# Patient Record
Sex: Female | Born: 1940
Health system: Southern US, Community
[De-identification: ages and names within clinical notes are randomized; demographics above are authoritative.]

## PROBLEM LIST (undated history)

## (undated) DIAGNOSIS — K5730 Diverticulosis of large intestine without perforation or abscess without bleeding: Secondary | ICD-10-CM

## (undated) DIAGNOSIS — C73 Malignant neoplasm of thyroid gland: Secondary | ICD-10-CM

## (undated) DIAGNOSIS — E039 Hypothyroidism, unspecified: Secondary | ICD-10-CM

## (undated) DIAGNOSIS — I10 Essential (primary) hypertension: Secondary | ICD-10-CM

## (undated) DIAGNOSIS — K219 Gastro-esophageal reflux disease without esophagitis: Secondary | ICD-10-CM

## (undated) DIAGNOSIS — E785 Hyperlipidemia, unspecified: Secondary | ICD-10-CM

## (undated) DIAGNOSIS — K589 Irritable bowel syndrome without diarrhea: Secondary | ICD-10-CM

## (undated) DIAGNOSIS — Z86010 Personal history of colonic polyps: Secondary | ICD-10-CM

## (undated) DIAGNOSIS — I739 Peripheral vascular disease, unspecified: Secondary | ICD-10-CM

## (undated) HISTORY — DX: Essential (primary) hypertension: I10

## (undated) HISTORY — PX: ESOPHAGOGASTRODUODENOSCOPY: SHX1529

## (undated) HISTORY — DX: Diverticulosis of large intestine without perforation or abscess without bleeding: K57.30

## (undated) HISTORY — DX: Hypothyroidism, unspecified: E03.9

## (undated) HISTORY — DX: Gastro-esophageal reflux disease without esophagitis: K21.9

## (undated) HISTORY — DX: Personal history of colonic polyps: Z86.010

## (undated) HISTORY — DX: Irritable bowel syndrome, unspecified: K58.9

## (undated) HISTORY — DX: Hyperlipidemia, unspecified: E78.5

## (undated) HISTORY — DX: Peripheral vascular disease, unspecified: I73.9

## (undated) HISTORY — DX: Malignant neoplasm of thyroid gland: C73

---

## 1989-03-06 HISTORY — PX: ABDOMINAL HYSTERECTOMY: SHX81

## 1997-09-02 ENCOUNTER — Other Ambulatory Visit: Admission: RE | Admit: 1997-09-02 | Discharge: 1997-09-02 | Payer: Self-pay | Admitting: Obstetrics and Gynecology

## 1998-11-25 ENCOUNTER — Other Ambulatory Visit: Admission: RE | Admit: 1998-11-25 | Discharge: 1998-11-25 | Payer: Self-pay | Admitting: Obstetrics and Gynecology

## 1999-03-07 DIAGNOSIS — Z860100 Personal history of colon polyps, unspecified: Secondary | ICD-10-CM

## 1999-03-07 HISTORY — DX: Personal history of colonic polyps: Z86.010

## 1999-03-07 HISTORY — DX: Personal history of colon polyps, unspecified: Z86.0100

## 1999-03-22 ENCOUNTER — Encounter (INDEPENDENT_AMBULATORY_CARE_PROVIDER_SITE_OTHER): Payer: Self-pay | Admitting: Specialist

## 1999-03-22 ENCOUNTER — Other Ambulatory Visit: Admission: RE | Admit: 1999-03-22 | Discharge: 1999-03-22 | Payer: Self-pay | Admitting: Gastroenterology

## 2000-01-16 ENCOUNTER — Other Ambulatory Visit: Admission: RE | Admit: 2000-01-16 | Discharge: 2000-01-16 | Payer: Self-pay | Admitting: Obstetrics and Gynecology

## 2004-05-11 ENCOUNTER — Ambulatory Visit: Payer: Self-pay | Admitting: Gastroenterology

## 2004-07-01 ENCOUNTER — Ambulatory Visit: Payer: Self-pay | Admitting: Gastroenterology

## 2004-11-01 ENCOUNTER — Ambulatory Visit: Payer: Self-pay | Admitting: Cardiology

## 2005-03-13 ENCOUNTER — Ambulatory Visit: Payer: Self-pay | Admitting: Internal Medicine

## 2005-03-17 ENCOUNTER — Ambulatory Visit: Payer: Self-pay | Admitting: Internal Medicine

## 2005-09-29 ENCOUNTER — Ambulatory Visit: Payer: Self-pay | Admitting: Internal Medicine

## 2005-10-24 ENCOUNTER — Ambulatory Visit: Payer: Self-pay

## 2006-01-03 ENCOUNTER — Ambulatory Visit: Payer: Self-pay | Admitting: Internal Medicine

## 2006-01-31 ENCOUNTER — Ambulatory Visit: Payer: Self-pay | Admitting: Internal Medicine

## 2006-01-31 LAB — CONVERTED CEMR LAB
Crystals: NEGATIVE
Ketones, ur: NEGATIVE mg/dL
Specific Gravity, Urine: <=1.005 (ref 1.000–1.03)
Urine Glucose: NEGATIVE mg/dL
Urobilinogen, UA: 0.2 (ref 0.0–1.0)
pH: 6 (ref 5.0–8.0)

## 2006-04-23 ENCOUNTER — Ambulatory Visit: Payer: Self-pay | Admitting: Internal Medicine

## 2006-06-15 ENCOUNTER — Ambulatory Visit: Payer: Self-pay | Admitting: Cardiology

## 2006-06-26 ENCOUNTER — Ambulatory Visit: Payer: Self-pay

## 2006-06-27 ENCOUNTER — Ambulatory Visit: Payer: Self-pay | Admitting: Gastroenterology

## 2006-07-05 ENCOUNTER — Ambulatory Visit (HOSPITAL_COMMUNITY): Admission: RE | Admit: 2006-07-05 | Discharge: 2006-07-05 | Payer: Self-pay | Admitting: Gastroenterology

## 2006-07-12 ENCOUNTER — Ambulatory Visit: Payer: Self-pay | Admitting: Cardiology

## 2006-08-02 ENCOUNTER — Encounter (INDEPENDENT_AMBULATORY_CARE_PROVIDER_SITE_OTHER): Payer: Self-pay | Admitting: Gastroenterology

## 2006-08-02 ENCOUNTER — Ambulatory Visit: Payer: Self-pay | Admitting: Gastroenterology

## 2007-05-02 ENCOUNTER — Encounter: Payer: Self-pay | Admitting: Internal Medicine

## 2007-05-30 ENCOUNTER — Ambulatory Visit: Payer: Self-pay | Admitting: Internal Medicine

## 2007-05-30 DIAGNOSIS — E785 Hyperlipidemia, unspecified: Secondary | ICD-10-CM

## 2007-05-30 DIAGNOSIS — I739 Peripheral vascular disease, unspecified: Secondary | ICD-10-CM

## 2007-05-30 DIAGNOSIS — K219 Gastro-esophageal reflux disease without esophagitis: Secondary | ICD-10-CM

## 2007-05-30 DIAGNOSIS — I10 Essential (primary) hypertension: Secondary | ICD-10-CM | POA: Insufficient documentation

## 2007-05-31 LAB — CONVERTED CEMR LAB
AST: 25 units/L (ref 0–37)
Albumin: 4 g/dL (ref 3.5–5.2)
Alkaline Phosphatase: 55 units/L (ref 39–117)
BUN: 9 mg/dL (ref 6–23)
Chloride: 103 meq/L (ref 96–112)
Cholesterol: 126 mg/dL (ref 0–200)
GFR calc non Af Amer: 76 mL/min
Glucose, Bld: 100 mg/dL — ABNORMAL HIGH (ref 70–99)
Potassium: 4.2 meq/L (ref 3.5–5.1)
Total Protein: 6.8 g/dL (ref 6.0–8.3)

## 2007-06-20 ENCOUNTER — Encounter: Payer: Self-pay | Admitting: Internal Medicine

## 2007-11-19 ENCOUNTER — Ambulatory Visit: Payer: Self-pay | Admitting: Internal Medicine

## 2007-11-19 ENCOUNTER — Ambulatory Visit: Payer: Self-pay

## 2007-11-26 ENCOUNTER — Ambulatory Visit: Payer: Self-pay | Admitting: Internal Medicine

## 2008-03-06 HISTORY — PX: COLONOSCOPY: SHX174

## 2008-03-06 HISTORY — PX: THYROIDECTOMY: SHX17

## 2008-03-19 ENCOUNTER — Ambulatory Visit (HOSPITAL_COMMUNITY): Admission: RE | Admit: 2008-03-19 | Discharge: 2008-03-20 | Payer: Self-pay | Admitting: Obstetrics and Gynecology

## 2008-05-12 ENCOUNTER — Encounter: Payer: Self-pay | Admitting: Internal Medicine

## 2008-05-13 ENCOUNTER — Encounter: Payer: Self-pay | Admitting: Internal Medicine

## 2008-05-27 ENCOUNTER — Ambulatory Visit: Payer: Self-pay | Admitting: Internal Medicine

## 2008-07-30 ENCOUNTER — Encounter: Payer: Self-pay | Admitting: Internal Medicine

## 2008-11-24 ENCOUNTER — Ambulatory Visit: Payer: Self-pay | Admitting: Internal Medicine

## 2008-11-24 DIAGNOSIS — Z860100 Personal history of colon polyps, unspecified: Secondary | ICD-10-CM | POA: Insufficient documentation

## 2008-11-26 LAB — CONVERTED CEMR LAB
Albumin: 4.3 g/dL (ref 3.5–5.2)
BUN: 10 mg/dL (ref 6–23)
Basophils Absolute: 0 10*3/uL (ref 0.0–0.1)
Bilirubin Urine: NEGATIVE
CO2: 29 meq/L (ref 19–32)
Chloride: 106 meq/L (ref 96–112)
Cholesterol: 213 mg/dL — ABNORMAL HIGH (ref 0–200)
Eosinophils Absolute: 0.1 10*3/uL (ref 0.0–0.7)
GFR calc non Af Amer: 75.81 mL/min (ref >60)
Glucose, Bld: 101 mg/dL — ABNORMAL HIGH (ref 70–99)
HCT: 43.9 % (ref 36.0–46.0)
HDL: 39.6 mg/dL (ref >39.00)
Lymphs Abs: 1.5 10*3/uL (ref 0.7–4.0)
MCHC: 33.7 g/dL (ref 30.0–36.0)
MCV: 89.7 fL (ref 78.0–100.0)
Monocytes Absolute: 0.4 10*3/uL (ref 0.1–1.0)
Neutro Abs: 4.1 10*3/uL (ref 1.4–7.7)
Nitrite: NEGATIVE
Platelets: 163 10*3/uL (ref 150.0–400.0)
Potassium: 4.2 meq/L (ref 3.5–5.1)
RDW: 13 % (ref 11.5–14.6)
Total Protein, Urine: NEGATIVE mg/dL
Total Protein: 7.2 g/dL (ref 6.0–8.3)
Urine Glucose: NEGATIVE mg/dL
VLDL: 25 mg/dL (ref 0.0–40.0)
Vitamin B-12: 547 pg/mL (ref 211–911)
pH: 6.5 (ref 5.0–8.0)

## 2008-12-02 DIAGNOSIS — Z85850 Personal history of malignant neoplasm of thyroid: Secondary | ICD-10-CM | POA: Insufficient documentation

## 2008-12-25 ENCOUNTER — Ambulatory Visit: Payer: Self-pay | Admitting: Internal Medicine

## 2009-01-05 ENCOUNTER — Ambulatory Visit: Payer: Self-pay | Admitting: Internal Medicine

## 2009-01-05 LAB — HM COLONOSCOPY

## 2009-03-06 DIAGNOSIS — C73 Malignant neoplasm of thyroid gland: Secondary | ICD-10-CM

## 2009-03-06 HISTORY — DX: Malignant neoplasm of thyroid gland: C73

## 2009-05-13 ENCOUNTER — Encounter: Payer: Self-pay | Admitting: Internal Medicine

## 2009-08-06 ENCOUNTER — Encounter: Payer: Self-pay | Admitting: Internal Medicine

## 2009-12-23 ENCOUNTER — Encounter: Payer: Self-pay | Admitting: Internal Medicine

## 2010-03-16 ENCOUNTER — Encounter: Payer: Self-pay | Admitting: Internal Medicine

## 2010-04-03 LAB — CONVERTED CEMR LAB
ALT: 32 units/L (ref 0–35)
Bilirubin, Direct: 0.1 mg/dL (ref 0.0–0.3)
CO2: 28 meq/L (ref 19–32)
Calcium: 8.9 mg/dL (ref 8.4–10.5)
GFR calc non Af Amer: 76 mL/min
HDL: 36.9 mg/dL — ABNORMAL LOW (ref >39.0)
Sodium: 142 meq/L (ref 135–145)
Total Bilirubin: 0.7 mg/dL (ref 0.3–1.2)
Total CHOL/HDL Ratio: 5.2

## 2010-04-07 NOTE — Letter (Signed)
Summary: [facility]   Imported By: Sherian Rein 09/08/2009 15:03:13  _____________________________________________________________________  External Attachment:    Type:   Image     Comment:   External Document

## 2010-04-07 NOTE — Letter (Signed)
Summary: [facility]   Imported By: Sherian Rein 01/20/2010 10:40:55  _____________________________________________________________________  External Attachment:    Type:   Image     Comment:   External Document

## 2010-05-09 ENCOUNTER — Other Ambulatory Visit: Payer: Medicare Other

## 2010-05-09 ENCOUNTER — Encounter (INDEPENDENT_AMBULATORY_CARE_PROVIDER_SITE_OTHER): Payer: Self-pay | Admitting: *Deleted

## 2010-05-09 ENCOUNTER — Other Ambulatory Visit: Payer: Self-pay | Admitting: Internal Medicine

## 2010-05-09 DIAGNOSIS — Z0000 Encounter for general adult medical examination without abnormal findings: Secondary | ICD-10-CM

## 2010-05-09 DIAGNOSIS — E039 Hypothyroidism, unspecified: Secondary | ICD-10-CM

## 2010-05-09 DIAGNOSIS — E785 Hyperlipidemia, unspecified: Secondary | ICD-10-CM

## 2010-05-09 DIAGNOSIS — I10 Essential (primary) hypertension: Secondary | ICD-10-CM

## 2010-05-09 LAB — CBC WITH DIFFERENTIAL/PLATELET
Basophils Relative: 0.3 % (ref 0.0–3.0)
Eosinophils Relative: 1.4 % (ref 0.0–5.0)
HCT: 39 % (ref 36.0–46.0)
Hemoglobin: 13.6 g/dL (ref 12.0–15.0)
Lymphs Abs: 0.7 10*3/uL (ref 0.7–4.0)
MCV: 90.7 fl (ref 78.0–100.0)
Monocytes Absolute: 0.4 10*3/uL (ref 0.1–1.0)
Monocytes Relative: 9.2 % (ref 3.0–12.0)
Neutro Abs: 3.1 10*3/uL (ref 1.4–7.7)
RBC: 4.3 Mil/uL (ref 3.87–5.11)
WBC: 4.2 10*3/uL — ABNORMAL LOW (ref 4.5–10.5)

## 2010-05-09 LAB — URINALYSIS
Bilirubin Urine: NEGATIVE
Ketones, ur: NEGATIVE
Leukocytes, UA: NEGATIVE
pH: 6 (ref 5.0–8.0)

## 2010-05-09 LAB — TSH: TSH: 0.02 u[IU]/mL — ABNORMAL LOW (ref 0.35–5.50)

## 2010-05-09 LAB — BASIC METABOLIC PANEL
BUN: 11 mg/dL (ref 6–23)
Chloride: 103 mEq/L (ref 96–112)
GFR: 88.06 mL/min (ref >60.00)
Potassium: 4.1 mEq/L (ref 3.5–5.1)
Sodium: 138 mEq/L (ref 135–145)

## 2010-05-09 LAB — LIPID PANEL
Cholesterol: 182 mg/dL (ref 0–200)
LDL Cholesterol: 122 mg/dL — ABNORMAL HIGH (ref 0–99)
VLDL: 20.8 mg/dL (ref 0.0–40.0)

## 2010-05-09 LAB — HEPATIC FUNCTION PANEL
ALT: 26 U/L (ref 0–35)
AST: 28 U/L (ref 0–37)
Albumin: 4.1 g/dL (ref 3.5–5.2)
Total Bilirubin: 0.9 mg/dL (ref 0.3–1.2)
Total Protein: 6.7 g/dL (ref 6.0–8.3)

## 2010-05-13 ENCOUNTER — Encounter (INDEPENDENT_AMBULATORY_CARE_PROVIDER_SITE_OTHER): Payer: Medicare Other | Admitting: Internal Medicine

## 2010-05-13 ENCOUNTER — Encounter: Payer: Self-pay | Admitting: Internal Medicine

## 2010-05-13 DIAGNOSIS — E039 Hypothyroidism, unspecified: Secondary | ICD-10-CM | POA: Insufficient documentation

## 2010-05-13 DIAGNOSIS — Z0000 Encounter for general adult medical examination without abnormal findings: Secondary | ICD-10-CM

## 2010-05-24 NOTE — Assessment & Plan Note (Signed)
Summary: yearly exam   Vital Signs:  Patient profile:   70 year old female Height:      66 inches Weight:      159 pounds BMI:     25.76 Temp:     98.3 degrees F oral Pulse rate:   76 / minute Pulse rhythm:   regular Resp:     16 per minute BP sitting:   108 / 52  (left arm) Cuff size:   regular  Vitals Entered By: Lanier Prude, Beverly Gust) (May 13, 2010 2:11 PM) CC: MWV Is Patient Diabetic? No   CC:  MWV.  History of Present Illness: The patient presents for a preventive health examination  Patient past medical history, social history, and family history reviewed in detail no significant changes.  Patient is physically active. Depression is negative and mood is good. Hearing is normal, and able to perform activities of daily living. Risk of falling is negligible and home safety has been reviewed and is appropriate. Patient has normal height, weight, and her visual acuity is good w/glasses. Patient has been counseled on age-appropriate routine health concerns for screening and prevention. Education, counseling done. Cognition is nl  Preventive Screening-Counseling & Management  Alcohol-Tobacco     Alcohol drinks/day: 0     Smoking Status: quit     Year Started: 1960     Year Quit: 1968  Caffeine-Diet-Exercise     Caffeine use/day: 4     Does Patient Exercise: yes     Type of exercise: walking     Exercise (avg: min/session): 3     Depression Counseling: not indicated; screening negative for depression  Hep-HIV-STD-Contraception     Hepatitis Risk: no risk noted     Dental Visit-last 6 months yes     SBE monthly: yes     Sun Exposure-Excessive: no  Safety-Violence-Falls     Seat Belt Use: yes     Helmet Use: n/a     Firearms in the Home: no firearms in the home     Smoke Detectors: yes     Violence in the Home: no risk noted     Sexual Abuse: no     Fall Risk: none      Sexual History:  currently monogamous.    Current Medications (verified): 1)  Baby  Aspirin 81 Mg  Chew (Aspirin) 2)  Atenolol 25 Mg  Tabs (Atenolol) .... Take 1 Tablet By Mouth Once A Day 3)  Vitamin B-12 1000 Mcg  Tabs (Cyanocobalamin) 4)  Vitamin D3 1000 Unit  Tabs (Cholecalciferol) .Marland Kitchen.. 1 By Mouth Daily 5)  Prevacid 30 Mg Cpdr (Lansoprazole) .... One By Mouth Daily 6)  Lovaza 1 Gm Caps (Omega-3-Acid Ethyl Esters) .... Once Daily 7)  Crestor 20 Mg Tabs (Rosuvastatin Calcium) .Marland Kitchen.. 1 Tablet By Mouth Daily 8)  Synthroid 175 Mcg Tabs (Levothyroxine Sodium) .Marland Kitchen.. 1 By Mouth Once Daily  Allergies (verified): 1)  ! * Flu Vaccination  Past History:  Family History: Last updated: 05/30/2007 Family History Hypertension  Social History: Last updated: 11/24/2008  Married Former Smoker Retired at 72  Past Medical History: Hyperlipidemia Peripheral vascular disease Hypertension GERD GYN Michelle Colonic polyps, hx of Hypothyroidism 2011 Thyroid cancer 2011  Past Surgical History: Thyroidectomy and 131I treatment 2011 at [facility]  Social History: Caffeine use/day:  4 Does Patient Exercise:  yes Dental Care w/in 6 mos.:  yes Sun Exposure-Excessive:  no Seat Belt Use:  yes Fall Risk:  none Hepatitis Risk:  no risk noted Sexual History:  currently monogamous  Review of Systems  The patient denies anorexia, fever, weight loss, weight gain, vision loss, decreased hearing, hoarseness, chest pain, syncope, dyspnea on exertion, peripheral edema, prolonged cough, headaches, hemoptysis, abdominal pain, melena, hematochezia, severe indigestion/heartburn, hematuria, incontinence, genital sores, muscle weakness, suspicious skin lesions, transient blindness, difficulty walking, depression, unusual weight change, abnormal bleeding, enlarged lymph nodes, angioedema, and breast masses.    Physical Exam  General:  Well-developed,well-nourished,in no acute distress; alert,appropriate and cooperative throughout examination Eyes:  No corneal or conjunctival  inflammation noted. EOMI. Perrla. Funduscopic exam benign, without hemorrhages, exudates or papilledema. Vision grossly normal. Nose:  External nasal examination shows no deformity or inflammation. Nasal mucosa are pink and moist without lesions or exudates. Mouth:  Erythematous throat mucosa and intranasal erythema.  Neck:  No deformities, masses, or tenderness noted. Lungs:  Normal respiratory effort, chest expands symmetrically. Lungs are clear to auscultation, no crackles or wheezes. Heart:  RRR Abdomen:  Bowel sounds positive,abdomen soft and non-tender without masses, organomegaly or hernias noted. Msk:  No deformity or scoliosis noted of thoracic or lumbar spine.   Extremities:  No clubbing, cyanosis, edema, or deformity noted with normal full range of motion of all joints.   Neurologic:  No cranial nerve deficits noted. Station and gait are normal. Plantar reflexes are down-going bilaterally. DTRs are symmetrical throughout. Sensory, motor and coordinative functions appear intact. Skin:  Intact without suspicious lesions or rashes Psych:  Cognition and judgment appear intact. Alert and cooperative with normal attention span and concentration. No apparent delusions, illusions, hallucinations   Impression & Recommendations:  Problem # 1:  PHYSICAL EXAMINATION (ICD-V70.0) Assessment New  Overall doing well, age appropriate education and counseling updated and referral for appropriate preventive services done unless declined, immunizations up to date or declined, diet counseling done if overweight, urged to quit smoking if smokes, most recent labs reviewed and current ordered if appropriate, ecg reviewed or declined (interpretation per ECG scanned in the EMR if done); information regarding Medicare Preventation requirements given if appropriate.  I have personally reviewed the Medicare Annual Wellness questionnaire and have noted 1.   The patient's medical and social history 2.   Their use  of alcohol, tobacco or illicit drugs 3.   Their current medications and supplements 4.   The patient's functional ability including ADL's, fall risks, home safety risks and hearing or visual             impairment. 5.   Diet and physical activities 6.   Evidence for depression or mood disorders  The patients weight, height, BMI and visual acuity have been recorded in the chart I have made referrals, counseling and provided education to the patient based review of the above and I have provided the pt with a written personalized care plan for preventive services.    Orders: Medicare -1st Annual Wellness Visit [phone])  Problem # 2:  HYPOTHYROIDISM (ICD-244.9) Assessment: New  The following medications were removed from the medication list:    Synthroid 175 Mcg Tabs (Levothyroxine sodium) .Marland Kitchen... 1 by mouth once daily Her updated medication list for this problem includes:    Synthroid 125 Mcg Tabs (Levothyroxine sodium) .Marland Kitchen... 1 by mouth qd  Problem # 3:  HYPERTENSION (ICD-401.9) Assessment: Improved  Her updated medication list for this problem includes:    Atenolol 25 Mg Tabs (Atenolol) .Marland Kitchen... Take 1 tablet by mouth once a day  Problem # 4:  GERD (ICD-530.81) Assessment: Unchanged  Her  updated medication list for this problem includes:    Prevacid 30 Mg Cpdr (Lansoprazole) ..... One by mouth daily  Complete Medication List: 1)  Baby Aspirin 81 Mg Chew (Aspirin) 2)  Atenolol 25 Mg Tabs (Atenolol) .... Take 1 tablet by mouth once a day 3)  Vitamin B-12 1000 Mcg Tabs (Cyanocobalamin) 4)  Vitamin D3 1000 Unit Tabs (Cholecalciferol) .Marland Kitchen.. 1 by mouth daily 5)  Prevacid 30 Mg Cpdr (Lansoprazole) .... One by mouth daily 6)  Lovaza 1 Gm Caps (Omega-3-acid ethyl esters) .... Once daily 7)  Crestor 20 Mg Tabs (Rosuvastatin calcium) .Marland Kitchen.. 1 tablet by mouth daily 8)  Synthroid 125 Mcg Tabs (Levothyroxine sodium) .Marland Kitchen.. 1 by mouth qd  Patient Instructions: 1)  Please schedule a follow-up  appointment in 2 months. 2)  TSH prior to visit, ICD-9:244.80 Prescriptions: CRESTOR 20 MG TABS (ROSUVASTATIN CALCIUM) 1 tablet by mouth daily  #30 x 11   Entered and Authorized by:   Tresa Garter MD   Signed by:   Tresa Garter MD on 05/19/2010   Method used:   Print then Give to Patient   RxID:   [id number] SYNTHROID 125 MCG TABS (LEVOTHYROXINE SODIUM) 1 by mouth qd  #30 x 6   Entered and Authorized by:   Tresa Garter MD   Signed by:   Tresa Garter MD on 05/19/2010   Method used:   Print then Give to Patient   RxID:   [id number]    Orders Added: 1)  Medicare -1st Annual Wellness Visit [G0438] 2)  Est. Patient Level III [32440]   Zostavax (to be given today)

## 2010-05-30 ENCOUNTER — Encounter: Payer: Self-pay | Admitting: Internal Medicine

## 2010-06-20 LAB — CBC
Hemoglobin: 14.1 g/dL (ref 12.0–15.0)
MCHC: 33.7 g/dL (ref 30.0–36.0)
MCHC: 33.9 g/dL (ref 30.0–36.0)
Platelets: 171 10*3/uL (ref 150–400)
RBC: 4.07 MIL/uL (ref 3.87–5.11)
RDW: 13.1 % (ref 11.5–15.5)
RDW: 13.2 % (ref 11.5–15.5)

## 2010-07-05 ENCOUNTER — Other Ambulatory Visit: Payer: Medicare Other

## 2010-07-05 ENCOUNTER — Other Ambulatory Visit (INDEPENDENT_AMBULATORY_CARE_PROVIDER_SITE_OTHER): Payer: Medicare Other

## 2010-07-05 DIAGNOSIS — E038 Other specified hypothyroidism: Secondary | ICD-10-CM

## 2010-07-05 LAB — TSH: TSH: 0.02 u[IU]/mL — ABNORMAL LOW (ref 0.35–5.50)

## 2010-07-06 ENCOUNTER — Other Ambulatory Visit: Payer: Self-pay | Admitting: Internal Medicine

## 2010-07-06 DIAGNOSIS — E038 Other specified hypothyroidism: Secondary | ICD-10-CM

## 2010-07-13 ENCOUNTER — Ambulatory Visit: Payer: Medicare Other | Admitting: Internal Medicine

## 2010-07-19 NOTE — Assessment & Plan Note (Signed)
[facility]                            CARDIOLOGY OFFICE NOTE   HITZEMAN, Poppy D                         MRN:          [id number]  DATE:07/12/2006                            DOB:          02/25/1941    Ms. Colson comes back today to discuss her exertional chest tightness.  We performed a stress Myoview on June 26, 2006.  She exercised for 4  minutes and 31 seconds, reaching a peak heart rate of 141.  This is 91%  of her predicted maximum heart rate.  Met level achieved was 6.4.  Her  EF was 77%, no ischemia, normal wall motion.  She did have a short run  of PAT, which was asymptomatic.  She had no significant ST-segment  changes.   She is having an endoscopy with [name] next week.  She is under  a lot of stress with her father being ill and having to be put in a  facility.   Her meds are unchanged.  Her blood pressure is good today at 116/68, her pulse 61 and regular.  The rest of her exam in unchanged.   ASSESSMENT AND PLAN:  Ms. Lavalle is having noncardiac chest discomfort.  Much of her symptoms have gotten better on reflux therapy.  She will  follow up with [name].  I will plan on seeing her back on a  p.r.n. basis.  I have emphasized walking at least 3 hours a week for  stress reduction and therapeutic lifestyle changes.     Thomas C. Daleen Squibb, MD, [facility]  Electronically Signed    TCW/MedQ  DD: 07/12/2006  DT: 07/12/2006  Job #: [id number]   cc:   Ulyess Mort, MD

## 2010-07-19 NOTE — H&P (Signed)
NAMEMachele Mcgee, Jared                  ACCOUNT NO.:  [id number]   MEDICAL RECORD NO.:  [id number]          PATIENT TYPE:  AMB   LOCATION:  SDC                           FACILITY:  [facility]   PHYSICIAN:  Michelle L. Grewal, M.D.DATE OF BIRTH:  14-Jan-1941   DATE OF ADMISSION:  DATE OF DISCHARGE:                              HISTORY & PHYSICAL   This is a 70 year old gravida 2, para 2 who has had a hysterectomy and  presents today preop.  She has a known symptomatic cystocele.  She  reports that she feels a bulge in her vagina at times.  We had pessary  in the past and it did not work.  She has had a history of TAH with BSO  by Dr. Dewaine Conger in 1971.  She does have a history of UTIs and recently  last week came in complaining of strong odor in her urine and her urine  grew out E. Coli.  She has been placed on ciprofloxacin.  She has no  known allergies.   MEDICAL PROBLEMS:  She has none.   SURGERY:  TAH with BSO.   CURRENT MEDICATIONS:  1. Atenolol 25 mg.  2. Ciprofloxacin.  3. She is also on Lexapro 10 mg a day.   PHYSICAL EXAMINATION:  VITAL SIGNS:  Height 5 feet 5 inches, weight 169,  and BP 128/74.  LUNGS:  Clear to auscultation bilaterally.  CARDIAC:  Regular rate and rhythm.  BREASTS:  Soft and nontender.  No masses.  PELVIC:  She has a grade 2 cystocele noted.  Vaginal cuff is okay.  She  has good support posteriorly.  No pelvic masses.   IMPRESSION:  Symptomatic cystocele.   PLAN:  Anterior repair, possible posterior repair if she demonstrates a  lot of relaxation in the operating room.  Risks and benefits were  reviewed with the patient.  We will use a Foley catheter overnight as  well as vaginal packing overnight and plan for an overnight hospital  stay.  The risks and benefits were reviewed with the patient.  We will  continue ciprofloxacin while in the hospital.      Marcelino Duster L. Vincente Poli, M.D.  Electronically Signed     MLG/MEDQ  D:  03/16/2008  T:  03/17/2008  Job:   [id number]

## 2010-07-19 NOTE — Op Note (Signed)
NAMETerecia Mcgee, [name]                  ACCOUNT NO.:  [id number]   MEDICAL RECORD NO.:  [id number]          PATIENT TYPE:  OIB   LOCATION:  9302                          FACILITY:  [facility]   PHYSICIAN:  Michelle L. Grewal, M.D.DATE OF BIRTH:  Oct 15, 1940   DATE OF PROCEDURE:  03/19/2008  DATE OF DISCHARGE:                               OPERATIVE REPORT   PREOPERATIVE DIAGNOSES:  Symptomatic cystocele and pelvic relaxation.   POSTOPERATIVE DIAGNOSES:  Symptomatic cystocele and pelvic relaxation.   PROCEDURE:  Anterior repair of incidental cystotomy and perineoplasty.   SURGEON:  Michelle L. Vincente Poli, MD   ANESTHESIA:  Spinal with LMA.   ESTIMATED BLOOD LOSS:  Less than 100 mL.   COMPLICATIONS:  Small incidental cystotomy repaired without difficulty.   PROCEDURE:  The patient was taken to the operating room where spinal was  placed without difficulty.  She was then given LMA as well for  additional anesthesia.  The patient was placed in lithotomy position.  Exam under anesthesia revealed the patient was status post hysterectomy.  She had a grade 2-3 cystocele.  She had some relaxation of the perineum,  but she had good support of the posterior vaginal wall.  She most likely  needed anterior repair and perineoplasty.  The patient was prepped and  draped and a Foley catheter was inserted.  The weighted speculum was  placed in the vagina.  The apex was identified at the vagina.  Allis  clamps were placed on either side.  A small transverse incision was made  with a scalpel.  A midline incision was made on the anterior repair  across the anterior vaginal vault with Metzenbaum scissors.  I did note  that there was significant scar tissue of the bladder to the vaginal  cuff.  In the bladder and that portion of where the scar tissue was, was  extremely translucent and very thin.  The cystocele was then gently  reduced using sharp and blunt dissection laterally.  With sharp  dissection with  Metzenbaum scissors, the scar tissues between the  bladder and the vaginal cuff was gently released.  During that time, I  noted what appeared to be a very tiny small cystotomy less than the tips  of my Metzenbaum scissors at this area.  I feel that it was the  cystotomy because there was some drainage of yellowish fluid, which  appeared to be consistent with urine.  The hole was so small, a hemostat  would not fit through this area, it was probably about 3-4 mm in length.  I then instructed the anesthesiologist to administer IV indigo carmine.  I then used 3-0 Vicryl suture and closed the defect completely with 2  layers with figure-of-eight stitch.  The indigo carmine was then given  and once the urine turned blue and there was absolutely no leakage  through this that I noted, the repair was completely watertight.  I feel  that the small cystotomy was probably because the bladder was extremely  translucent and thin because of the scar tissue in the area.  So I did  not take down the cystocele any further in the back because I was afraid  a larger cystotomy might occur because the bladder was so thin; however,  I was able to take it down pretty well laterally and then I reduced the  cystocele by reapproximating the tissue in the midline using 2-0 Vicryl  suture interrupted.  This was done in 1 layer, I then trimmed the  redundant vaginal tissue and closed the vaginal epithelium using 0-  Vicryl in a running locked stitch.  Hemostasis was excellent.  Reapproximation was very good.  At this point, inspection of posterior  vaginal wall noted that she had good support posteriorly and the vaginal  apex; however, she did have some relaxation of the perineum, and I  proceeded with a perineoplasty by making a small midline incision about  2.5 cm up the posterior vaginal vault reducing the defect with sharp  dissection and then placing re-enforcing stitch sutures using 0-Vicryl  suture.  I then  closed the vaginal epithelium posteriorly with a running  stitch using 0-Vicryl running stitch, then closed the perineum as well  with a subcuticular.  At the end of the procedure, no bleeding was  noted.  Vaginal packing with Estrace cream was inserted to the vagina.  Because of the small cystotomy and because of how thin her bladder was,  I feel that she needs to have complete decompression of her bladder for  least a week.  We will keep the Foley catheter in for 1 week and  maintain her on ciprofloxacin that were Floxin this well and I feel that  she will have excellent result.  All sponge, lap, and instrument counts  were correct x2.  She went to recovery room in stable condition.      Michelle L. Vincente Poli, M.D.  Electronically Signed     MLG/MEDQ  D:  03/19/2008  T:  03/20/2008  Job:  [id number]

## 2010-07-22 NOTE — Assessment & Plan Note (Signed)
[facility]                            CARDIOLOGY OFFICE NOTE   Mcgee, Lauren D                         MRN:          [id number]  DATE:06/15/2006                            DOB:          [birth year]    [name] comes to me today to establish as her cardiologist.  She has  seen [name] in the past.   She is best friends with [name], a patient of mine.   She carries a diagnosis of vasovagal syncope from abdominal pain.  She  has been on low-dose atenolol for this.  Had a stress Myoview February 03, 2002, which was normal with an EF of 67%.   She has also had carotid Dopplers, which showed 40-59% bilateral  internal carotid artery stenosis.  This was October 24, 2005.  These were  obtained by [name].  She is asymptomatic.   About the last week of March, she was sitting at home about 7 p.m. and  had substernal chest pressure.  She passed out for about 15 seconds.  She was clear when she returned to consciousness.  She was taken to  [facility] emergency room where she was diagnosed with GERD.  She  was placed on generic Protonix.   She has had some exertional chest tightness when she walks up hills.   CURRENT MEDICATIONS:  1. Atenolol 25 mg a day.  2. Aspirin 81 mg a day.  3. Protonix 40 mg a day.   EXAM:  She is extremely pleasant.  Looks younger than her stated age.  Blood pressure is 126/76, pulse 69 and regular.  EKG shows sinus rhythm  with no ST segment changes.  There are no conduction delays.  Weight is  162.  HEENT:  Normocephalic, atraumatic.  PERRLA.  Extraocular muscles are  intact.  Sclerae clear.  Facial symmetry is normal.  Dentition is  satisfactory.  NECK:  Supple.  Carotids are full with soft systolic sounds bilaterally.  Thyroid is not enlarged.  Trachea is midline.  LUNGS:  Clear.  HEART:  Nondisplaced PMI.  She has a normal S1, S2 without murmur, rub,  or gallop.  ABDOMEN:  Soft with good  bowel sounds.  No midline bruit.  There is no  hepatomegaly.  EXTREMITIES:  No cyanosis, clubbing, or edema.  Pulses are intact.  NEURO:  Intact.   [name] has had recurrent syncope from chest discomfort.  This is  most likely vasovagal as she has had before.  She probably does have  some reflux.  She is scheduled to see [name] for followup.  I  have told her to stay on her Protonix.   Her exertional dyspnea and chest tightness bothers me.  It has been 5  years since she has had a stress test.  She does have carotid disease.  We will obtain an exercise rest-stress Myoview to rule out any  substantial obstructive coronary artery disease.   She will need followup with me in about 3 to 4 weeks to discuss these  tests.  A  year from now, she will need carotid Dopplers.     Thomas C. Daleen Squibb, MD, [facility]  Electronically Signed    TCW/MedQ  DD: 06/15/2006  DT: 06/15/2006  Job #: [id number]

## 2010-07-22 NOTE — Assessment & Plan Note (Signed)
[facility]                             PRIMARY CARE OFFICE NOTE   Kylah, Maresh Yamira D                         MRN:          [id number]  DATE:01/03/2006                            DOB:          17-Feb-1941    The patient is a 70 year old female who presents for a new Medicare patient  wellness.   PAST MEDICAL HISTORY/FAMILY HISTORY/SOCIAL HISTORY:  As per March 17, 2005, note.  Her father is 37 years old, living.   CURRENT MEDICATIONS:  Fish oil and niacin.   ALLERGIES:  None.   REVIEW OF SYSTEMS:  No chest pain or shortness of breath.  No syncope.  No  neurologic complaints.  Occasional leg cramps.   PHYSICAL EXAMINATION:  VITAL SIGNS:  Blood pressure 119/68.  Pulse 61.  Temperature 97.7.  Weight 163 pounds.  GENERAL:  She is in no acute distress, looks well.  HEENT:  Moist mucosa.  NECK:  Supple.  No thyromegaly or bruits.  LUNGS:  There are no wheezes or rales.  HEART:  Normal S1, S2.  No murmurs, no gallops.  ABDOMEN:  Soft, nontender.  No hepatosplenomegaly or mass.  LOWER EXTREMITIES:  Without edema.  Calves nontender.  Good peripheral  pulse.  NEURO:  She is alert and oriented.  Denies being depressed.  SKIN:  Clear with aging changes.   LABS:  Not available at this time.   ASSESSMENT AND PLAN:  1. Normal well examination.  Obtain lab work appropriate for age.      Mammogram yearly.  Schedule yearly Pap with gynecologist.  2. Dyslipidemia.  Will try Zocor 20 mg daily, obtain lipids and liver in      two months.  3. Cramps from niacin.  She will discontinue.  4. Peripheral vascular disease with carotid artery with mild stenosis.      Continue with aspirin 81 mg daily.  I will see her back in 2-3 months.  5. Menopause.  Obtain vitamin D level.  Continue with calcium and vitamin      D.  Obtain bone density scan.    ______________________________  Georgina Quint. Plotnikov, MD    AVP/MedQ  DD: 01/04/2006  DT: 01/04/2006  Job #:  [id number]

## 2010-07-22 NOTE — Assessment & Plan Note (Signed)
[facility]                         GASTROENTEROLOGY OFFICE NOTE   Mcgee, Lauren Mcgee                         MRN:          [id number]  DATE:06/27/2006                            DOB:          1940-11-19    [name] comes in after going to the hospital in [place] and was diagnosed  with gastroesophageal reflux disease, pain. She suffered some severe  epigastric pain recently that caused a syncopal episode. The doctors  there thought it might be related to GERD. I told her that I had not  experienced GERD causing a syncopal episode like this unless it involved  severe pain. Maybe with some nutcracker type spasms or DES with severe  spasms, but again I have never had a patient pass out from this kind of  thing. She does have a history of syncope secondary to vasodepressor  syncope, which certainly is probably the culprit here. She did have an  episodes of SVT in the past. There has been a question of Bezold-Jarisch  reflux and therefore she should try to avoid recurrent episodes of  abdominal pain that might trigger this. This was suggested to her by Dr.  Andee Lineman. She continues on atenolol and Protonix. She said she would like  to have a colonoscopic examination at the same time and she was advised,  since her ultrasound did not show anything at the hospital in [place]  any pathology and that a HIDA scan would be in order. She says she has  lot of acid reflux and has had this for quite some time. She had a  Cardiolite study yesterday, she indicates was negative.   PHYSICAL EXAMINATION:  She weighed 162, blood pressure 112/54. Pulse 70  and regular.  NECK __________ are unremarkable.  She has no pain on palpation of her abdomen. She had only slight pain on  palpation of the epigastric region, none in the right upper quadrant.   IMPRESSION:  1. Epigastric pain lasting only 10 seconds, resulting in syncope of      questionable etiology.  2.  Gastroesophageal reflux disease. It is possible this is esophageal      spasm associated.  3. Diverticulosis. The patient has very elongated tortuous colon and      has been symptomatic from this in the past.  4. Vasodepressor syncope.  5. History of asymptomatic supraventricular tachycardia.  6. Irritable bowel syndrome.   RECOMMENDATIONS:  Dr. Andee Lineman has talked to the patient in the past, all  the way back to 2003, about Bezold-Jarisch reflux associated with  abdominal pain and has told her to try to eliminate her constipation as  much as possible and that she needs to prevent these situations from  occurring. Her pain was so fleeting and so severe that it is hard to  believe that it is associated with this syncope, but it certainly is  possible.   My recommendation is that she get a HIDA scan and be scheduled for an  EGD, but I do not think that a followup colonoscopic examination was in  order since it was last done in 2006.  I wrote down at that time that a 7  year followup was all that was needed.     Ulyess Mort, MD  Electronically Signed    SML/MedQ  DD: 06/27/2006  DT: 06/27/2006  Job #: [id number]   cc:   Learta Codding, MD,FACC

## 2010-08-15 ENCOUNTER — Ambulatory Visit: Payer: Medicare Other | Admitting: Internal Medicine

## 2010-08-15 ENCOUNTER — Ambulatory Visit: Payer: Medicare Other

## 2010-08-18 ENCOUNTER — Other Ambulatory Visit: Payer: Self-pay | Admitting: Internal Medicine

## 2010-08-26 ENCOUNTER — Ambulatory Visit: Payer: Medicare Other | Admitting: Internal Medicine

## 2010-10-11 ENCOUNTER — Other Ambulatory Visit: Payer: Self-pay | Admitting: Internal Medicine

## 2010-11-10 ENCOUNTER — Ambulatory Visit (INDEPENDENT_AMBULATORY_CARE_PROVIDER_SITE_OTHER): Payer: Medicare Other | Admitting: *Deleted

## 2010-11-10 DIAGNOSIS — Z23 Encounter for immunization: Secondary | ICD-10-CM

## 2010-11-10 DIAGNOSIS — Z2911 Encounter for prophylactic immunotherapy for respiratory syncytial virus (RSV): Secondary | ICD-10-CM

## 2011-01-04 ENCOUNTER — Other Ambulatory Visit: Payer: Self-pay | Admitting: Internal Medicine

## 2011-06-13 ENCOUNTER — Other Ambulatory Visit: Payer: Self-pay | Admitting: Internal Medicine

## 2011-06-14 ENCOUNTER — Telehealth: Payer: Self-pay | Admitting: Internal Medicine

## 2011-06-14 NOTE — Telephone Encounter (Signed)
Lvmom to schedule appt.

## 2011-06-20 ENCOUNTER — Encounter: Payer: Self-pay | Admitting: Internal Medicine

## 2011-06-20 ENCOUNTER — Ambulatory Visit (INDEPENDENT_AMBULATORY_CARE_PROVIDER_SITE_OTHER): Payer: Medicare Other | Admitting: Internal Medicine

## 2011-06-20 VITALS — BP 130/70 | HR 84 | Temp 98.4°F | Resp 16 | Wt 160.0 lb

## 2011-06-20 DIAGNOSIS — E785 Hyperlipidemia, unspecified: Secondary | ICD-10-CM

## 2011-06-20 DIAGNOSIS — R202 Paresthesia of skin: Secondary | ICD-10-CM

## 2011-06-20 DIAGNOSIS — I10 Essential (primary) hypertension: Secondary | ICD-10-CM

## 2011-06-20 DIAGNOSIS — Z0000 Encounter for general adult medical examination without abnormal findings: Secondary | ICD-10-CM

## 2011-06-20 DIAGNOSIS — I739 Peripheral vascular disease, unspecified: Secondary | ICD-10-CM

## 2011-06-20 DIAGNOSIS — E559 Vitamin D deficiency, unspecified: Secondary | ICD-10-CM

## 2011-06-20 DIAGNOSIS — R209 Unspecified disturbances of skin sensation: Secondary | ICD-10-CM

## 2011-06-20 DIAGNOSIS — E039 Hypothyroidism, unspecified: Secondary | ICD-10-CM

## 2011-06-20 MED ORDER — ATENOLOL 25 MG PO TABS: 25.0000 mg | ORAL_TABLET | Freq: Every day | ORAL | Status: DC

## 2011-06-20 NOTE — Assessment & Plan Note (Signed)
Continue with current prescription therapy as reflected on the Med list.  

## 2011-06-20 NOTE — Progress Notes (Signed)
  Subjective:    Patient ID: Lauren Mcgee, female    DOB: [birth year], 71 y.o.   MRN: [id number]  HPI  The patient presents for a follow-up of  chronic hypertension, chronic dyslipidemia, PVD, hypothyroidism controlled with medicines  Wt Readings from Last 3 Encounters:  06/20/11 160 lb (72.576 kg)  05/13/10 159 lb (72.122 kg)  11/24/08 167 lb (75.751 kg)   BP Readings from Last 3 Encounters:  06/20/11 130/70  05/13/10 108/52  11/24/08 126/78        Review of Systems  Constitutional: Negative for chills, activity change, appetite change, fatigue and unexpected weight change.  HENT: Negative for congestion, mouth sores and sinus pressure.   Eyes: Negative for visual disturbance.  Respiratory: Negative for cough and chest tightness.   Gastrointestinal: Negative for nausea and abdominal pain.  Genitourinary: Negative for frequency, difficulty urinating and vaginal pain.  Musculoskeletal: Negative for back pain and gait problem.  Skin: Negative for pallor and rash.  Neurological: Negative for dizziness, tremors, weakness, numbness and headaches.  Psychiatric/Behavioral: Negative for suicidal ideas, confusion and sleep disturbance.       Objective:   Physical Exam  Constitutional: She appears well-developed. No distress.  HENT:  Head: Normocephalic.  Right Ear: External ear normal.  Left Ear: External ear normal.  Nose: Nose normal.  Mouth/Throat: Oropharynx is clear and moist.  Eyes: Conjunctivae are normal. Pupils are equal, round, and reactive to light. Right eye exhibits no discharge. Left eye exhibits no discharge.  Neck: Normal range of motion. Neck supple. No JVD present. No tracheal deviation present. No thyromegaly present.  Cardiovascular: Normal rate, regular rhythm and normal heart sounds.   Pulmonary/Chest: No stridor. No respiratory distress. She has no wheezes.  Abdominal: Soft. Bowel sounds are normal. She exhibits no distension and no mass. There is no  tenderness. There is no rebound and no guarding.  Musculoskeletal: She exhibits no edema and no tenderness.  Lymphadenopathy:    She has no cervical adenopathy.  Neurological: She displays normal reflexes. No cranial nerve deficit. She exhibits normal muscle tone. Coordination normal.  Skin: No rash noted. No erythema.  Psychiatric: She has a normal mood and affect. Her behavior is normal. Judgment and thought content normal.     Lab Results  Component Value Date   WBC 4.2* 05/09/2010   HGB 13.6 05/09/2010   HCT 39.0 05/09/2010   PLT 145.0* 05/09/2010   GLUCOSE 90 05/09/2010   CHOL 182 05/09/2010   TRIG 104.0 05/09/2010   HDL 38.90* 05/09/2010   LDLDIRECT 158.1 11/24/2008   LDLCALC 122* 05/09/2010   ALT 26 05/09/2010   AST 28 05/09/2010   NA 138 05/09/2010   K 4.1 05/09/2010   CL 103 05/09/2010   CREATININE 0.7 05/09/2010   BUN 11 05/09/2010   CO2 27 05/09/2010   TSH 0.02* 07/05/2010        Assessment & Plan:

## 2011-10-24 DIAGNOSIS — R490 Dysphonia: Secondary | ICD-10-CM | POA: Insufficient documentation

## 2011-12-12 ENCOUNTER — Encounter: Payer: Medicare Other | Admitting: Internal Medicine

## 2011-12-12 ENCOUNTER — Other Ambulatory Visit: Payer: Self-pay | Admitting: Obstetrics and Gynecology

## 2011-12-12 ENCOUNTER — Telehealth: Payer: Self-pay | Admitting: *Deleted

## 2011-12-12 NOTE — Telephone Encounter (Signed)
Ok Thx 

## 2011-12-12 NOTE — Telephone Encounter (Signed)
Pt needs a referral for Dr. McDermott's office-her GYN Dr. Milton Ferguson referred her to Dr. Jacquelyne Balint and per Dr. McDermott's office she needs a referral from her PCP.

## 2012-01-31 ENCOUNTER — Other Ambulatory Visit (INDEPENDENT_AMBULATORY_CARE_PROVIDER_SITE_OTHER): Payer: Medicare Other

## 2012-01-31 DIAGNOSIS — E785 Hyperlipidemia, unspecified: Secondary | ICD-10-CM

## 2012-01-31 DIAGNOSIS — I10 Essential (primary) hypertension: Secondary | ICD-10-CM

## 2012-01-31 DIAGNOSIS — I739 Peripheral vascular disease, unspecified: Secondary | ICD-10-CM

## 2012-01-31 DIAGNOSIS — Z79899 Other long term (current) drug therapy: Secondary | ICD-10-CM

## 2012-01-31 DIAGNOSIS — Z0000 Encounter for general adult medical examination without abnormal findings: Secondary | ICD-10-CM

## 2012-01-31 DIAGNOSIS — R202 Paresthesia of skin: Secondary | ICD-10-CM

## 2012-01-31 DIAGNOSIS — E039 Hypothyroidism, unspecified: Secondary | ICD-10-CM

## 2012-01-31 LAB — HEPATIC FUNCTION PANEL
ALT: 42 U/L — ABNORMAL HIGH (ref 0–35)
AST: 36 U/L (ref 0–37)
Bilirubin, Direct: 0.1 mg/dL (ref 0.0–0.3)
Total Bilirubin: 0.8 mg/dL (ref 0.3–1.2)
Total Protein: 6.9 g/dL (ref 6.0–8.3)

## 2012-01-31 LAB — LIPID PANEL
Cholesterol: 171 mg/dL (ref 0–200)
HDL: 42.8 mg/dL (ref >39.00)

## 2012-01-31 LAB — URINALYSIS, ROUTINE W REFLEX MICROSCOPIC
Bilirubin Urine: NEGATIVE
Nitrite: NEGATIVE
Specific Gravity, Urine: 1.02 (ref 1.000–1.030)
pH: 6 (ref 5.0–8.0)

## 2012-01-31 LAB — CBC WITH DIFFERENTIAL/PLATELET
Basophils Absolute: 0 10*3/uL (ref 0.0–0.1)
Lymphocytes Relative: 23.5 % (ref 12.0–46.0)
Lymphs Abs: 1.3 10*3/uL (ref 0.7–4.0)
Monocytes Relative: 7.3 % (ref 3.0–12.0)
Neutrophils Relative %: 66.7 % (ref 43.0–77.0)
Platelets: 138 10*3/uL — ABNORMAL LOW (ref 150.0–400.0)
RDW: 13.3 % (ref 11.5–14.6)
WBC: 5.4 10*3/uL (ref 4.5–10.5)

## 2012-01-31 LAB — BASIC METABOLIC PANEL
BUN: 10 mg/dL (ref 6–23)
Calcium: 8.8 mg/dL (ref 8.4–10.5)
Creatinine, Ser: 0.7 mg/dL (ref 0.4–1.2)
GFR: 89.09 mL/min (ref >60.00)
Glucose, Bld: 110 mg/dL — ABNORMAL HIGH (ref 70–99)

## 2012-01-31 LAB — TSH: TSH: 0.03 u[IU]/mL — ABNORMAL LOW (ref 0.35–5.50)

## 2012-02-01 LAB — VITAMIN D 25 HYDROXY (VIT D DEFICIENCY, FRACTURES): Vit D, 25-Hydroxy: 46 ng/mL (ref 30–89)

## 2012-02-05 ENCOUNTER — Ambulatory Visit (INDEPENDENT_AMBULATORY_CARE_PROVIDER_SITE_OTHER): Payer: Medicare Other | Admitting: Internal Medicine

## 2012-02-05 ENCOUNTER — Encounter: Payer: Self-pay | Admitting: Internal Medicine

## 2012-02-05 VITALS — BP 140/70 | HR 80 | Temp 97.7°F | Resp 16 | Ht 66.0 in | Wt 162.0 lb

## 2012-02-05 DIAGNOSIS — E785 Hyperlipidemia, unspecified: Secondary | ICD-10-CM

## 2012-02-05 DIAGNOSIS — I739 Peripheral vascular disease, unspecified: Secondary | ICD-10-CM

## 2012-02-05 DIAGNOSIS — E039 Hypothyroidism, unspecified: Secondary | ICD-10-CM

## 2012-02-05 DIAGNOSIS — K219 Gastro-esophageal reflux disease without esophagitis: Secondary | ICD-10-CM

## 2012-02-05 DIAGNOSIS — Z0000 Encounter for general adult medical examination without abnormal findings: Secondary | ICD-10-CM

## 2012-02-05 DIAGNOSIS — I10 Essential (primary) hypertension: Secondary | ICD-10-CM

## 2012-02-05 MED ORDER — LEVOTHYROXINE SODIUM 150 MCG PO TABS: 150.0000 ug | ORAL_TABLET | Freq: Every day | ORAL | Status: DC

## 2012-02-05 MED ORDER — ROSUVASTATIN CALCIUM 10 MG PO TABS: 10.0000 mg | ORAL_TABLET | Freq: Every day | ORAL | Status: DC

## 2012-02-05 NOTE — Assessment & Plan Note (Addendum)
Will reduce current prescription to 150 mcg/d - she was on 175 mcg/d TSH in 6 wks

## 2012-02-05 NOTE — Assessment & Plan Note (Signed)
Restart  Crestor - ok to take qod

## 2012-02-05 NOTE — Assessment & Plan Note (Signed)
Continue with current prescription therapy as reflected on the Med list.  

## 2012-02-05 NOTE — Progress Notes (Signed)
Patient ID: Lauren Mcgee, female   DOB: 03-01-1941, 71 y.o.   MRN: [id number]  Subjective:    Patient ID: Lauren Mcgee, female    DOB: [birth year], 71 y.o.   MRN: [id number]  HPI  The patient presents for a follow-up of  chronic hypertension, chronic dyslipidemia, PVD, hypothyroidism controlled with medicines  Wt Readings from Last 3 Encounters:  02/05/12 162 lb (73.483 kg)  06/20/11 160 lb (72.576 kg)  05/13/10 159 lb (72.122 kg)   BP Readings from Last 3 Encounters:  02/05/12 140/70  06/20/11 130/70  05/13/10 108/52        Review of Systems  Constitutional: Negative for chills, activity change, appetite change, fatigue and unexpected weight change.  HENT: Negative for congestion, mouth sores and sinus pressure.   Eyes: Negative for visual disturbance.  Respiratory: Negative for cough and chest tightness.   Gastrointestinal: Negative for nausea and abdominal pain.  Genitourinary: Negative for frequency, difficulty urinating and vaginal pain.  Musculoskeletal: Negative for back pain and gait problem.  Skin: Negative for pallor and rash.  Neurological: Negative for dizziness, tremors, weakness, numbness and headaches.  Psychiatric/Behavioral: Negative for suicidal ideas, confusion and sleep disturbance.       Objective:   Physical Exam  Constitutional: She appears well-developed. No distress.  HENT:  Head: Normocephalic.  Right Ear: External ear normal.  Left Ear: External ear normal.  Nose: Nose normal.  Mouth/Throat: Oropharynx is clear and moist.  Eyes: Conjunctivae normal are normal. Pupils are equal, round, and reactive to light. Right eye exhibits no discharge. Left eye exhibits no discharge.  Neck: Normal range of motion. Neck supple. No JVD present. No tracheal deviation present. No thyromegaly present.  Cardiovascular: Normal rate, regular rhythm and normal heart sounds.   Pulmonary/Chest: No stridor. No respiratory distress. She has no wheezes.  Abdominal: Soft.  Bowel sounds are normal. She exhibits no distension and no mass. There is no tenderness. There is no rebound and no guarding.  Musculoskeletal: She exhibits no edema and no tenderness.  Lymphadenopathy:    She has no cervical adenopathy.  Neurological: She displays normal reflexes. No cranial nerve deficit. She exhibits normal muscle tone. Coordination normal.  Skin: No rash noted. No erythema.  Psychiatric: She has a normal mood and affect. Her behavior is normal. Judgment and thought content normal.     Lab Results  Component Value Date   WBC 5.4 01/31/2012   HGB 13.9 01/31/2012   HCT 41.7 01/31/2012   PLT 138.0* 01/31/2012   GLUCOSE 110* 01/31/2012   CHOL 171 01/31/2012   TRIG 91.0 01/31/2012   HDL 42.80 01/31/2012   LDLDIRECT 158.1 11/24/2008   LDLCALC 110* 01/31/2012   ALT 42* 01/31/2012   AST 36 01/31/2012   NA 140 01/31/2012   K 4.0 01/31/2012   CL 106 01/31/2012   CREATININE 0.7 01/31/2012   BUN 10 01/31/2012   CO2 28 01/31/2012   TSH 0.03* 01/31/2012        Assessment & Plan:

## 2012-02-05 NOTE — Assessment & Plan Note (Signed)
Statins discussed - she is ok to re-start crestor

## 2012-02-05 NOTE — Assessment & Plan Note (Signed)
Here for medicare wellness/physical  Diet: heart healthy  Physical activity: good Depression/mood screen: negative  Hearing: intact to whispered voice  Visual acuity: grossly normal, performs annual eye exam  ADLs: capable  Fall risk: none  Home safety: good  Cognitive evaluation: intact to orientation, naming, recall and repetition  EOL planning: adv directives, full code/ I agree  I have personally reviewed and have noted  1. The patient's medical and social history  2. Their use of alcohol, tobacco or illicit drugs  3. Their current medications and supplements  4. The patient's functional ability including ADL's, fall risks, home safety risks and hearing or visual impairment.  5. Diet and physical activities  6. Evidence for depression or mood disorders    Today patient counseled on age appropriate routine health concerns for screening and prevention, each reviewed and up to date or declined. Immunizations reviewed and up to date or declined. Labs ordered and reviewed. Risk factors for depression reviewed and negative. Hearing function and visual acuity are intact. ADLs screened and addressed as needed. Functional ability and level of safety reviewed and appropriate. Education, counseling and referrals performed based on assessed risks today. Patient provided with a copy of personalized plan for preventive services.  Declined a flu shot - allergic

## 2012-02-06 ENCOUNTER — Other Ambulatory Visit: Payer: Self-pay | Admitting: Cardiology

## 2012-02-06 DIAGNOSIS — I6529 Occlusion and stenosis of unspecified carotid artery: Secondary | ICD-10-CM

## 2012-02-15 ENCOUNTER — Ambulatory Visit (INDEPENDENT_AMBULATORY_CARE_PROVIDER_SITE_OTHER): Payer: Medicare Other

## 2012-02-15 DIAGNOSIS — I6529 Occlusion and stenosis of unspecified carotid artery: Secondary | ICD-10-CM

## 2012-02-20 ENCOUNTER — Telehealth: Payer: Self-pay | Admitting: Internal Medicine

## 2012-02-20 NOTE — Telephone Encounter (Signed)
Lauren Mcgee, please, inform patient that her carot [place] has a mild to moderate blockage - repeat testin 2 years; cont w/meds Thx

## 2012-02-21 NOTE — Telephone Encounter (Signed)
Pt informed

## 2012-04-10 ENCOUNTER — Encounter: Payer: Self-pay | Admitting: Internal Medicine

## 2012-04-10 ENCOUNTER — Ambulatory Visit (INDEPENDENT_AMBULATORY_CARE_PROVIDER_SITE_OTHER): Payer: Medicare Other | Admitting: Internal Medicine

## 2012-04-10 VITALS — BP 130/70 | HR 76 | Temp 98.2°F | Resp 16 | Wt 163.0 lb

## 2012-04-10 DIAGNOSIS — I739 Peripheral vascular disease, unspecified: Secondary | ICD-10-CM

## 2012-04-10 DIAGNOSIS — E538 Deficiency of other specified B group vitamins: Secondary | ICD-10-CM

## 2012-04-10 DIAGNOSIS — I10 Essential (primary) hypertension: Secondary | ICD-10-CM

## 2012-04-10 DIAGNOSIS — E785 Hyperlipidemia, unspecified: Secondary | ICD-10-CM

## 2012-04-10 NOTE — Progress Notes (Signed)
   Subjective:     HPI  C/o fatigue from Crestor - taking QOD The patient presents for a follow-up of  chronic hypertension, chronic dyslipidemia, PVD, hypothyroidism controlled with medicines  Wt Readings from Last 3 Encounters:  04/10/12 163 lb (73.936 kg)  02/05/12 162 lb (73.483 kg)  06/20/11 160 lb (72.576 kg)   BP Readings from Last 3 Encounters:  04/10/12 130/70  02/05/12 140/70  06/20/11 130/70        Review of Systems  Constitutional: Negative for chills, activity change, appetite change, fatigue and unexpected weight change.  HENT: Negative for congestion, mouth sores and sinus pressure.   Eyes: Negative for visual disturbance.  Respiratory: Negative for cough and chest tightness.   Gastrointestinal: Negative for nausea and abdominal pain.  Genitourinary: Negative for frequency, difficulty urinating and vaginal pain.  Musculoskeletal: Negative for back pain and gait problem.  Skin: Negative for pallor and rash.  Neurological: Negative for dizziness, tremors, weakness, numbness and headaches.  Psychiatric/Behavioral: Negative for suicidal ideas, confusion and sleep disturbance.       Objective:   Physical Exam  Constitutional: She appears well-developed. No distress.  HENT:  Head: Normocephalic.  Right Ear: External ear normal.  Left Ear: External ear normal.  Nose: Nose normal.  Mouth/Throat: Oropharynx is clear and moist.  Eyes: Conjunctivae normal are normal. Pupils are equal, round, and reactive to light. Right eye exhibits no discharge. Left eye exhibits no discharge.  Neck: Normal range of motion. Neck supple. No JVD present. No tracheal deviation present. No thyromegaly present.  Cardiovascular: Normal rate, regular rhythm and normal heart sounds.   Pulmonary/Chest: No stridor. No respiratory distress. She has no wheezes.  Abdominal: Soft. Bowel sounds are normal. She exhibits no distension and no mass. There is no tenderness. There is no rebound and  no guarding.  Musculoskeletal: She exhibits no edema and no tenderness.  Lymphadenopathy:    She has no cervical adenopathy.  Neurological: She displays normal reflexes. No cranial nerve deficit. She exhibits normal muscle tone. Coordination normal.  Skin: No rash noted. No erythema.  Psychiatric: She has a normal mood and affect. Her behavior is normal. Judgment and thought content normal.     Lab Results  Component Value Date   WBC 5.4 01/31/2012   HGB 13.9 01/31/2012   HCT 41.7 01/31/2012   PLT 138.0* 01/31/2012   GLUCOSE 110* 01/31/2012   CHOL 171 01/31/2012   TRIG 91.0 01/31/2012   HDL 42.80 01/31/2012   LDLDIRECT 158.1 11/24/2008   LDLCALC 110* 01/31/2012   ALT 42* 01/31/2012   AST 36 01/31/2012   NA 140 01/31/2012   K 4.0 01/31/2012   CL 106 01/31/2012   CREATININE 0.7 01/31/2012   BUN 10 01/31/2012   CO2 28 01/31/2012   TSH 0.03* 01/31/2012        Assessment & Plan:

## 2012-04-10 NOTE — Assessment & Plan Note (Signed)
Continue with current prescription therapy as reflected on the Med list.  

## 2012-04-10 NOTE — Assessment & Plan Note (Signed)
Reduce crestor to 5 mg q 2-3 d

## 2012-09-30 ENCOUNTER — Other Ambulatory Visit: Payer: Self-pay | Admitting: *Deleted

## 2012-09-30 DIAGNOSIS — I739 Peripheral vascular disease, unspecified: Secondary | ICD-10-CM

## 2012-09-30 DIAGNOSIS — E559 Vitamin D deficiency, unspecified: Secondary | ICD-10-CM

## 2012-09-30 DIAGNOSIS — E785 Hyperlipidemia, unspecified: Secondary | ICD-10-CM

## 2012-09-30 DIAGNOSIS — Z0000 Encounter for general adult medical examination without abnormal findings: Secondary | ICD-10-CM

## 2012-09-30 DIAGNOSIS — I10 Essential (primary) hypertension: Secondary | ICD-10-CM

## 2012-09-30 DIAGNOSIS — R202 Paresthesia of skin: Secondary | ICD-10-CM

## 2012-09-30 DIAGNOSIS — E039 Hypothyroidism, unspecified: Secondary | ICD-10-CM

## 2012-09-30 MED ORDER — ATENOLOL 25 MG PO TABS: 25.0000 mg | ORAL_TABLET | Freq: Every day | ORAL | Status: DC

## 2012-10-08 ENCOUNTER — Encounter: Payer: Self-pay | Admitting: Internal Medicine

## 2012-10-08 ENCOUNTER — Ambulatory Visit (INDEPENDENT_AMBULATORY_CARE_PROVIDER_SITE_OTHER): Payer: Medicare Other | Admitting: Internal Medicine

## 2012-10-08 VITALS — BP 120/68 | HR 80 | Temp 97.4°F | Resp 16 | Wt 157.0 lb

## 2012-10-08 DIAGNOSIS — I10 Essential (primary) hypertension: Secondary | ICD-10-CM

## 2012-10-08 DIAGNOSIS — E039 Hypothyroidism, unspecified: Secondary | ICD-10-CM

## 2012-10-08 DIAGNOSIS — E785 Hyperlipidemia, unspecified: Secondary | ICD-10-CM

## 2012-10-08 DIAGNOSIS — R7309 Other abnormal glucose: Secondary | ICD-10-CM

## 2012-10-08 DIAGNOSIS — R739 Hyperglycemia, unspecified: Secondary | ICD-10-CM

## 2012-10-08 NOTE — Assessment & Plan Note (Signed)
Continue with current prescription therapy as reflected on the Med list.  

## 2012-10-08 NOTE — Assessment & Plan Note (Signed)
A1c

## 2012-10-08 NOTE — Progress Notes (Signed)
   Subjective:     HPI  F/u fatigue from Crestor - taking QOD The patient presents for a follow-up of  chronic hypertension, chronic dyslipidemia, PVD, hypothyroidism controlled with medicines  Wt Readings from Last 3 Encounters:  10/08/12 157 lb (71.215 kg)  04/10/12 163 lb (73.936 kg)  02/05/12 162 lb (73.483 kg)   BP Readings from Last 3 Encounters:  10/08/12 120/68  04/10/12 130/70  02/05/12 140/70        Review of Systems  Constitutional: Negative for chills, activity change, appetite change, fatigue and unexpected weight change.  HENT: Negative for congestion, mouth sores and sinus pressure.   Eyes: Negative for visual disturbance.  Respiratory: Negative for cough and chest tightness.   Gastrointestinal: Negative for nausea and abdominal pain.  Genitourinary: Negative for frequency, difficulty urinating and vaginal pain.  Musculoskeletal: Negative for back pain and gait problem.  Skin: Negative for pallor and rash.  Neurological: Negative for dizziness, tremors, weakness, numbness and headaches.  Psychiatric/Behavioral: Negative for suicidal ideas, confusion and sleep disturbance.       Objective:   Physical Exam  Constitutional: She appears well-developed. No distress.  HENT:  Head: Normocephalic.  Right Ear: External ear normal.  Left Ear: External ear normal.  Nose: Nose normal.  Mouth/Throat: Oropharynx is clear and moist.  Eyes: Conjunctivae are normal. Pupils are equal, round, and reactive to light. Right eye exhibits no discharge. Left eye exhibits no discharge.  Neck: Normal range of motion. Neck supple. No JVD present. No tracheal deviation present. No thyromegaly present.  Cardiovascular: Normal rate, regular rhythm and normal heart sounds.   Pulmonary/Chest: No stridor. No respiratory distress. She has no wheezes.  Abdominal: Soft. Bowel sounds are normal. She exhibits no distension and no mass. There is no tenderness. There is no rebound and no  guarding.  Musculoskeletal: She exhibits no edema and no tenderness.  Lymphadenopathy:    She has no cervical adenopathy.  Neurological: She displays normal reflexes. No cranial nerve deficit. She exhibits normal muscle tone. Coordination normal.  Skin: No rash noted. No erythema.  Psychiatric: She has a normal mood and affect. Her behavior is normal. Judgment and thought content normal.     Lab Results  Component Value Date   WBC 5.4 01/31/2012   HGB 13.9 01/31/2012   HCT 41.7 01/31/2012   PLT 138.0* 01/31/2012   GLUCOSE 110* 01/31/2012   CHOL 171 01/31/2012   TRIG 91.0 01/31/2012   HDL 42.80 01/31/2012   LDLDIRECT 158.1 11/24/2008   LDLCALC 110* 01/31/2012   ALT 42* 01/31/2012   AST 36 01/31/2012   NA 140 01/31/2012   K 4.0 01/31/2012   CL 106 01/31/2012   CREATININE 0.7 01/31/2012   BUN 10 01/31/2012   CO2 28 01/31/2012   TSH 0.03* 01/31/2012        Assessment & Plan:

## 2012-10-08 NOTE — Assessment & Plan Note (Signed)
FT4 TSH 

## 2012-10-10 ENCOUNTER — Other Ambulatory Visit: Payer: Self-pay | Admitting: Internal Medicine

## 2012-10-10 ENCOUNTER — Ambulatory Visit (INDEPENDENT_AMBULATORY_CARE_PROVIDER_SITE_OTHER): Payer: Medicare Other | Admitting: *Deleted

## 2012-10-10 DIAGNOSIS — E538 Deficiency of other specified B group vitamins: Secondary | ICD-10-CM

## 2012-10-10 DIAGNOSIS — I10 Essential (primary) hypertension: Secondary | ICD-10-CM

## 2012-10-10 DIAGNOSIS — E785 Hyperlipidemia, unspecified: Secondary | ICD-10-CM

## 2012-10-10 DIAGNOSIS — R7309 Other abnormal glucose: Secondary | ICD-10-CM

## 2012-10-10 DIAGNOSIS — I739 Peripheral vascular disease, unspecified: Secondary | ICD-10-CM

## 2012-10-10 DIAGNOSIS — R739 Hyperglycemia, unspecified: Secondary | ICD-10-CM

## 2012-10-10 DIAGNOSIS — E039 Hypothyroidism, unspecified: Secondary | ICD-10-CM

## 2012-10-10 LAB — HEPATIC FUNCTION PANEL
ALT: 33 U/L (ref 0–35)
AST: 34 U/L (ref 0–37)
Albumin: 4.4 g/dL (ref 3.5–5.2)
Alkaline Phosphatase: 53 U/L (ref 39–117)
Total Protein: 7.3 g/dL (ref 6.0–8.3)

## 2012-10-10 LAB — HEMOGLOBIN A1C: Hgb A1c MFr Bld: 5.5 % (ref 4.6–6.5)

## 2012-10-10 LAB — CK: Total CK: 172 U/L (ref 7–177)

## 2012-10-11 MED ORDER — LEVOTHYROXINE SODIUM 137 MCG PO TABS: 137.0000 ug | ORAL_TABLET | Freq: Every day | ORAL | Status: DC

## 2012-10-14 ENCOUNTER — Telehealth: Payer: Self-pay | Admitting: Internal Medicine

## 2012-10-14 NOTE — Telephone Encounter (Signed)
I called pt and answered all her questions.  

## 2012-10-14 NOTE — Telephone Encounter (Signed)
Patient called and is hoping to discuss her synthroid medication.  She was offered an apt, but declined.  Her callback number is [phone]

## 2012-10-24 ENCOUNTER — Telehealth: Payer: Self-pay | Admitting: *Deleted

## 2012-10-24 DIAGNOSIS — E039 Hypothyroidism, unspecified: Secondary | ICD-10-CM

## 2012-10-24 NOTE — Telephone Encounter (Signed)
Ok to go back on previous dose. Is it ok forme to sch a consult w/thyroid specialist? Thx

## 2012-10-24 NOTE — Telephone Encounter (Signed)
Pt called states Synthroid was recently changed and new medication is causing hoarseness and cough.  Pt further states she did not take the medication today.  Please advise

## 2012-10-25 NOTE — Telephone Encounter (Signed)
Patient informed of recommendations and referral, voiced understanding

## 2012-10-25 NOTE — Telephone Encounter (Signed)
Ok to stay off med for a day or two I'll sch a consultation Thx

## 2012-10-25 NOTE — Telephone Encounter (Signed)
Informed husband, would like a referral to Thyroid specialist and would also like to know if she should stay off her medication for a day or two to help with the hoarseness before resuming her previous dose?

## 2012-11-29 ENCOUNTER — Ambulatory Visit: Payer: Medicare Other | Admitting: Internal Medicine

## 2012-12-02 ENCOUNTER — Encounter: Payer: Self-pay | Admitting: Internal Medicine

## 2012-12-02 ENCOUNTER — Ambulatory Visit (INDEPENDENT_AMBULATORY_CARE_PROVIDER_SITE_OTHER): Payer: Medicare Other | Admitting: Internal Medicine

## 2012-12-02 VITALS — BP 122/68 | HR 64 | Temp 97.7°F | Resp 10 | Ht 65.0 in | Wt 155.9 lb

## 2012-12-02 DIAGNOSIS — Z85850 Personal history of malignant neoplasm of thyroid: Secondary | ICD-10-CM

## 2012-12-02 DIAGNOSIS — E039 Hypothyroidism, unspecified: Secondary | ICD-10-CM

## 2012-12-02 LAB — T4, FREE: Free T4: 1.15 ng/dL (ref 0.60–1.60)

## 2012-12-02 NOTE — Patient Instructions (Addendum)
Please stop at the lab. We will try to get records from Dr Lady Gary. You will be called with appointment for thyroid ultrasound. Please return in 3 months for a visit, but will likely need you to come sooner for a lab appointment to recheck thyroid labs.

## 2012-12-02 NOTE — Progress Notes (Addendum)
Patient ID: Lauren Mcgee, female   DOB: Aug 24, 1940, 72 y.o.   MRN: [id number]   HPI  Lauren Mcgee is a 72 y.o.-year-old female, referred by her PCP, Dr.Plotnikov, for evaluation for Thyroid Cancer with  hypothyroidism.  Pt. has been dx with hypothyroidism in 2010, s/p thyroidectomy (Dr Duanne Guess) for a thyroid tumor >> turn out to be [facility] >> had RAI tx; she was followed by her surgeon q6h. Last U/S of thyroid 1 year ago.   Sheis on Levothyroxine 150 mcg (despite advised to take 137 mcg) - brand name, taken: - fasting - with water - separated by 1h from b'fast  - no calcium - no iron - takes PPIs (Prevacid) at night - no multivitamins   I reviewed pt's thyroid tests: Lab Results  Component Value Date   TSH 0.03* 10/10/2012   TSH 0.03* 01/31/2012   TSH 0.02* 07/05/2010   TSH 0.02* 05/09/2010   TSH 1.66 11/24/2008   TSH 1.94 05/30/2007   FREET4 1.60 10/10/2012    Her Levothyroxine has been gradually decreased from 175, to 150 (02/2012, then to 137 (10/08/2012).  Pt denies feeling nodules in neck, + hoarseness (especially when she sings) - worse now, no dysphagia/odynophagia, SOB with lying down; she c/o: - weight loss - lost 6 lbs 02-10/2012 - but mostly 2/2 bronchitis episode x 3 weeks - feeling hot  - no palpitations - no fatigue - no constipation - no dry skin/sweating/hair falling - no anxiety/depression  She is on Atenolol 25.   Na FH of thyroid disorders. No FH of thyroid cancer. No h/o radiation tx to head or neck other than the RAI tx.   I reviewed her chart and she also has a history of HTN,HL (Crestor >> fatigue), HGly (HbA1c 5.5% in 10/2012, PVD, GERD.   ROS: Constitutional: no weight gain/loss, no fatigue, occasional hot flashes Eyes: no blurry vision, no xerophthalmia ENT: no sore throat, no nodules palpated in throat, no dysphagia/odynophagia, + hoarseness, + decreased hearing Cardiovascular: no CP/SOB/palpitations/leg swelling Respiratory: + occasional  cough/no SOB Gastrointestinal: no N/V/D/C, + heartburn Musculoskeletal: no muscle/joint aches Skin: no rashes Neurological: no tremors/numbness/tingling/dizziness Psychiatric: no depression/anxiety  Past Medical History  Diagnosis Date  . Hyperlipidemia   . PVD (peripheral vascular disease)   . Hypertension   . GERD (gastroesophageal reflux disease)   . Hx of colonic polyp   . Hypothyroidism   . Thyroid cancer 2011   Past Surgical History  Procedure Laterality Date  . Thyroidectomy      and 131I treatment at [facility]- Dr. Lady Gary    History   Social History  . Marital Status: Married    Spouse Name: N/A    Number of Children: 2   Occupational History  . retired    Social History Main Topics  . Smoking status: Former Games developer  . Smokeless tobacco: Not on file  . Alcohol Use: No  . Drug Use: No  . Sexual Activity: Yes   Social History Narrative   Regular exercise: not outside the home    Caffeine use: coffee daily   Current Outpatient Prescriptions on File Prior to Visit  Medication Sig Dispense Refill  . aspirin 81 MG chewable tablet Chew 81 mg by mouth daily.        Marland Kitchen atenolol (TENORMIN) 25 MG tablet Take 1 tablet (25 mg total) by mouth daily.  90 tablet  3  . cholecalciferol (VITAMIN D) 1000 UNITS tablet Take 1,000 Units by mouth daily.        Marland Kitchen  Cod Liver Oil 1000 MG CAPS Take 2 capsules by mouth daily.      . cyanocobalamin 1000 MCG tablet Take 1,000 mcg by mouth.        . lansoprazole (PREVACID) 30 MG capsule Take 30 mg by mouth daily.        Marland Kitchen levothyroxine (SYNTHROID, LEVOTHROID) 137 MCG tablet Take 1 tablet (137 mcg total) by mouth daily before breakfast.  30 tablet  1  . omega-3 acid ethyl esters (LOVAZA) 1 G capsule Take 2 g by mouth daily.        . rosuvastatin (CRESTOR) 10 MG tablet Take 1 tablet (10 mg total) by mouth daily.  90 tablet  3   No current facility-administered medications on file prior to visit.   Allergies  Allergen Reactions  .  Influenza Vaccines     Local reaction   Family History  Problem Relation Age of Onset  . Hypertension Other   . Hyperlipidemia Mother    PE: BP 122/68  Pulse 64  Temp(Src) 97.7 F (36.5 C) (Oral)  Resp 10  Ht 5\' 5"  (1.651 m)  Wt 155 lb 14.4 oz (70.716 kg)  BMI 25.94 kg/m2  SpO2 97% Wt Readings from Last 3 Encounters:  12/02/12 155 lb 14.4 oz (70.716 kg)  10/08/12 157 lb (71.215 kg)  04/10/12 163 lb (73.936 kg)   Constitutional: overweight, in NAD Eyes: PERRLA, EOMI, no exophthalmos ENT: moist mucous membranes, no thyromegaly, no cervical lymphadenopathy Cardiovascular: RRR, No MRG Respiratory: CTA B Gastrointestinal: abdomen soft, NT, ND, BS+ Musculoskeletal: no deformities, strength intact in all 4 Skin: moist, warm, no rashes Neurological: no tremor with outstretched hands, DTR normal in all 4  ASSESSMENT: 1. Hypothyroidism  2. History of thyroid cancer - Records pending  PLAN:  1. Patient with long-standing hypothyroidism, on levothyroxine therapy. She appears euthyroid. - We discussed about correct intake of levothyroxine, fasting, with water, separated by at least 30 minutes from breakfast, and separated by more than 4 hours from calcium, iron, multivitamins, acid reflux medications (PPIs). - She does not appear to have a goiter, thyroid nodules, or neck compression symptoms - we'll check thyroid tests today: TSH, free T4, free T3 - If these are abnormal, she will need to return in 6-8 weeks for repeat labs - If these are normal, I will see her back in 4 months  Office Visit on 12/02/2012  Component Date Value Range Status  . TSH 12/02/2012 0.21* 0.35 - 5.50 uIU/mL Final  . Free T4 12/02/2012 1.15  0.60 - 1.60 ng/dL Final  . Thyroglobulin Ab 12/02/2012 <20.0  <40.0 U/mL Final  . Thyroglobulin 12/02/2012 <0.2  0.0 - 55.0 ng/mL Final   Comment:                            Thyroglobulin Autoantibodies may interfere with assays for                           Thyroglobulin and may cause underestimation of the Thyroglobulin                          level.  If Thyroglobulin is ordered alone, please interpret with                          caution.   Undetectable thyroglobulin and ATA. TSH is still  lower than normal, but higher than before. At this point, we can relax her target TSH to be in the low normal range >> will send Synthroid 125 mcg daily to her pharmacy. Return in 6 weeks for labs.  Neck U/S 12/10/2012: CLINICAL DATA: Thyroid cancer post thyroidectomy and radioactive  iodine ablation in 2010  EXAM:  THYROID ULTRASOUND  TECHNIQUE:  Ultrasound examination of the thyroid gland and adjacent soft  tissues was performed.  COMPARISON: None  FINDINGS:  Right thyroid lobe  Surgically absent. No residual nodule or tissue identified at the  right thyroid bed.  Left thyroid lobe  Surgically absent. No residual nodular tissue identified at the left  thyroid bed.  Isthmus  Surgically absent.  Lymphadenopathy  None visualized.  IMPRESSION:  Post thyroidectomy.  No evidence of residual thyroid tissue or mass/nodule at the thyroid  bed.  Electronically Signed  By: Ulyses Southward M.D.  On: 12/09/2012 14:09 Excellent news! Pt called.

## 2012-12-03 LAB — THYROGLOBULIN LEVEL: Thyroglobulin: <0.2 ng/mL (ref 0.0–55.0)

## 2012-12-03 LAB — THYROGLOBULIN ANTIBODY: Thyroglobulin Ab: <20 U/mL (ref <40.0)

## 2012-12-03 MED ORDER — SYNTHROID 125 MCG PO TABS: 125.0000 ug | ORAL_TABLET | Freq: Every day | ORAL | Status: DC

## 2012-12-04 ENCOUNTER — Telehealth: Payer: Self-pay | Admitting: *Deleted

## 2012-12-04 NOTE — Telephone Encounter (Signed)
Called pt and gave lab results.

## 2012-12-09 ENCOUNTER — Ambulatory Visit
Admission: RE | Admit: 2012-12-09 | Discharge: 2012-12-09 | Disposition: A | Discharge status: Home / Self Care | Payer: Medicare Other | Source: Ambulatory Visit | Attending: Internal Medicine | Admitting: Internal Medicine

## 2012-12-10 ENCOUNTER — Telehealth: Payer: Self-pay | Admitting: *Deleted

## 2012-12-10 NOTE — Telephone Encounter (Signed)
Called pt and advised her that her thyroid ultrasound does not show any signs of recurrence of her thyroid cancer. Pt really pleased.

## 2013-03-03 ENCOUNTER — Ambulatory Visit: Payer: Medicare Other | Admitting: Internal Medicine

## 2013-03-03 DIAGNOSIS — Z0289 Encounter for other administrative examinations: Secondary | ICD-10-CM

## 2013-03-14 ENCOUNTER — Ambulatory Visit (INDEPENDENT_AMBULATORY_CARE_PROVIDER_SITE_OTHER): Payer: Medicare Other | Admitting: Internal Medicine

## 2013-03-14 ENCOUNTER — Encounter: Payer: Self-pay | Admitting: Internal Medicine

## 2013-03-14 VITALS — BP 114/68 | HR 72 | Temp 97.5°F | Resp 12 | Wt 162.1 lb

## 2013-03-14 DIAGNOSIS — E039 Hypothyroidism, unspecified: Secondary | ICD-10-CM

## 2013-03-14 DIAGNOSIS — Z85850 Personal history of malignant neoplasm of thyroid: Secondary | ICD-10-CM

## 2013-03-14 LAB — TSH: TSH: 0.03 u[IU]/mL — ABNORMAL LOW (ref 0.35–5.50)

## 2013-03-14 LAB — T4, FREE: FREE T4: 1.4 ng/dL (ref 0.60–1.60)

## 2013-03-14 NOTE — Progress Notes (Signed)
Patient ID: Lauren Mcgee, female   DOB: 11-29-1940, 73 y.o.   MRN: [id number]   HPI  Lauren Mcgee is a 73 y.o.-year-old female, returning for f/u for Thyroid Cancer in remission, with  Postsurgical hypothyroidism. Last visit 3 mo ago.  Pt. has been dx with hypothyroidism in 2010, s/p thyroidectomy (Dr Fredirick Maudlin) for a thyroid tumor >> turn out to be [facility] >> had RAI tx; she was followed by her surgeon q6h. Last U/S of thyroid 1 year ago. Records still pending.   She was on Levothyroxine 150 mcg - brand name, changed to 125 mcg at last visit, 3 mo ago, when a TSH returned 0.21. - fasting - with water - separated by 1h from b'fast  - takes calcium later in the day - takes PPIs (Prevacid) at night  I reviewed pt's thyroid tests: Lab Results  Component Value Date   TSH 0.21* 12/02/2012   TSH 0.03* 10/10/2012   TSH 0.03* 01/31/2012   TSH 0.02* 07/05/2010   TSH 0.02* 05/09/2010   TSH 1.66 11/24/2008   TSH 1.94 05/30/2007   FREET4 1.15 12/02/2012   FREET4 1.60 10/10/2012   Pt denies feeling nodules in neck, + hoarseness (especially when she sings) - better, no dysphagia/odynophagia, SOB with lying down;   She c/o: - no more weight loss - gained 7 lbs - she was feeling hot - not anymore - no palpitations - no fatigue - no constipation - no dry skin/sweating/hair falling - no anxiety/depression  She is on Atenolol 25.   She also has a history of HTN,HL (Crestor >> fatigue), HGly (HbA1c 5.5% in 10/2012, PVD, GERD.   ROS: Constitutional: no weight gain/loss, no fatigue, occasional hot flashes Eyes: no blurry vision, no xerophthalmia ENT: no sore throat, no nodules palpated in throat, no dysphagia/odynophagia, + hoarseness, + decreased hearing Cardiovascular: no CP/SOB/palpitations/leg swelling Respiratory: no cough/no SOB Gastrointestinal: no N/V/D/C, + heartburn Musculoskeletal: no muscle/joint aches Skin: no rashes Neurological: no tremors/numbness/tingling/dizziness  I reviewed  pt's medications, allergies, PMH, social hx, family hx and no changes required, except as mentioned above.  PE: BP 114/68  Pulse 72  Temp(Src) 97.5 F (36.4 C) (Oral)  Resp 12  Wt 162 lb 1.6 oz (73.528 kg)  SpO2 95% Wt Readings from Last 3 Encounters:  03/14/13 162 lb 1.6 oz (73.528 kg)  12/02/12 155 lb 14.4 oz (70.716 kg)  10/08/12 157 lb (71.215 kg)   Constitutional: overweight, in NAD Eyes: PERRLA, EOMI, no exophthalmos ENT: moist mucous membranes, no thyromegaly, no cervical lymphadenopathy Cardiovascular: RRR, No MRG Respiratory: CTA B Gastrointestinal: abdomen soft, NT, ND, BS+ Musculoskeletal: no deformities, strength intact in all 4 Skin: moist, warm, no rashes Neurological: no tremor with outstretched hands, DTR normal in all 4  ASSESSMENT: 1. Hypothyroidism  2. History of thyroid cancer - 12/02/2012: Tg <0.2, ATA<20, TSH 0.21, fT4 1.15 - 12/10/2012: Neck U/S: Post thyroidectomy. No evidence of residual thyroid tissue or mass/nodule at the thyroid bed.   PLAN:  1. Patient with long-standing hypothyroidism, on levothyroxine therapy. She appears euthyroid. She feels better on the lower Synthroid dose - no more hot flushes or weight loss. - We again discussed about correct intake of levothyroxine, fasting, with water, separated by at least 30 minutes from breakfast, and separated by more than 4 hours from calcium, iron, multivitamins, acid reflux medications (PPIs). She is taking this correcly. - She does not appear to have thyroid masses or cervical LAD - we'll check thyroid tests today: TSH,  free T4 - Needs refill Synthroid.  - If these are abnormal, she will need to return in 6-8 weeks for repeat labs - If these are normal, I will see her back in 6 months  Office Visit on 03/14/2013  Component Date Value Range Status  . TSH 03/14/2013 0.03* 0.35 - 5.50 uIU/mL Final  . Free T4 03/14/2013 1.40  0.60 - 1.60 ng/dL Final   I am perplexed by this result... Will  decrease the Synthroid further to 112 mcg. Will send to her pharmacy and needs to RTC in 5 weeks for labs.

## 2013-03-14 NOTE — Patient Instructions (Signed)
Please stop at the lab. We will call you with results. Please come back for a follow-up appointment in 6 months.  Have a great time on the cruise!

## 2013-03-17 MED ORDER — SYNTHROID 112 MCG PO TABS: 112.0000 ug | ORAL_TABLET | Freq: Every day | ORAL | Status: DC

## 2013-03-17 MED ORDER — LEVOTHYROXINE SODIUM 112 MCG PO TABS: 112.0000 ug | ORAL_TABLET | Freq: Every day | ORAL | Status: DC

## 2013-04-16 ENCOUNTER — Ambulatory Visit: Payer: Medicare Other | Admitting: Internal Medicine

## 2013-04-22 ENCOUNTER — Ambulatory Visit: Payer: Medicare Other | Admitting: Internal Medicine

## 2013-04-23 ENCOUNTER — Telehealth: Payer: Self-pay | Admitting: *Deleted

## 2013-04-23 NOTE — Telephone Encounter (Signed)
ToShelba Flake Fax: [phone] From: [facility] Date/ Time: 04/21/2013 4:57 PM Taken By: Leane Call, CSR Caller: [place]: home Patient: Lauren Mcgee, Lauren Mcgee DOB: 1940-10-03 Phone: [phone] Reason for Call: Pt has an appointment scheduled fro 3pm tomorrow that she needs to reschedule Regarding Appointment: Appt Date: Appt Time: Unknown Provider: Reason: Details:

## 2013-05-28 ENCOUNTER — Ambulatory Visit: Payer: Medicare Other | Admitting: Internal Medicine

## 2013-06-04 ENCOUNTER — Encounter: Payer: Self-pay | Admitting: Internal Medicine

## 2013-06-04 ENCOUNTER — Telehealth: Payer: Self-pay | Admitting: Internal Medicine

## 2013-06-04 ENCOUNTER — Ambulatory Visit (INDEPENDENT_AMBULATORY_CARE_PROVIDER_SITE_OTHER): Payer: Medicare Other | Admitting: Internal Medicine

## 2013-06-04 VITALS — BP 140/78 | HR 76 | Temp 97.6°F | Resp 16 | Wt 158.0 lb

## 2013-06-04 DIAGNOSIS — F411 Generalized anxiety disorder: Secondary | ICD-10-CM | POA: Insufficient documentation

## 2013-06-04 DIAGNOSIS — I10 Essential (primary) hypertension: Secondary | ICD-10-CM

## 2013-06-04 DIAGNOSIS — E785 Hyperlipidemia, unspecified: Secondary | ICD-10-CM

## 2013-06-04 DIAGNOSIS — I739 Peripheral vascular disease, unspecified: Secondary | ICD-10-CM

## 2013-06-04 MED ORDER — ESCITALOPRAM OXALATE 10 MG PO TABS: 10.0000 mg | ORAL_TABLET | Freq: Every day | ORAL | Status: DC

## 2013-06-04 NOTE — Assessment & Plan Note (Signed)
Continue with current prescription therapy as reflected on the Med list.  

## 2013-06-04 NOTE — Progress Notes (Signed)
   Subjective:     HPI   F/u fatigue from Crestor -  Not taking - read negative stuff about statins  The patient presents for a follow-up of  chronic hypertension, chronic dyslipidemia, PVD, hypothyroidism controlled with medicines  Wt Readings from Last 3 Encounters:  06/04/13 158 lb (71.668 kg)  03/14/13 162 lb 1.6 oz (73.528 kg)  12/02/12 155 lb 14.4 oz (70.716 kg)   BP Readings from Last 3 Encounters:  06/04/13 140/78  03/14/13 114/68  12/02/12 122/68        Review of Systems  Constitutional: Negative for chills, activity change, appetite change, fatigue and unexpected weight change.  HENT: Negative for congestion, mouth sores and sinus pressure.   Eyes: Negative for visual disturbance.  Respiratory: Negative for cough and chest tightness.   Gastrointestinal: Negative for nausea and abdominal pain.  Genitourinary: Negative for frequency, difficulty urinating and vaginal pain.  Musculoskeletal: Negative for back pain and gait problem.  Skin: Negative for pallor and rash.  Neurological: Negative for dizziness, tremors, weakness, numbness and headaches.  Psychiatric/Behavioral: Negative for suicidal ideas, confusion and sleep disturbance.       Objective:   Physical Exam  Constitutional: She appears well-developed. No distress.  HENT:  Head: Normocephalic.  Right Ear: External ear normal.  Left Ear: External ear normal.  Nose: Nose normal.  Mouth/Throat: Oropharynx is clear and moist.  Eyes: Conjunctivae are normal. Pupils are equal, round, and reactive to light. Right eye exhibits no discharge. Left eye exhibits no discharge.  Neck: Normal range of motion. Neck supple. No JVD present. No tracheal deviation present. No thyromegaly present.  Cardiovascular: Normal rate, regular rhythm and normal heart sounds.   Pulmonary/Chest: No stridor. No respiratory distress. She has no wheezes.  Abdominal: Soft. Bowel sounds are normal. She exhibits no distension and no mass.  There is no tenderness. There is no rebound and no guarding.  Musculoskeletal: She exhibits no edema and no tenderness.  Lymphadenopathy:    She has no cervical adenopathy.  Neurological: She displays normal reflexes. No cranial nerve deficit. She exhibits normal muscle tone. Coordination normal.  Skin: No rash noted. No erythema.  Psychiatric: She has a normal mood and affect. Her behavior is normal. Judgment and thought content normal.     Lab Results  Component Value Date   WBC 5.4 01/31/2012   HGB 13.9 01/31/2012   HCT 41.7 01/31/2012   PLT 138.0* 01/31/2012   GLUCOSE 110* 01/31/2012   CHOL 181 10/10/2012   TRIG 128.0 10/10/2012   HDL 38.70* 10/10/2012   LDLDIRECT 158.1 11/24/2008   LDLCALC 117* 10/10/2012   ALT 33 10/10/2012   AST 34 10/10/2012   NA 140 01/31/2012   K 4.0 01/31/2012   CL 106 01/31/2012   CREATININE 0.7 01/31/2012   BUN 10 01/31/2012   CO2 28 01/31/2012   TSH 0.03* 03/14/2013   HGBA1C 5.5 10/10/2012        Assessment & Plan:

## 2013-06-04 NOTE — Assessment & Plan Note (Signed)
Re-start Crestor 3/wk if tolerated for Carotid disease 

## 2013-06-04 NOTE — Assessment & Plan Note (Signed)
Re-start Crestor 3/wk if tolerated for Carotid disease

## 2013-06-04 NOTE — Telephone Encounter (Signed)
Relevant patient education assigned to patient using Emmi. ° °

## 2013-06-04 NOTE — Assessment & Plan Note (Signed)
Cont Lexapro 

## 2013-06-04 NOTE — Progress Notes (Signed)
Pre visit review using our clinic review tool, if applicable. No additional management support is needed unless otherwise documented below in the visit note. 

## 2013-06-08 ENCOUNTER — Encounter: Payer: Self-pay | Admitting: Internal Medicine

## 2013-06-24 ENCOUNTER — Encounter: Payer: Self-pay | Admitting: Internal Medicine

## 2013-06-24 ENCOUNTER — Ambulatory Visit (INDEPENDENT_AMBULATORY_CARE_PROVIDER_SITE_OTHER): Payer: Medicare Other | Admitting: Internal Medicine

## 2013-06-24 VITALS — BP 118/64 | HR 63 | Temp 98.0°F | Resp 12 | Wt 157.0 lb

## 2013-06-24 DIAGNOSIS — E039 Hypothyroidism, unspecified: Secondary | ICD-10-CM

## 2013-06-24 DIAGNOSIS — Z85850 Personal history of malignant neoplasm of thyroid: Secondary | ICD-10-CM

## 2013-06-24 LAB — TSH: TSH: 0.02 u[IU]/mL — ABNORMAL LOW (ref 0.35–5.50)

## 2013-06-24 NOTE — Patient Instructions (Signed)
Please come back for a follow-up appointment in 6 months.  Please stop at the lab.    

## 2013-06-24 NOTE — Progress Notes (Signed)
Patient ID: Lauren Mcgee, female   DOB: 1940/05/25, 73 y.o.   MRN: [id number]   HPI  Lauren Mcgee is a 73 y.o.-year-old female, returning for f/u for Thyroid Cancer in remission, with  Postsurgical hypothyroidism. Last visit 3 mo ago.  Reviewed hx: Pt. has been dx with hypothyroidism in 2010, s/p thyroidectomy (Dr Fredirick Maudlin) for a thyroid tumor >> turn out to be [facility] >> had RAI tx; she was followed by her surgeon q6h. Last U/S of thyroid 1 year ago. Records still pending.   She is on Synthroid 112 mcg - brand name, after TSH returned at 0.03. She takes this: - fasting - with water - separated by 1h from b'fast  - takes calcium later in the day - takes PPIs (Prevacid) at night  I reviewed pt's thyroid tests: Lab Results  Component Value Date   TSH 0.03* 03/14/2013   TSH 0.21* 12/02/2012   TSH 0.03* 10/10/2012   TSH 0.03* 01/31/2012   TSH 0.02* 07/05/2010   TSH 0.02* 05/09/2010   TSH 1.66 11/24/2008   TSH 1.94 05/30/2007   FREET4 1.40 03/14/2013   FREET4 1.15 12/02/2012   FREET4 1.60 10/10/2012   Pt denies feeling nodules in neck, + hoarseness (especially when she sings), no dysphagia/odynophagia, SOB with lying down;   She c/o: - + weight loss - lost 5 lbs - she was feeling hot - no palpitations - no fatigue - no constipation - no dry skin/sweating/hair falling - no anxiety/depression  She is on Atenolol 25 (per cardiology)  She also has a history of HTN,HL (Crestor >> fatigue), HGly (HbA1c 5.5% in 10/2012, PVD, GERD.   ROS: Constitutional: no weight gain/loss, no fatigue, occasional hot flashes Eyes: no blurry vision, no xerophthalmia ENT: no sore throat, no nodules palpated in throat, no dysphagia/odynophagia, + hoarseness Cardiovascular: no CP/SOB/palpitations/leg swelling Respiratory: no cough/no SOB Gastrointestinal: no N/V/D/C/ heartburn Musculoskeletal: no muscle/joint aches Skin: no rashes Neurological: no tremors/numbness/tingling/dizziness  I reviewed pt's  medications, allergies, PMH, social hx, family hx and no changes required, except as mentioned above.  PE: BP 118/64  Pulse 63  Temp(Src) 98 F (36.7 C) (Oral)  Resp 12  Wt 157 lb (71.215 kg)  SpO2 96% Wt Readings from Last 3 Encounters:  06/24/13 157 lb (71.215 kg)  06/04/13 158 lb (71.668 kg)  03/14/13 162 lb 1.6 oz (73.528 kg)   Constitutional: overweight, in NAD Eyes: PERRLA, EOMI, no exophthalmos ENT: moist mucous membranes, no thyromegaly, no cervical lymphadenopathy Cardiovascular: RRR, No MRG Respiratory: CTA B Gastrointestinal: abdomen soft, NT, ND, BS+ Musculoskeletal: no deformities, strength intact in all 4 Skin: moist, warm, no rashes Neurological: no tremor with outstretched hands, DTR normal in all 4  ASSESSMENT: 1. Hypothyroidism  2. History of thyroid cancer - 12/02/2012: Tg <0.2, ATA<20, TSH 0.21, fT4 1.15 - 12/10/2012: Neck U/S: Post thyroidectomy. No evidence of residual thyroid tissue or mass/nodule at the thyroid bed.   PLAN:  1. Patient with long-standing hypothyroidism, on levothyroxine therapy. She appears euthyroid. She feels better on the lower Synthroid dose - but still has hot flushes - We again discussed about correct intake of levothyroxine, fasting, with water, separated by at least 30 minutes from breakfast, and separated by more than 4 hours from calcium, iron, multivitamins, acid reflux medications (PPIs). She is taking this correcly. - She does not appear to have thyroid masses or cervical LAD - we'll check thyroid tests today: TSH, free T4 and will add Tg + ATA - fr  her hoarseness, I suggested the [facility]  - she needs to check if insurance covers it - If these are abnormal, she will need to return in 6-8 weeks for repeat labs - If these are normal, I will see her back in 6 months  Office Visit on 06/24/2013  Component Date Value Ref Range Status  . TSH 06/24/2013 0.02* 0.35 - 5.50 uIU/mL Final  . Free T4 06/24/2013 1.22   0.60 - 1.60 ng/dL Final  . Thyroglobulin 06/24/2013 <0.2  0.0 - 55.0 ng/mL Final   Comment:                            Thyroglobulin Autoantibodies may interfere with assays for                          Thyroglobulin and may cause underestimation of the Thyroglobulin                          level.  If Thyroglobulin is ordered alone, please interpret with                          caution.  . Thyroglobulin Ab 06/24/2013 <20.0  <40.0 U/mL Final   Very low TSH. Decrease the LT4 dose further, to 100 >> return for labs in 6 weeks. Tg + ATA undetectable.

## 2013-06-25 LAB — T4, FREE: Free T4: 1.22 ng/dL (ref 0.60–1.60)

## 2013-06-25 LAB — THYROGLOBULIN ANTIBODY: Thyroglobulin Ab: <20 U/mL (ref <40.0)

## 2013-06-25 LAB — THYROGLOBULIN LEVEL: Thyroglobulin: <0.2 ng/mL (ref 0.0–55.0)

## 2013-06-26 MED ORDER — SYNTHROID 100 MCG PO TABS: 100.0000 ug | ORAL_TABLET | Freq: Every day | ORAL | Status: DC

## 2013-07-29 ENCOUNTER — Other Ambulatory Visit: Payer: Self-pay | Admitting: *Deleted

## 2013-07-29 MED ORDER — SYNTHROID 100 MCG PO TABS
100.0000 ug | ORAL_TABLET | Freq: Every day | ORAL | Status: DC
Start: 2013-07-29 — End: 2013-07-31

## 2013-07-29 MED ORDER — ESCITALOPRAM OXALATE 10 MG PO TABS: 10.0000 mg | ORAL_TABLET | Freq: Every day | ORAL | Status: DC

## 2013-07-31 ENCOUNTER — Other Ambulatory Visit: Payer: Self-pay | Admitting: *Deleted

## 2013-07-31 MED ORDER — SYNTHROID 100 MCG PO TABS
100.0000 ug | ORAL_TABLET | Freq: Every day | ORAL | Status: DC
Start: 2013-07-31 — End: 2014-07-30

## 2013-07-31 NOTE — Telephone Encounter (Signed)
Pt requested 90 day supply.

## 2013-10-07 ENCOUNTER — Other Ambulatory Visit (INDEPENDENT_AMBULATORY_CARE_PROVIDER_SITE_OTHER): Payer: Medicare Other

## 2013-10-07 DIAGNOSIS — E785 Hyperlipidemia, unspecified: Secondary | ICD-10-CM

## 2013-10-07 DIAGNOSIS — I10 Essential (primary) hypertension: Secondary | ICD-10-CM

## 2013-10-07 DIAGNOSIS — F411 Generalized anxiety disorder: Secondary | ICD-10-CM

## 2013-10-07 DIAGNOSIS — I739 Peripheral vascular disease, unspecified: Secondary | ICD-10-CM

## 2013-10-07 LAB — URINALYSIS, ROUTINE W REFLEX MICROSCOPIC
Bilirubin Urine: NEGATIVE
Ketones, ur: NEGATIVE
Nitrite: NEGATIVE
SPECIFIC GRAVITY, URINE: 1.01 (ref 1.000–1.030)
TOTAL PROTEIN, URINE-UPE24: NEGATIVE
URINE GLUCOSE: NEGATIVE
Urobilinogen, UA: 0.2 (ref 0.0–1.0)
pH: 6 (ref 5.0–8.0)

## 2013-10-07 LAB — BASIC METABOLIC PANEL
BUN: 8 mg/dL (ref 6–23)
CO2: 27 meq/L (ref 19–32)
CREATININE: 0.9 mg/dL (ref 0.4–1.2)
Calcium: 9.3 mg/dL (ref 8.4–10.5)
Chloride: 105 mEq/L (ref 96–112)
GFR: 69.7 mL/min (ref >60.00)
GLUCOSE: 97 mg/dL (ref 70–99)
Potassium: 4.4 mEq/L (ref 3.5–5.1)
Sodium: 140 mEq/L (ref 135–145)

## 2013-10-07 LAB — CBC WITH DIFFERENTIAL/PLATELET
Basophils Absolute: 0 10*3/uL (ref 0.0–0.1)
Basophils Relative: 0.4 % (ref 0.0–3.0)
Eosinophils Absolute: 0.2 10*3/uL (ref 0.0–0.7)
Eosinophils Relative: 2.5 % (ref 0.0–5.0)
HEMATOCRIT: 43.7 % (ref 36.0–46.0)
HEMOGLOBIN: 14.9 g/dL (ref 12.0–15.0)
Lymphocytes Relative: 24.2 % (ref 12.0–46.0)
Lymphs Abs: 1.6 10*3/uL (ref 0.7–4.0)
MCHC: 34.1 g/dL (ref 30.0–36.0)
MCV: 85.3 fl (ref 78.0–100.0)
MONO ABS: 0.5 10*3/uL (ref 0.1–1.0)
MONOS PCT: 7.6 % (ref 3.0–12.0)
NEUTROS ABS: 4.3 10*3/uL (ref 1.4–7.7)
Neutrophils Relative %: 65.3 % (ref 43.0–77.0)
Platelets: 184 10*3/uL (ref 150.0–400.0)
RBC: 5.12 Mil/uL — ABNORMAL HIGH (ref 3.87–5.11)
RDW: 13.6 % (ref 11.5–15.5)
WBC: 6.6 10*3/uL (ref 4.0–10.5)

## 2013-10-07 LAB — LIPID PANEL
Cholesterol: 207 mg/dL — ABNORMAL HIGH (ref 0–200)
HDL: 42 mg/dL (ref >39.00)
LDL Cholesterol: 131 mg/dL — ABNORMAL HIGH (ref 0–99)
NonHDL: 165
Total CHOL/HDL Ratio: 5
Triglycerides: 168 mg/dL — ABNORMAL HIGH (ref 0.0–149.0)
VLDL: 33.6 mg/dL (ref 0.0–40.0)

## 2013-10-07 LAB — TSH: TSH: 0.05 u[IU]/mL — ABNORMAL LOW (ref 0.35–4.50)

## 2013-10-07 LAB — HEPATIC FUNCTION PANEL
ALBUMIN: 4.4 g/dL (ref 3.5–5.2)
ALT: 24 U/L (ref 0–35)
AST: 28 U/L (ref 0–37)
Alkaline Phosphatase: 49 U/L (ref 39–117)
Bilirubin, Direct: 0.1 mg/dL (ref 0.0–0.3)
TOTAL PROTEIN: 7.2 g/dL (ref 6.0–8.3)
Total Bilirubin: 0.8 mg/dL (ref 0.2–1.2)

## 2013-10-08 ENCOUNTER — Ambulatory Visit (INDEPENDENT_AMBULATORY_CARE_PROVIDER_SITE_OTHER): Payer: Medicare Other | Admitting: Internal Medicine

## 2013-10-08 ENCOUNTER — Encounter: Payer: Self-pay | Admitting: Internal Medicine

## 2013-10-08 VITALS — BP 133/78 | HR 58 | Temp 97.4°F | Ht 66.0 in | Wt 156.0 lb

## 2013-10-08 DIAGNOSIS — I10 Essential (primary) hypertension: Secondary | ICD-10-CM

## 2013-10-08 DIAGNOSIS — I739 Peripheral vascular disease, unspecified: Secondary | ICD-10-CM

## 2013-10-08 DIAGNOSIS — R7309 Other abnormal glucose: Secondary | ICD-10-CM

## 2013-10-08 DIAGNOSIS — E785 Hyperlipidemia, unspecified: Secondary | ICD-10-CM

## 2013-10-08 DIAGNOSIS — Z23 Encounter for immunization: Secondary | ICD-10-CM

## 2013-10-08 DIAGNOSIS — E039 Hypothyroidism, unspecified: Secondary | ICD-10-CM

## 2013-10-08 DIAGNOSIS — Z0000 Encounter for general adult medical examination without abnormal findings: Secondary | ICD-10-CM

## 2013-10-08 DIAGNOSIS — R739 Hyperglycemia, unspecified: Secondary | ICD-10-CM

## 2013-10-08 MED ORDER — ROSUVASTATIN CALCIUM 10 MG PO TABS: 10.0000 mg | ORAL_TABLET | Freq: Every day | ORAL | Status: DC

## 2013-10-08 NOTE — Assessment & Plan Note (Signed)
Continue with current prescription therapy as reflected on the Med list.  

## 2013-10-08 NOTE — Addendum Note (Signed)
Addended by: Cresenciano Lick on: 10/08/2013 05:03 PM   Modules accepted: Orders

## 2013-10-08 NOTE — Assessment & Plan Note (Signed)
Labs better

## 2013-10-08 NOTE — Assessment & Plan Note (Signed)
Here for medicare wellness/physical  Diet: heart healthy  Physical activity: good Depression/mood screen: negative  Hearing: intact to whispered voice  Visual acuity: grossly normal, performs annual eye exam  ADLs: capable  Fall risk: none  Home safety: good  Cognitive evaluation: intact to orientation, naming, recall and repetition  EOL planning: adv directives, full code/ I agree  I have personally reviewed and have noted  1. The patient's medical and social history  2. Their use of alcohol, tobacco or illicit drugs  3. Their current medications and supplements  4. The patient's functional ability including ADL's, fall risks, home safety risks and hearing or visual impairment.  5. Diet and physical activities  6. Evidence for depression or mood disorders    Today patient counseled on age appropriate routine health concerns for screening and prevention, each reviewed and up to date or declined. Immunizations reviewed and up to date or declined. Labs ordered and reviewed. Risk factors for depression reviewed and negative. Hearing function and visual acuity are intact. ADLs screened and addressed as needed. Functional ability and level of safety reviewed and appropriate. Education, counseling and referrals performed based on assessed risks today. Patient provided with a copy of personalized plan for preventive services.

## 2013-10-08 NOTE — Assessment & Plan Note (Addendum)
Chronic post-surgical Managed by her Endo - Dr Cruzita Lederer Continue with current prescription therapy as reflected on the Med list.

## 2013-10-08 NOTE — Assessment & Plan Note (Addendum)
Continue with current prescription therapy as reflected on the Med list - not able to tolerate a higher dose

## 2013-10-08 NOTE — Progress Notes (Deleted)
Pre visit review using our clinic review tool, if applicable. No additional management support is needed unless otherwise documented below in the visit note. 

## 2013-10-08 NOTE — Patient Instructions (Signed)
Preventive Care for Adults A healthy lifestyle and preventive care can promote health and wellness. Preventive health guidelines for women include the following key practices.  A routine yearly physical is a good way to check with your health care provider about your health and preventive screening. It is a chance to share any concerns and updates on your health and to receive a thorough exam.  Visit your dentist for a routine exam and preventive care every 6 months. Brush your teeth twice a day and floss once a day. Good oral hygiene prevents tooth decay and gum disease.  The frequency of eye exams is based on your age, health, family medical history, use of contact lenses, and other factors. Follow your health care provider's recommendations for frequency of eye exams.  Eat a healthy diet. Foods like vegetables, fruits, whole grains, low-fat dairy products, and lean protein foods contain the nutrients you need without too many calories. Decrease your intake of foods high in solid fats, added sugars, and salt. Eat the right amount of calories for you.Get information about a proper diet from your health care provider, if necessary.  Regular physical exercise is one of the most important things you can do for your health. Most adults should get at least 150 minutes of moderate-intensity exercise (any activity that increases your heart rate and causes you to sweat) each week. In addition, most adults need muscle-strengthening exercises on 2 or more days a week.  Maintain a healthy weight. The body mass index (BMI) is a screening tool to identify possible weight problems. It provides an estimate of body fat based on height and weight. Your health care provider can find your BMI and can help you achieve or maintain a healthy weight.For adults 20 years and older:  A BMI below 18.5 is considered underweight.  A BMI of 18.5 to 24.9 is normal.  A BMI of 25 to 29.9 is considered overweight.  A BMI of  30 and above is considered obese.  Maintain normal blood lipids and cholesterol levels by exercising and minimizing your intake of saturated fat. Eat a balanced diet with plenty of fruit and vegetables. Blood tests for lipids and cholesterol should begin at age 76 and be repeated every 5 years. If your lipid or cholesterol levels are high, you are over 50, or you are at high risk for heart disease, you may need your cholesterol levels checked more frequently.Ongoing high lipid and cholesterol levels should be treated with medicines if diet and exercise are not working.  If you smoke, find out from your health care provider how to quit. If you do not use tobacco, do not start.  Lung cancer screening is recommended for adults aged 22-80 years who are at high risk for developing lung cancer because of a history of smoking. A yearly low-dose CT scan of the lungs is recommended for people who have at least a 30-pack-year history of smoking and are a current smoker or have quit within the past 15 years. A pack year of smoking is smoking an average of 1 pack of cigarettes a day for 1 year (for example: 1 pack a day for 30 years or 2 packs a day for 15 years). Yearly screening should continue until the smoker has stopped smoking for at least 15 years. Yearly screening should be stopped for people who develop a health problem that would prevent them from having lung cancer treatment.  If you are pregnant, do not drink alcohol. If you are breastfeeding,  be very cautious about drinking alcohol. If you are not pregnant and choose to drink alcohol, do not have more than 1 drink per day. One drink is considered to be 12 ounces (355 mL) of beer, 5 ounces (148 mL) of wine, or 1.5 ounces (44 mL) of liquor.  Avoid use of street drugs. Do not share needles with anyone. Ask for help if you need support or instructions about stopping the use of drugs.  High blood pressure causes heart disease and increases the risk of  stroke. Your blood pressure should be checked at least every 1 to 2 years. Ongoing high blood pressure should be treated with medicines if weight loss and exercise do not work.  If you are 3-86 years old, ask your health care provider if you should take aspirin to prevent strokes.  Diabetes screening involves taking a blood sample to check your fasting blood sugar level. This should be done once every 3 years, after age 67, if you are within normal weight and without risk factors for diabetes. Testing should be considered at a younger age or be carried out more frequently if you are overweight and have at least 1 risk factor for diabetes.  Breast cancer screening is essential preventive care for women. You should practice "breast self-awareness." This means understanding the normal appearance and feel of your breasts and may include breast self-examination. Any changes detected, no matter how small, should be reported to a health care provider. Women in their 8s and 30s should have a clinical breast exam (CBE) by a health care provider as part of a regular health exam every 1 to 3 years. After age 70, women should have a CBE every year. Starting at age 25, women should consider having a mammogram (breast X-ray test) every year. Women who have a family history of breast cancer should talk to their health care provider about genetic screening. Women at a high risk of breast cancer should talk to their health care providers about having an MRI and a mammogram every year.  Breast cancer gene (BRCA)-related cancer risk assessment is recommended for women who have family members with BRCA-related cancers. BRCA-related cancers include breast, ovarian, tubal, and peritoneal cancers. Having family members with these cancers may be associated with an increased risk for harmful changes (mutations) in the breast cancer genes BRCA1 and BRCA2. Results of the assessment will determine the need for genetic counseling and  BRCA1 and BRCA2 testing.  Routine pelvic exams to screen for cancer are no longer recommended for nonpregnant women who are considered low risk for cancer of the pelvic organs (ovaries, uterus, and vagina) and who do not have symptoms. Ask your health care provider if a screening pelvic exam is right for you.  If you have had past treatment for cervical cancer or a condition that could lead to cancer, you need Pap tests and screening for cancer for at least 20 years after your treatment. If Pap tests have been discontinued, your risk factors (such as having a new sexual partner) need to be reassessed to determine if screening should be resumed. Some women have medical problems that increase the chance of getting cervical cancer. In these cases, your health care provider may recommend more frequent screening and Pap tests.  The HPV test is an additional test that may be used for cervical cancer screening. The HPV test looks for the virus that can cause the cell changes on the cervix. The cells collected during the Pap test can be  tested for HPV. The HPV test could be used to screen women aged 30 years and older, and should be used in women of any age who have unclear Pap test results. After the age of 30, women should have HPV testing at the same frequency as a Pap test.  Colorectal cancer can be detected and often prevented. Most routine colorectal cancer screening begins at the age of 50 years and continues through age 75 years. However, your health care provider may recommend screening at an earlier age if you have risk factors for colon cancer. On a yearly basis, your health care provider may provide home test kits to check for hidden blood in the stool. Use of a small camera at the end of a tube, to directly examine the colon (sigmoidoscopy or colonoscopy), can detect the earliest forms of colorectal cancer. Talk to your health care provider about this at age 50, when routine screening begins. Direct  exam of the colon should be repeated every 5-10 years through age 75 years, unless early forms of pre-cancerous polyps or small growths are found.  People who are at an increased risk for hepatitis B should be screened for this virus. You are considered at high risk for hepatitis B if:  You were born in a country where hepatitis B occurs often. Talk with your health care provider about which countries are considered high risk.  Your parents were born in a high-risk country and you have not received a shot to protect against hepatitis B (hepatitis B vaccine).  You have HIV or AIDS.  You use needles to inject street drugs.  You live with, or have sex with, someone who has hepatitis B.  You get hemodialysis treatment.  You take certain medicines for conditions like cancer, organ transplantation, and autoimmune conditions.  Hepatitis C blood testing is recommended for all people born from 1945 through 1965 and any individual with known risks for hepatitis C.  Practice safe sex. Use condoms and avoid high-risk sexual practices to reduce the spread of sexually transmitted infections (STIs). STIs include gonorrhea, chlamydia, syphilis, trichomonas, herpes, HPV, and human immunodeficiency virus (HIV). Herpes, HIV, and HPV are viral illnesses that have no cure. They can result in disability, cancer, and death.  You should be screened for sexually transmitted illnesses (STIs) including gonorrhea and chlamydia if:  You are sexually active and are younger than 24 years.  You are older than 24 years and your health care provider tells you that you are at risk for this type of infection.  Your sexual activity has changed since you were last screened and you are at an increased risk for chlamydia or gonorrhea. Ask your health care provider if you are at risk.  If you are at risk of being infected with HIV, it is recommended that you take a prescription medicine daily to prevent HIV infection. This is  called preexposure prophylaxis (PrEP). You are considered at risk if:  You are a heterosexual woman, are sexually active, and are at increased risk for HIV infection.  You take drugs by injection.  You are sexually active with a partner who has HIV.  Talk with your health care provider about whether you are at high risk of being infected with HIV. If you choose to begin PrEP, you should first be tested for HIV. You should then be tested every 3 months for as long as you are taking PrEP.  Osteoporosis is a disease in which the bones lose minerals and strength   with aging. This can result in serious bone fractures or breaks. The risk of osteoporosis can be identified using a bone density scan. Women ages 65 years and over and women at risk for fractures or osteoporosis should discuss screening with their health care providers. Ask your health care provider whether you should take a calcium supplement or vitamin D to reduce the rate of osteoporosis.  Menopause can be associated with physical symptoms and risks. Hormone replacement therapy is available to decrease symptoms and risks. You should talk to your health care provider about whether hormone replacement therapy is right for you.  Use sunscreen. Apply sunscreen liberally and repeatedly throughout the day. You should seek shade when your shadow is shorter than you. Protect yourself by wearing long sleeves, pants, a wide-brimmed hat, and sunglasses year round, whenever you are outdoors.  Once a month, do a whole body skin exam, using a mirror to look at the skin on your back. Tell your health care provider of new moles, moles that have irregular borders, moles that are larger than a pencil eraser, or moles that have changed in shape or color.  Stay current with required vaccines (immunizations).  Influenza vaccine. All adults should be immunized every year.  Tetanus, diphtheria, and acellular pertussis (Td, Tdap) vaccine. Pregnant women should  receive 1 dose of Tdap vaccine during each pregnancy. The dose should be obtained regardless of the length of time since the last dose. Immunization is preferred during the 27th-36th week of gestation. An adult who has not previously received Tdap or who does not know her vaccine status should receive 1 dose of Tdap. This initial dose should be followed by tetanus and diphtheria toxoids (Td) booster doses every 10 years. Adults with an unknown or incomplete history of completing a 3-dose immunization series with Td-containing vaccines should begin or complete a primary immunization series including a Tdap dose. Adults should receive a Td booster every 10 years.  Varicella vaccine. An adult without evidence of immunity to varicella should receive 2 doses or a second dose if she has previously received 1 dose. Pregnant females who do not have evidence of immunity should receive the first dose after pregnancy. This first dose should be obtained before leaving the health care facility. The second dose should be obtained 4-8 weeks after the first dose.  Human papillomavirus (HPV) vaccine. Females aged 13-26 years who have not received the vaccine previously should obtain the 3-dose series. The vaccine is not recommended for use in pregnant females. However, pregnancy testing is not needed before receiving a dose. If a female is found to be pregnant after receiving a dose, no treatment is needed. In that case, the remaining doses should be delayed until after the pregnancy. Immunization is recommended for any person with an immunocompromised condition through the age of 26 years if she did not get any or all doses earlier. During the 3-dose series, the second dose should be obtained 4-8 weeks after the first dose. The third dose should be obtained 24 weeks after the first dose and 16 weeks after the second dose.  Zoster vaccine. One dose is recommended for adults aged 60 years or older unless certain conditions are  present.  Measles, mumps, and rubella (MMR) vaccine. Adults born before 1957 generally are considered immune to measles and mumps. Adults born in 1957 or later should have 1 or more doses of MMR vaccine unless there is a contraindication to the vaccine or there is laboratory evidence of immunity to   each of the three diseases. A routine second dose of MMR vaccine should be obtained at least 28 days after the first dose for students attending postsecondary schools, health care workers, or international travelers. People who received inactivated measles vaccine or an unknown type of measles vaccine during 1963-1967 should receive 2 doses of MMR vaccine. People who received inactivated mumps vaccine or an unknown type of mumps vaccine before 1979 and are at high risk for mumps infection should consider immunization with 2 doses of MMR vaccine. For females of childbearing age, rubella immunity should be determined. If there is no evidence of immunity, females who are not pregnant should be vaccinated. If there is no evidence of immunity, females who are pregnant should delay immunization until after pregnancy. Unvaccinated health care workers born before 1957 who lack laboratory evidence of measles, mumps, or rubella immunity or laboratory confirmation of disease should consider measles and mumps immunization with 2 doses of MMR vaccine or rubella immunization with 1 dose of MMR vaccine.  Pneumococcal 13-valent conjugate (PCV13) vaccine. When indicated, a person who is uncertain of her immunization history and has no record of immunization should receive the PCV13 vaccine. An adult aged 19 years or older who has certain medical conditions and has not been previously immunized should receive 1 dose of PCV13 vaccine. This PCV13 should be followed with a dose of pneumococcal polysaccharide (PPSV23) vaccine. The PPSV23 vaccine dose should be obtained at least 8 weeks after the dose of PCV13 vaccine. An adult aged 19  years or older who has certain medical conditions and previously received 1 or more doses of PPSV23 vaccine should receive 1 dose of PCV13. The PCV13 vaccine dose should be obtained 1 or more years after the last PPSV23 vaccine dose.  Pneumococcal polysaccharide (PPSV23) vaccine. When PCV13 is also indicated, PCV13 should be obtained first. All adults aged 65 years and older should be immunized. An adult younger than age 65 years who has certain medical conditions should be immunized. Any person who resides in a nursing home or long-term care facility should be immunized. An adult smoker should be immunized. People with an immunocompromised condition and certain other conditions should receive both PCV13 and PPSV23 vaccines. People with human immunodeficiency virus (HIV) infection should be immunized as soon as possible after diagnosis. Immunization during chemotherapy or radiation therapy should be avoided. Routine use of PPSV23 vaccine is not recommended for American Indians, [place] Natives, or people younger than 65 years unless there are medical conditions that require PPSV23 vaccine. When indicated, people who have unknown immunization and have no record of immunization should receive PPSV23 vaccine. One-time revaccination 5 years after the first dose of PPSV23 is recommended for people aged 19-64 years who have chronic kidney failure, nephrotic syndrome, asplenia, or immunocompromised conditions. People who received 1-2 doses of PPSV23 before age 65 years should receive another dose of PPSV23 vaccine at age 65 years or later if at least 5 years have passed since the previous dose. Doses of PPSV23 are not needed for people immunized with PPSV23 at or after age 65 years.  Meningococcal vaccine. Adults with asplenia or persistent complement component deficiencies should receive 2 doses of quadrivalent meningococcal conjugate (MenACWY-D) vaccine. The doses should be obtained at least 2 months apart.  Microbiologists working with certain meningococcal bacteria, military recruits, people at risk during an outbreak, and people who travel to or live in countries with a high rate of meningitis should be immunized. A first-year college student up through age   21 years who is living in a residence hall should receive a dose if she did not receive a dose on or after her 16th birthday. Adults who have certain high-risk conditions should receive one or more doses of vaccine.  Hepatitis A vaccine. Adults who wish to be protected from this disease, have certain high-risk conditions, work with hepatitis A-infected animals, work in hepatitis A research labs, or travel to or work in countries with a high rate of hepatitis A should be immunized. Adults who were previously unvaccinated and who anticipate close contact with an international adoptee during the first 60 days after arrival in the [place] from a country with a high rate of hepatitis A should be immunized.  Hepatitis B vaccine. Adults who wish to be protected from this disease, have certain high-risk conditions, may be exposed to blood or other infectious body fluids, are household contacts or sex partners of hepatitis B positive people, are clients or workers in certain care facilities, or travel to or work in countries with a high rate of hepatitis B should be immunized.  Haemophilus influenzae type b (Hib) vaccine. A previously unvaccinated person with asplenia or sickle cell disease or having a scheduled splenectomy should receive 1 dose of Hib vaccine. Regardless of previous immunization, a recipient of a hematopoietic stem cell transplant should receive a 3-dose series 6-12 months after her successful transplant. Hib vaccine is not recommended for adults with HIV infection. Preventive Services / Frequency Ages 64 to 68 years  Blood pressure check.** / Every 1 to 2 years.  Lipid and cholesterol check.** / Every 5 years beginning at age  22.  Clinical breast exam.** / Every 3 years for women in their 88s and 53s.  BRCA-related cancer risk assessment.** / For women who have family members with a BRCA-related cancer (breast, ovarian, tubal, or peritoneal cancers).  Pap test.** / Every 2 years from ages 90 through 51. Every 3 years starting at age 21 through age 56 or 3 with a history of 3 consecutive normal Pap tests.  HPV screening.** / Every 3 years from ages 24 through ages 1 to 46 with a history of 3 consecutive normal Pap tests.  Hepatitis C blood test.** / For any individual with known risks for hepatitis C.  Skin self-exam. / Monthly.  Influenza vaccine. / Every year.  Tetanus, diphtheria, and acellular pertussis (Tdap, Td) vaccine.** / Consult your health care provider. Pregnant women should receive 1 dose of Tdap vaccine during each pregnancy. 1 dose of Td every 10 years.  Varicella vaccine.** / Consult your health care provider. Pregnant females who do not have evidence of immunity should receive the first dose after pregnancy.  HPV vaccine. / 3 doses over 6 months, if 72 and younger. The vaccine is not recommended for use in pregnant females. However, pregnancy testing is not needed before receiving a dose.  Measles, mumps, rubella (MMR) vaccine.** / You need at least 1 dose of MMR if you were born in 1957 or later. You may also need a 2nd dose. For females of childbearing age, rubella immunity should be determined. If there is no evidence of immunity, females who are not pregnant should be vaccinated. If there is no evidence of immunity, females who are pregnant should delay immunization until after pregnancy.  Pneumococcal 13-valent conjugate (PCV13) vaccine.** / Consult your health care provider.  Pneumococcal polysaccharide (PPSV23) vaccine.** / 1 to 2 doses if you smoke cigarettes or if you have certain conditions.  Meningococcal vaccine.** /  1 dose if you are age 19 to 21 years and a first-year college  student living in a residence hall, or have one of several medical conditions, you need to get vaccinated against meningococcal disease. You may also need additional booster doses.  Hepatitis A vaccine.** / Consult your health care provider.  Hepatitis B vaccine.** / Consult your health care provider.  Haemophilus influenzae type b (Hib) vaccine.** / Consult your health care provider. Ages 40 to 64 years  Blood pressure check.** / Every 1 to 2 years.  Lipid and cholesterol check.** / Every 5 years beginning at age 20 years.  Lung cancer screening. / Every year if you are aged 55-80 years and have a 30-pack-year history of smoking and currently smoke or have quit within the past 15 years. Yearly screening is stopped once you have quit smoking for at least 15 years or develop a health problem that would prevent you from having lung cancer treatment.  Clinical breast exam.** / Every year after age 40 years.  BRCA-related cancer risk assessment.** / For women who have family members with a BRCA-related cancer (breast, ovarian, tubal, or peritoneal cancers).  Mammogram.** / Every year beginning at age 40 years and continuing for as long as you are in good health. Consult with your health care provider.  Pap test.** / Every 3 years starting at age 30 years through age 65 or 70 years with a history of 3 consecutive normal Pap tests.  HPV screening.** / Every 3 years from ages 30 years through ages 65 to 70 years with a history of 3 consecutive normal Pap tests.  Fecal occult blood test (FOBT) of stool. / Every year beginning at age 50 years and continuing until age 75 years. You may not need to do this test if you get a colonoscopy every 10 years.  Flexible sigmoidoscopy or colonoscopy.** / Every 5 years for a flexible sigmoidoscopy or every 10 years for a colonoscopy beginning at age 50 years and continuing until age 75 years.  Hepatitis C blood test.** / For all people born from 1945 through  1965 and any individual with known risks for hepatitis C.  Skin self-exam. / Monthly.  Influenza vaccine. / Every year.  Tetanus, diphtheria, and acellular pertussis (Tdap/Td) vaccine.** / Consult your health care provider. Pregnant women should receive 1 dose of Tdap vaccine during each pregnancy. 1 dose of Td every 10 years.  Varicella vaccine.** / Consult your health care provider. Pregnant females who do not have evidence of immunity should receive the first dose after pregnancy.  Zoster vaccine.** / 1 dose for adults aged 60 years or older.  Measles, mumps, rubella (MMR) vaccine.** / You need at least 1 dose of MMR if you were born in 1957 or later. You may also need a 2nd dose. For females of childbearing age, rubella immunity should be determined. If there is no evidence of immunity, females who are not pregnant should be vaccinated. If there is no evidence of immunity, females who are pregnant should delay immunization until after pregnancy.  Pneumococcal 13-valent conjugate (PCV13) vaccine.** / Consult your health care provider.  Pneumococcal polysaccharide (PPSV23) vaccine.** / 1 to 2 doses if you smoke cigarettes or if you have certain conditions.  Meningococcal vaccine.** / Consult your health care provider.  Hepatitis A vaccine.** / Consult your health care provider.  Hepatitis B vaccine.** / Consult your health care provider.  Haemophilus influenzae type b (Hib) vaccine.** / Consult your health care provider. Ages 65   years and over  Blood pressure check.** / Every 1 to 2 years.  Lipid and cholesterol check.** / Every 5 years beginning at age 22 years.  Lung cancer screening. / Every year if you are aged 73-80 years and have a 30-pack-year history of smoking and currently smoke or have quit within the past 15 years. Yearly screening is stopped once you have quit smoking for at least 15 years or develop a health problem that would prevent you from having lung cancer  treatment.  Clinical breast exam.** / Every year after age 4 years.  BRCA-related cancer risk assessment.** / For women who have family members with a BRCA-related cancer (breast, ovarian, tubal, or peritoneal cancers).  Mammogram.** / Every year beginning at age 40 years and continuing for as long as you are in good health. Consult with your health care provider.  Pap test.** / Every 3 years starting at age 9 years through age 34 or 91 years with 3 consecutive normal Pap tests. Testing can be stopped between 65 and 70 years with 3 consecutive normal Pap tests and no abnormal Pap or HPV tests in the past 10 years.  HPV screening.** / Every 3 years from ages 57 years through ages 64 or 45 years with a history of 3 consecutive normal Pap tests. Testing can be stopped between 65 and 70 years with 3 consecutive normal Pap tests and no abnormal Pap or HPV tests in the past 10 years.  Fecal occult blood test (FOBT) of stool. / Every year beginning at age 15 years and continuing until age 17 years. You may not need to do this test if you get a colonoscopy every 10 years.  Flexible sigmoidoscopy or colonoscopy.** / Every 5 years for a flexible sigmoidoscopy or every 10 years for a colonoscopy beginning at age 86 years and continuing until age 71 years.  Hepatitis C blood test.** / For all people born from [birth year] through 1965 and any individual with known risks for hepatitis C.  Osteoporosis screening.** / A one-time screening for women ages 83 years and over and women at risk for fractures or osteoporosis.  Skin self-exam. / Monthly.  Influenza vaccine. / Every year.  Tetanus, diphtheria, and acellular pertussis (Tdap/Td) vaccine.** / 1 dose of Td every 10 years.  Varicella vaccine.** / Consult your health care provider.  Zoster vaccine.** / 1 dose for adults aged 61 years or older.  Pneumococcal 13-valent conjugate (PCV13) vaccine.** / Consult your health care provider.  Pneumococcal  polysaccharide (PPSV23) vaccine.** / 1 dose for all adults aged 28 years and older.  Meningococcal vaccine.** / Consult your health care provider.  Hepatitis A vaccine.** / Consult your health care provider.  Hepatitis B vaccine.** / Consult your health care provider.  Haemophilus influenzae type b (Hib) vaccine.** / Consult your health care provider. ** Family history and personal history of risk and conditions may change your health care provider's recommendations. Document Released: 04/18/2001 Document Revised: 07/07/2013 Document Reviewed: 07/18/2010 [facility] Patient Information 2015 [place], [place]. This information is not intended to replace advice given to you by your health care provider. Make sure you discuss any questions you have with your health care provider.

## 2013-10-19 MED ORDER — LEVOTHYROXINE SODIUM 137 MCG PO TABS: 137.0000 ug | ORAL_TABLET | Freq: Every day | ORAL | Status: DC

## 2013-10-29 ENCOUNTER — Telehealth: Payer: Self-pay | Admitting: Internal Medicine

## 2013-10-29 ENCOUNTER — Encounter: Payer: Self-pay | Admitting: *Deleted

## 2013-10-29 NOTE — Telephone Encounter (Signed)
Spoke with patient. She has had issues with spells of diarrhea. She has bloating and discomfort. She thinks there is a link to her diet, but she wants to be certain. She travels as much as she can now that she is retired. Denies bloody stool or fevers. Appointment scheduled.

## 2013-10-30 NOTE — Progress Notes (Signed)
   Subjective:   HPI  The patient presents for a follow-up of  chronic hypertension, chronic dyslipidemia, PVD, hypothyroidism controlled with medicines; elev glu  Wt Readings from Last 3 Encounters:  10/08/13 156 lb (70.761 kg)  06/24/13 157 lb (71.215 kg)  06/04/13 158 lb (71.668 kg)   BP Readings from Last 3 Encounters:  10/08/13 133/78  06/24/13 118/64  06/04/13 140/78        Review of Systems  Constitutional: Negative for chills, activity change, appetite change, fatigue and unexpected weight change.  HENT: Negative for congestion, mouth sores and sinus pressure.   Eyes: Negative for visual disturbance.  Respiratory: Negative for cough and chest tightness.   Gastrointestinal: Negative for nausea and abdominal pain.  Genitourinary: Negative for frequency, difficulty urinating and vaginal pain.  Musculoskeletal: Negative for back pain and gait problem.  Skin: Negative for pallor and rash.  Neurological: Negative for dizziness, tremors, weakness, numbness and headaches.  Psychiatric/Behavioral: Negative for suicidal ideas, confusion and sleep disturbance.       Objective:   Physical Exam  Constitutional: She appears well-developed. No distress.  HENT:  Head: Normocephalic.  Right Ear: External ear normal.  Left Ear: External ear normal.  Nose: Nose normal.  Mouth/Throat: Oropharynx is clear and moist.  Eyes: Conjunctivae are normal. Pupils are equal, round, and reactive to light. Right eye exhibits no discharge. Left eye exhibits no discharge.  Neck: Normal range of motion. Neck supple. No JVD present. No tracheal deviation present. No thyromegaly present.  Cardiovascular: Normal rate, regular rhythm and normal heart sounds.   Pulmonary/Chest: No stridor. No respiratory distress. She has no wheezes.  Abdominal: Soft. Bowel sounds are normal. She exhibits no distension and no mass. There is no tenderness. There is no rebound and no guarding.  Musculoskeletal: She  exhibits no edema and no tenderness.  Lymphadenopathy:    She has no cervical adenopathy.  Neurological: She displays normal reflexes. No cranial nerve deficit. She exhibits normal muscle tone. Coordination normal.  Skin: No rash noted. No erythema.  Psychiatric: She has a normal mood and affect. Her behavior is normal. Judgment and thought content normal.     Lab Results  Component Value Date   WBC 6.6 10/07/2013   HGB 14.9 10/07/2013   HCT 43.7 10/07/2013   PLT 184.0 10/07/2013   GLUCOSE 97 10/07/2013   CHOL 207* 10/07/2013   TRIG 168.0* 10/07/2013   HDL 42.00 10/07/2013   LDLDIRECT 158.1 11/24/2008   LDLCALC 131* 10/07/2013   ALT 24 10/07/2013   AST 28 10/07/2013   NA 140 10/07/2013   K 4.4 10/07/2013   CL 105 10/07/2013   CREATININE 0.9 10/07/2013   BUN 8 10/07/2013   CO2 27 10/07/2013   TSH 0.05* 10/07/2013   HGBA1C 5.5 10/10/2012        Assessment & Plan:

## 2013-10-30 NOTE — Addendum Note (Signed)
Addended by: Cassandria Anger on: 10/30/2013 05:31 PM   Modules accepted: Level of Service

## 2013-11-06 ENCOUNTER — Encounter: Payer: Self-pay | Admitting: Gastroenterology

## 2013-11-06 ENCOUNTER — Ambulatory Visit (INDEPENDENT_AMBULATORY_CARE_PROVIDER_SITE_OTHER): Payer: Medicare Other | Admitting: Gastroenterology

## 2013-11-06 VITALS — BP 112/54 | HR 60 | Ht 64.5 in | Wt 156.5 lb

## 2013-11-06 DIAGNOSIS — K589 Irritable bowel syndrome without diarrhea: Secondary | ICD-10-CM

## 2013-11-06 NOTE — Patient Instructions (Signed)
Fiber sheet given today, please take a fiber supplement from the sheet and take once daily.  Please start Florastor a probiotic, can be purchased over the counter. Information given today.  You have a follow up visit scheduled with Dr. Carlean Purl for  01-08-2014 at 830 am

## 2013-11-06 NOTE — Progress Notes (Signed)
11/06/2013 Lauren Mcgee [id number] [birth year]   HISTORY OF PRESENT ILLNESS:  This is a pleasant 73 year old female who is previously known to Dr. Carlean Purl for colonoscopies.  Her last colonoscopy was in 01/2009 at which time she was found to have moderate diverticulosis.  Due to a previous history of colon polyps it was recommended that she have another colonoscopy in 7 years from that time.  She presents to our office today with long-standing complaints of bloating/gas, abdominal discomfort, and bowel movements that alternate between constipation and diarrhea.  She says that none of these symptoms are new; they have all been present for several years and are unchanged.  She says that her bloating/gas and abdominal discomforts are always relieved by having a BM.  Symptoms never wake her from sleep at night.  She denies seeing blood in her stool.  She says that her appetite is good.  She is on thyroid medication and they have been trying to regulate that.  CBC, BMP, and hepatic function panel were all WNL's.   She carries a diagnosis of IBS.   Past Medical History  Diagnosis Date  . Hyperlipidemia   . PVD (peripheral vascular disease)   . Hypertension   . GERD (gastroesophageal reflux disease)   . Hx of colonic polyp 2001    TUBULAR ADENOMA  . Hypothyroidism   . Thyroid cancer 2011  . Diverticulosis of colon (without mention of hemorrhage)    Past Surgical History  Procedure Laterality Date  . Thyroidectomy      and 131I treatment at [facility]- Dr. Celine Ahr  . Colonoscopy  2010    562.10    reports that she quit smoking about 55 years ago. Her smoking use included Cigarettes. She smoked 0.50 packs per day. She has never used smokeless tobacco. She reports that she does not drink alcohol or use illicit drugs. family history includes Colon cancer in her paternal aunt; Hyperlipidemia in her mother; Hypertension in her other. There is no history of Colon polyps or Diabetes. Allergies    Allergen Reactions  . Influenza Vaccines     Local reaction      Outpatient Encounter Prescriptions as of 11/06/2013  Medication Sig  . aspirin 81 MG chewable tablet Chew 81 mg by mouth daily.    Marland Kitchen atenolol (TENORMIN) 25 MG tablet Take 1 tablet (25 mg total) by mouth daily.  Marland Kitchen b complex vitamins capsule Take 1 capsule by mouth daily.  . cholecalciferol (VITAMIN D) 1000 UNITS tablet Take 1,000 Units by mouth daily.    Marland Kitchen Cod Liver Oil 1000 MG CAPS Take 2 capsules by mouth daily.  Marland Kitchen escitalopram (LEXAPRO) 10 MG tablet Take 1 tablet (10 mg total) by mouth daily.  . lansoprazole (PREVACID) 30 MG capsule Take 30 mg by mouth as needed.   . Omega-3 Fatty Acids (FISH OIL) 1000 MG CAPS Take 1-2 capsules by mouth daily.  . rosuvastatin (CRESTOR) 10 MG tablet Take 1 tablet (10 mg total) by mouth daily.  Marland Kitchen SYNTHROID 100 MCG tablet Take 1 tablet (100 mcg total) by mouth daily before breakfast.  . [DISCONTINUED] cyanocobalamin 1000 MCG tablet Take 1,000 mcg by mouth.    . [DISCONTINUED] fluticasone (FLONASE) 50 MCG/ACT nasal spray   . [DISCONTINUED] levothyroxine (LEVOTHROID) 137 MCG tablet Take 1 tablet (137 mcg total) by mouth daily before breakfast.  . [DISCONTINUED] omega-3 acid ethyl esters (LOVAZA) 1 G capsule Take 2 g by mouth daily.    . [DISCONTINUED] PROAIR  HFA 108 (90 BASE) MCG/ACT inhaler      REVIEW OF SYSTEMS  : All other systems reviewed and negative except where noted in the History of Present Illness.   PHYSICAL EXAM: BP 112/54  Pulse 60  Ht 5' 4.5" (1.638 m)  Wt 156 lb 8 oz (70.988 kg)  BMI 26.46 kg/m2 General: Well developed white female in no acute distress Head: Normocephalic and atraumatic Eyes:  Sclerae anicteric, conjunctiva pink. Ears: Normal auditory acuity Lungs: Clear throughout to auscultation Heart: Regular rate and rhythm Abdomen: Soft, non-distended.  Normal bowel sounds.  Non-tender. Musculoskeletal: Symmetrical with no gross deformities  Skin: No lesions  on visible extremities Extremities: No edema  Neurological: Alert oriented x 4, grossly non-focal Psychological:  Alert and cooperative. Normal mood and affect  ASSESSMENT AND PLAN: -Abdominal pain, bloating, alternating constipation and diarrhea:  All of these symptoms have been present for several years and are unchanged.  She has a diagnosis of IBS, which is likely the cause of her symptoms.  She also has thyroid issues and it appears that they have been working to get her medication adjusted, which could be contributing to her symptoms as well.  Will start her on a daily probiotic, preferably Florastor, as well as daily fiber supplement.  She will follow-up in 6-8 weeks for reassessment.

## 2013-11-07 NOTE — Progress Notes (Signed)
Agree with Ms. Zehr's management.  Carl E. Gessner, MD, FACG  

## 2013-12-24 ENCOUNTER — Other Ambulatory Visit: Payer: Self-pay | Admitting: Internal Medicine

## 2014-01-08 ENCOUNTER — Ambulatory Visit: Payer: Medicare Other | Admitting: Internal Medicine

## 2014-03-10 ENCOUNTER — Ambulatory Visit (INDEPENDENT_AMBULATORY_CARE_PROVIDER_SITE_OTHER): Payer: Medicare Other | Admitting: Internal Medicine

## 2014-03-10 ENCOUNTER — Encounter: Payer: Self-pay | Admitting: Internal Medicine

## 2014-03-10 VITALS — BP 120/60 | HR 69 | Ht 65.5 in | Wt 155.4 lb

## 2014-03-10 DIAGNOSIS — K5730 Diverticulosis of large intestine without perforation or abscess without bleeding: Secondary | ICD-10-CM | POA: Diagnosis not present

## 2014-03-10 DIAGNOSIS — K589 Irritable bowel syndrome without diarrhea: Secondary | ICD-10-CM | POA: Diagnosis not present

## 2014-03-10 MED ORDER — METHYLCELLULOSE (LAXATIVE) 500 MG PO TABS: 2.0000 | ORAL_TABLET | Freq: Every day | ORAL | Status: DC

## 2014-03-10 MED ORDER — SACCHAROMYCES BOULARDII 250 MG PO CAPS: 250.0000 mg | ORAL_CAPSULE | Freq: Every day | ORAL | Status: DC | PRN

## 2014-03-10 NOTE — Patient Instructions (Signed)
Please continue your current regimen and follow up with Dr Carlean Purl as needed.   I appreciate the opportunity to care for you.

## 2014-03-10 NOTE — Progress Notes (Signed)
   Subjective:    Patient ID: Lauren Mcgee, female    DOB: [birth year], 74 y.o.   MRN: [id number]  HPI Patient returns after she saw Alonza Bogus [facility]. At that time and IBS flare was treated with fiber and probiotics using Florastor and she has done well. She does relate variable bowel habits either loose or some days where she skips her has constipation and stools are somewhat thin at times. But they'll return to normal. She also has known diverticulosis. Medications, allergies, past medical history, past surgical history, family history and social history are reviewed and updated in the EMR.  Review of Systems As above    Objective:   Physical Exam Well-developed well-nourished elderly woman in no acute distress    Assessment & Plan:   1. IBS (irritable bowel syndrome)   2. Diverticulosis of colon without hemorrhage    She will continue with current therapy. We discussed timing of her next colonoscopy, and though she had polyps on her first 2 colonoscopies they were diminutive and only 2 each, she has not had any polyps in 2006 or 2010 so anticipate a routine repeat colonoscopy in 2017 as planned. Sooner should signs or symptoms suggest.

## 2014-04-15 ENCOUNTER — Ambulatory Visit: Payer: Medicare Other | Admitting: Internal Medicine

## 2014-04-24 ENCOUNTER — Other Ambulatory Visit: Payer: Self-pay | Admitting: Internal Medicine

## 2014-05-04 ENCOUNTER — Ambulatory Visit (INDEPENDENT_AMBULATORY_CARE_PROVIDER_SITE_OTHER): Payer: Medicare Other | Admitting: Internal Medicine

## 2014-05-04 ENCOUNTER — Encounter: Payer: Self-pay | Admitting: Internal Medicine

## 2014-05-04 VITALS — BP 138/60 | HR 74 | Temp 97.5°F | Wt 157.8 lb

## 2014-05-04 DIAGNOSIS — K219 Gastro-esophageal reflux disease without esophagitis: Secondary | ICD-10-CM

## 2014-05-04 DIAGNOSIS — M79605 Pain in left leg: Secondary | ICD-10-CM

## 2014-05-04 DIAGNOSIS — I10 Essential (primary) hypertension: Secondary | ICD-10-CM | POA: Diagnosis not present

## 2014-05-04 DIAGNOSIS — M79604 Pain in right leg: Secondary | ICD-10-CM | POA: Diagnosis not present

## 2014-05-04 DIAGNOSIS — E890 Postprocedural hypothyroidism: Secondary | ICD-10-CM | POA: Diagnosis not present

## 2014-05-04 MED ORDER — MELOXICAM 7.5 MG PO TABS: 7.5000 mg | ORAL_TABLET | Freq: Every day | ORAL | Status: DC

## 2014-05-04 NOTE — Assessment & Plan Note (Addendum)
2/16 B lateral thighs - MSK L>R vs Crestor-induced IT band stretch Meloxicam Rx x 2 weeks  OK to hold Crestor x 1 mo

## 2014-05-04 NOTE — Patient Instructions (Addendum)
[email or web address]:    IT band stretch OK to hold Crestor x 1 month

## 2014-05-04 NOTE — Assessment & Plan Note (Signed)
Cont w/Prevacid Rx

## 2014-05-04 NOTE — Progress Notes (Signed)
Pre visit review using our clinic review tool, if applicable. No additional management support is needed unless otherwise documented below in the visit note. 

## 2014-05-04 NOTE — Assessment & Plan Note (Signed)
Cont w/Atenolol Rx 

## 2014-05-04 NOTE — Progress Notes (Signed)
   Subjective:   HPI  C/o L lateral thigh in am - dull and 5/10 - better w/exercise The patient presents for a follow-up of  chronic hypertension, chronic dyslipidemia, PVD, hypothyroidism controlled with medicines; elev glu  Wt Readings from Last 3 Encounters:  05/04/14 157 lb 12 oz (71.555 kg)  03/10/14 155 lb 6.4 oz (70.489 kg)  11/06/13 156 lb 8 oz (70.988 kg)   BP Readings from Last 3 Encounters:  05/04/14 138/60  03/10/14 120/60  11/06/13 112/54        Review of Systems  Constitutional: Negative for chills, activity change, appetite change, fatigue and unexpected weight change.  HENT: Negative for congestion, mouth sores and sinus pressure.   Eyes: Negative for visual disturbance.  Respiratory: Negative for cough and chest tightness.   Gastrointestinal: Negative for nausea and abdominal pain.  Genitourinary: Negative for frequency, difficulty urinating and vaginal pain.  Musculoskeletal: Negative for back pain and gait problem.  Skin: Negative for pallor and rash.  Neurological: Negative for dizziness, tremors, weakness, numbness and headaches.  Psychiatric/Behavioral: Negative for suicidal ideas, confusion and sleep disturbance.       Objective:   Physical Exam  Constitutional: She appears well-developed. No distress.  HENT:  Head: Normocephalic.  Right Ear: External ear normal.  Left Ear: External ear normal.  Nose: Nose normal.  Mouth/Throat: Oropharynx is clear and moist.  Eyes: Conjunctivae are normal. Pupils are equal, round, and reactive to light. Right eye exhibits no discharge. Left eye exhibits no discharge.  Neck: Normal range of motion. Neck supple. No JVD present. No tracheal deviation present. No thyromegaly present.  Cardiovascular: Normal rate, regular rhythm and normal heart sounds.   Pulmonary/Chest: No stridor. No respiratory distress. She has no wheezes.  Abdominal: Soft. Bowel sounds are normal. She exhibits no distension and no mass. There  is no tenderness. There is no rebound and no guarding.  Musculoskeletal: She exhibits no edema or tenderness.  Lymphadenopathy:    She has no cervical adenopathy.  Neurological: She displays normal reflexes. No cranial nerve deficit. She exhibits normal muscle tone. Coordination normal.  Skin: No rash noted. No erythema.  Psychiatric: She has a normal mood and affect. Her behavior is normal. Judgment and thought content normal.   B hips, legs - WNL  Lab Results  Component Value Date   WBC 6.6 10/07/2013   HGB 14.9 10/07/2013   HCT 43.7 10/07/2013   PLT 184.0 10/07/2013   GLUCOSE 97 10/07/2013   CHOL 207* 10/07/2013   TRIG 168.0* 10/07/2013   HDL 42.00 10/07/2013   LDLDIRECT 158.1 11/24/2008   LDLCALC 131* 10/07/2013   ALT 24 10/07/2013   AST 28 10/07/2013   NA 140 10/07/2013   K 4.4 10/07/2013   CL 105 10/07/2013   CREATININE 0.9 10/07/2013   BUN 8 10/07/2013   CO2 27 10/07/2013   TSH 0.05* 10/07/2013   HGBA1C 5.5 10/10/2012        Assessment & Plan:

## 2014-05-04 NOTE — Assessment & Plan Note (Signed)
Chronic post-surgical Managed by her Endo - Dr Cruzita Lederer Cont w/Synthroid Rx

## 2014-06-17 DIAGNOSIS — J209 Acute bronchitis, unspecified: Secondary | ICD-10-CM | POA: Diagnosis not present

## 2014-06-17 DIAGNOSIS — J0100 Acute maxillary sinusitis, unspecified: Secondary | ICD-10-CM | POA: Diagnosis not present

## 2014-07-28 ENCOUNTER — Other Ambulatory Visit: Payer: Self-pay | Admitting: Internal Medicine

## 2014-07-29 ENCOUNTER — Ambulatory Visit: Payer: Medicare Other | Admitting: Internal Medicine

## 2014-07-30 ENCOUNTER — Other Ambulatory Visit: Payer: Self-pay | Admitting: *Deleted

## 2014-07-30 MED ORDER — SYNTHROID 100 MCG PO TABS: 100.0000 ug | ORAL_TABLET | Freq: Every day | ORAL | Status: DC

## 2014-08-04 ENCOUNTER — Telehealth: Payer: Self-pay | Admitting: Geriatric Medicine

## 2014-08-04 NOTE — Telephone Encounter (Signed)
Spoke with patient. She has not had a mammogram. She is going to call and schedule it herself. She will have them fax [place] a copy of the results when she gets it done.

## 2014-09-03 DIAGNOSIS — L820 Inflamed seborrheic keratosis: Secondary | ICD-10-CM | POA: Diagnosis not present

## 2014-09-03 DIAGNOSIS — L814 Other melanin hyperpigmentation: Secondary | ICD-10-CM | POA: Diagnosis not present

## 2014-09-03 DIAGNOSIS — D485 Neoplasm of uncertain behavior of skin: Secondary | ICD-10-CM | POA: Diagnosis not present

## 2014-10-12 ENCOUNTER — Ambulatory Visit (INDEPENDENT_AMBULATORY_CARE_PROVIDER_SITE_OTHER): Payer: Medicare Other | Admitting: Internal Medicine

## 2014-10-12 ENCOUNTER — Encounter: Payer: Self-pay | Admitting: Internal Medicine

## 2014-10-12 VITALS — BP 118/64 | HR 81 | Temp 98.2°F | Resp 12 | Wt 155.0 lb

## 2014-10-12 DIAGNOSIS — Z85850 Personal history of malignant neoplasm of thyroid: Secondary | ICD-10-CM

## 2014-10-12 DIAGNOSIS — E890 Postprocedural hypothyroidism: Secondary | ICD-10-CM

## 2014-10-12 LAB — TSH: TSH: 0.06 u[IU]/mL — ABNORMAL LOW (ref 0.35–4.50)

## 2014-10-12 LAB — T4, FREE: Free T4: 1.07 ng/dL (ref 0.60–1.60)

## 2014-10-12 NOTE — Progress Notes (Signed)
Patient ID: Lauren Mcgee, female   DOB: 01-22-1941, 74 y.o.   MRN: [id number]   HPI  Lauren Mcgee is a 74 y.o.-year-old female, returning for f/u for Thyroid Cancer in remission, with  Postsurgical hypothyroidism. Last visit 3 mo ago.  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 No nodes sampled. One of the papillary tumors was approaching and may have involved the surgical resection margin. There was minimal extra thyroid though invasion of one of the papillary microcarcinoma's, but no lymphovascular invasion. - RAI tx with 154 mCi I-131 on 03/16/2010. At that time TSH was>100, thyroglobulin was 0.7, ATA<20 - Post treatment WBS did not show metastatic disease. There were foci of activity which were previously seen on the pretherapy scan. No new lesions identified. - she was followed by her surgeon q6 mo.  - 12/02/2012: Tg <0.2, ATA<20, TSH 0.21, fT4 1.15 - Last U/S of thyroid was on 12/09/2012: Post thyroidectomy. No evidence of residual thyroid tissue or mass/nodule at the thyroid bed. - 06/24/2013: Tg <0.2, ATA<20, TSH 0.02, fT4 1.22  Postsurgical hypothyroidism: She is on Synthroid 100 mcg - brand name. She takes this: - fasting - with water - separated by 1h from b'fast  - takes calcium 1000 mg with lunch or dinner - + Florastor in am - stopped PPIs (Prevacid) - was taking it at night  I reviewed pt's thyroid tests: Lab Results  Component Value Date   TSH 0.05* 10/07/2013   TSH 0.02* 06/24/2013   TSH 0.03* 03/14/2013   TSH 0.21* 12/02/2012   TSH 0.03* 10/10/2012   TSH 0.03* 01/31/2012   TSH 0.02* 07/05/2010   TSH 0.02* 05/09/2010   TSH 1.66 11/24/2008   TSH 1.94 05/30/2007   FREET4 1.22 06/24/2013   FREET4 1.40 03/14/2013   FREET4 1.15 12/02/2012   FREET4  1.60 10/10/2012   Pt denies feeling nodules in neck, + hoarseness (especially when she sings), no dysphagia/odynophagia, SOB with lying down;   She c/o: - no weight loss - no heat intolerance - no palpitations - no fatigue - no constipation - no dry skin/sweating/hair falling - no anxiety/depression  She is on Atenolol 25 (per cardiology).  She also has a history of HTN,HL (Crestor >> fatigue), PVD, GERD.   ROS: Constitutional: no weight gain/loss, no fatigue Eyes: no blurry vision, no xerophthalmia ENT: no sore throat, no nodules palpated in throat, no dysphagia/odynophagia, + decreased hearing Cardiovascular: no CP/SOB/palpitations/leg swelling Respiratory: + cough/no SOB Gastrointestinal: no N/V/D/C/ heartburn Musculoskeletal: no muscle/joint aches Skin: no rashes Neurological: no tremors/numbness/tingling/dizziness  I reviewed pt's medications, allergies, PMH, social hx, family hx, and changes were documented in the history of present illness. Otherwise, unchanged from my initial visit note.  PE: BP 118/64 mmHg  Pulse 81  Temp(Src) 98.2 F (36.8 C) (Oral)  Resp 12  Wt 155 lb (70.308 kg)  SpO2 96% Body mass index is 25.39 kg/(m^2). Wt Readings from Last 3 Encounters:  10/12/14 155 lb (70.308 kg)  05/04/14 157 lb 12 oz (71.555 kg)  03/10/14 155 lb 6.4 oz (70.489 kg)   Constitutional: overweight, in NAD Eyes: PERRLA, EOMI, no exophthalmos ENT: moist mucous membranes, no thyroid masses; cervical scar inconspicuous, no cervical lymphadenopathy Cardiovascular: RRR, No MRG Respiratory: CTA B Gastrointestinal: abdomen soft, NT, ND, BS+ Musculoskeletal: no deformities, strength intact  in all 4 Skin: moist, warm, no rashes Neurological: no tremor with outstretched hands, DTR normal in all 4  ASSESSMENT: 1. Hypothyroidism  2. History of thyroid cancer  PLAN:  1. Patient with long-standing hypothyroidism, on levothyroxine therapy. She appears euthyroid. She feels  better on the lower Synthroid dose - and no more hot flushes. - We again discussed about correct intake of levothyroxine, fasting, with water, separated by at least 30 minutes from breakfast, and separated by more than 4 hours from calcium, iron, multivitamins, acid reflux medications (PPIs). She is taking this correcly. - She does not appear to have thyroid masses or cervical LAD - we'll check thyroid tests today: TSH, free T4 and will add Tg + ATA - If these are abnormal, she will need to return in 6-8 weeks for repeat labs - If these are normal, I will see her back in 1 year - needs refills 3 mo  2. PTC - in remission - reviewed and updated hx (see HPI) per records received from Dr Celine Ahr - will check Tg + ATA  Component     Latest Ref Rng 10/12/2014  TSH     0.35 - 4.50 uIU/mL 0.06 (L)  Free T4     0.60 - 1.60 ng/dL 1.07  Thyroglobulin Ab     <2 IU/mL <1  Thyroglobulin     2.8 - 40.9 ng/mL <0.1 (L)   Thyroglobulin and thyroglobulin antibodies are great! TSH is still suppressed, we'll need to back off even more on her Synthroid dose, to 88 g daily. I will call in a new prescription and she will need another recheck in 2 months. Will advised her not to take her B vitamins that morning.

## 2014-10-12 NOTE — Patient Instructions (Signed)
Please stop at the lab.  Please return in 1 year.  

## 2014-10-13 LAB — THYROGLOBULIN ANTIBODY: Thyroglobulin Ab: <1 IU/mL (ref <2)

## 2014-10-13 LAB — THYROGLOBULIN LEVEL

## 2014-10-13 MED ORDER — SYNTHROID 88 MCG PO TABS: 88.0000 ug | ORAL_TABLET | Freq: Every day | ORAL | Status: DC

## 2014-11-19 ENCOUNTER — Ambulatory Visit: Payer: Medicare Other | Admitting: Internal Medicine

## 2014-11-20 DIAGNOSIS — H2513 Age-related nuclear cataract, bilateral: Secondary | ICD-10-CM | POA: Diagnosis not present

## 2014-11-20 DIAGNOSIS — H52221 Regular astigmatism, right eye: Secondary | ICD-10-CM | POA: Diagnosis not present

## 2014-11-20 DIAGNOSIS — H47233 Glaucomatous optic atrophy, bilateral: Secondary | ICD-10-CM | POA: Diagnosis not present

## 2014-11-24 DIAGNOSIS — N3001 Acute cystitis with hematuria: Secondary | ICD-10-CM | POA: Diagnosis not present

## 2014-11-24 DIAGNOSIS — N3090 Cystitis, unspecified without hematuria: Secondary | ICD-10-CM | POA: Diagnosis not present

## 2014-12-15 DIAGNOSIS — H00011 Hordeolum externum right upper eyelid: Secondary | ICD-10-CM | POA: Diagnosis not present

## 2014-12-18 DIAGNOSIS — H00011 Hordeolum externum right upper eyelid: Secondary | ICD-10-CM | POA: Diagnosis not present

## 2015-01-17 DIAGNOSIS — J218 Acute bronchiolitis due to other specified organisms: Secondary | ICD-10-CM | POA: Diagnosis not present

## 2015-01-19 ENCOUNTER — Other Ambulatory Visit: Payer: Self-pay | Admitting: Internal Medicine

## 2015-01-21 ENCOUNTER — Encounter: Payer: Self-pay | Admitting: Internal Medicine

## 2015-02-02 DIAGNOSIS — H47233 Glaucomatous optic atrophy, bilateral: Secondary | ICD-10-CM | POA: Diagnosis not present

## 2015-02-02 DIAGNOSIS — H2513 Age-related nuclear cataract, bilateral: Secondary | ICD-10-CM | POA: Diagnosis not present

## 2015-03-18 DIAGNOSIS — R05 Cough: Secondary | ICD-10-CM | POA: Diagnosis not present

## 2015-03-18 DIAGNOSIS — J189 Pneumonia, unspecified organism: Secondary | ICD-10-CM | POA: Diagnosis not present

## 2015-03-23 DIAGNOSIS — J069 Acute upper respiratory infection, unspecified: Secondary | ICD-10-CM | POA: Diagnosis not present

## 2015-03-23 DIAGNOSIS — R509 Fever, unspecified: Secondary | ICD-10-CM | POA: Diagnosis not present

## 2015-04-21 ENCOUNTER — Other Ambulatory Visit: Payer: Self-pay | Admitting: Internal Medicine

## 2015-05-05 ENCOUNTER — Encounter: Payer: Self-pay | Admitting: Internal Medicine

## 2015-05-05 ENCOUNTER — Other Ambulatory Visit (INDEPENDENT_AMBULATORY_CARE_PROVIDER_SITE_OTHER): Payer: Medicare Other

## 2015-05-05 ENCOUNTER — Ambulatory Visit (INDEPENDENT_AMBULATORY_CARE_PROVIDER_SITE_OTHER): Payer: Medicare Other | Admitting: Internal Medicine

## 2015-05-05 VITALS — BP 138/86 | HR 69 | Ht 66.0 in | Wt 155.0 lb

## 2015-05-05 DIAGNOSIS — Z86010 Personal history of colonic polyps: Secondary | ICD-10-CM

## 2015-05-05 DIAGNOSIS — I10 Essential (primary) hypertension: Secondary | ICD-10-CM | POA: Diagnosis not present

## 2015-05-05 DIAGNOSIS — E890 Postprocedural hypothyroidism: Secondary | ICD-10-CM | POA: Diagnosis not present

## 2015-05-05 DIAGNOSIS — R739 Hyperglycemia, unspecified: Secondary | ICD-10-CM

## 2015-05-05 DIAGNOSIS — Z85850 Personal history of malignant neoplasm of thyroid: Secondary | ICD-10-CM

## 2015-05-05 DIAGNOSIS — E039 Hypothyroidism, unspecified: Secondary | ICD-10-CM

## 2015-05-05 DIAGNOSIS — Z0000 Encounter for general adult medical examination without abnormal findings: Secondary | ICD-10-CM | POA: Diagnosis not present

## 2015-05-05 DIAGNOSIS — N3090 Cystitis, unspecified without hematuria: Secondary | ICD-10-CM | POA: Insufficient documentation

## 2015-05-05 LAB — CBC WITH DIFFERENTIAL/PLATELET
BASOS PCT: 0.7 % (ref 0.0–3.0)
Basophils Absolute: 0.1 10*3/uL (ref 0.0–0.1)
EOS ABS: 0.1 10*3/uL (ref 0.0–0.7)
EOS PCT: 1.4 % (ref 0.0–5.0)
HEMATOCRIT: 41.8 % (ref 36.0–46.0)
HEMOGLOBIN: 14.4 g/dL (ref 12.0–15.0)
LYMPHS PCT: 20.8 % (ref 12.0–46.0)
Lymphs Abs: 1.6 10*3/uL (ref 0.7–4.0)
MCHC: 34.5 g/dL (ref 30.0–36.0)
MCV: 87 fl (ref 78.0–100.0)
Monocytes Absolute: 0.5 10*3/uL (ref 0.1–1.0)
Monocytes Relative: 7 % (ref 3.0–12.0)
NEUTROS ABS: 5.4 10*3/uL (ref 1.4–7.7)
Neutrophils Relative %: 70.1 % (ref 43.0–77.0)
PLATELETS: 184 10*3/uL (ref 150.0–400.0)
RBC: 4.81 Mil/uL (ref 3.87–5.11)
RDW: 13.9 % (ref 11.5–15.5)
WBC: 7.7 10*3/uL (ref 4.0–10.5)

## 2015-05-05 LAB — HEMOGLOBIN A1C: Hgb A1c MFr Bld: 5.5 % (ref 4.6–6.5)

## 2015-05-05 LAB — HEPATIC FUNCTION PANEL
ALT: 17 U/L (ref 0–35)
AST: 22 U/L (ref 0–37)
Albumin: 4.5 g/dL (ref 3.5–5.2)
Alkaline Phosphatase: 41 U/L (ref 39–117)
BILIRUBIN DIRECT: 0 mg/dL (ref 0.0–0.3)
TOTAL PROTEIN: 7.2 g/dL (ref 6.0–8.3)
Total Bilirubin: 0.6 mg/dL (ref 0.2–1.2)

## 2015-05-05 LAB — BASIC METABOLIC PANEL WITH GFR
BUN: 9 mg/dL (ref 6–23)
CO2: 27 meq/L (ref 19–32)
Calcium: 9.1 mg/dL (ref 8.4–10.5)
Chloride: 105 meq/L (ref 96–112)
Creatinine, Ser: 0.78 mg/dL (ref 0.40–1.20)
GFR: 76.64 mL/min
Glucose, Bld: 99 mg/dL (ref 70–99)
Potassium: 3.8 meq/L (ref 3.5–5.1)
Sodium: 139 meq/L (ref 135–145)

## 2015-05-05 LAB — URINALYSIS, ROUTINE W REFLEX MICROSCOPIC
BILIRUBIN URINE: NEGATIVE
HGB URINE DIPSTICK: NEGATIVE
Ketones, ur: NEGATIVE
NITRITE: NEGATIVE
Specific Gravity, Urine: 1.01 (ref 1.000–1.030)
TOTAL PROTEIN, URINE-UPE24: NEGATIVE
URINE GLUCOSE: NEGATIVE
UROBILINOGEN UA: 0.2 (ref 0.0–1.0)
pH: 6 (ref 5.0–8.0)

## 2015-05-05 LAB — LIPID PANEL
CHOLESTEROL: 207 mg/dL — AB (ref 0–200)
HDL: 48.5 mg/dL (ref >39.00)
LDL Cholesterol: 130 mg/dL — ABNORMAL HIGH (ref 0–99)
NonHDL: 158.91
Total CHOL/HDL Ratio: 4
Triglycerides: 145 mg/dL (ref 0.0–149.0)
VLDL: 29 mg/dL (ref 0.0–40.0)

## 2015-05-05 LAB — TSH: TSH: 0.18 u[IU]/mL — AB (ref 0.35–4.50)

## 2015-05-05 MED ORDER — OSELTAMIVIR PHOSPHATE 75 MG PO CAPS: 75.0000 mg | ORAL_CAPSULE | Freq: Two times a day (BID) | ORAL | Status: DC

## 2015-05-05 MED ORDER — CIPROFLOXACIN HCL 250 MG PO TABS: 250.0000 mg | ORAL_TABLET | Freq: Two times a day (BID) | ORAL | Status: DC

## 2015-05-05 NOTE — Assessment & Plan Note (Signed)
Labs F/u w/Dr Cruzita Lederer

## 2015-05-05 NOTE — Progress Notes (Signed)
Pre visit review using our clinic review tool, if applicable. No additional management support is needed unless otherwise documented below in the visit note. 

## 2015-05-05 NOTE — Assessment & Plan Note (Signed)
Labs

## 2015-05-05 NOTE — Assessment & Plan Note (Signed)
Atenolol

## 2015-05-05 NOTE — Assessment & Plan Note (Signed)
Colon due 2017 Dr Carlean Purl

## 2015-05-05 NOTE — Progress Notes (Signed)
Subjective:  Patient ID: Lauren Mcgee, female    DOB: Jun 17, 1940  Age: 74 y.o. MRN: [id number]  CC: No chief complaint on file.   HPI Lauren Mcgee presents for a well exam Pt had bronchitis over christmas - a shot, Zpac - recovered F/u HTN, depression, hypothyroidism  Outpatient Prescriptions Prior to Visit  Medication Sig Dispense Refill  . aspirin 81 MG chewable tablet Chew 81 mg by mouth daily.      Marland Kitchen atenolol (TENORMIN) 25 MG tablet TAKE 1 TABLET (25 MG TOTAL) BY MOUTH DAILY. 90 tablet 3  . b complex vitamins capsule Take 1 capsule by mouth daily.    . cholecalciferol (VITAMIN D) 1000 UNITS tablet Take 1,000 Units by mouth daily.      Marland Kitchen Cod Liver Oil 1000 MG CAPS Take 2 capsules by mouth daily.    Marland Kitchen escitalopram (LEXAPRO) 10 MG tablet TAKE 1 TABLET BY MOUTH EVERY DAY 90 tablet 2  . lansoprazole (PREVACID) 30 MG capsule Take 30 mg by mouth as needed.     . meloxicam (MOBIC) 7.5 MG tablet Take 1 tablet (7.5 mg total) by mouth daily. 30 tablet 0  . Methylcellulose, Laxative, (CITRUCEL) 500 MG TABS Take 2 tablets (1,000 mg total) by mouth daily.  0  . Omega-3 Fatty Acids (FISH OIL) 1000 MG CAPS Take 1-2 capsules by mouth daily.    . rosuvastatin (CRESTOR) 10 MG tablet Take 1 tablet (10 mg total) by mouth daily. 90 tablet 1  . saccharomyces boulardii (FLORASTOR) 250 MG capsule Take 1 capsule (250 mg total) by mouth daily as needed.    Marland Kitchen SYNTHROID 88 MCG tablet Take 1 tablet (88 mcg total) by mouth daily before breakfast. 90 tablet 3   No facility-administered medications prior to visit.    ROS Review of Systems  Constitutional: Negative for chills, activity change, appetite change, fatigue and unexpected weight change.  HENT: Negative for congestion, mouth sores and sinus pressure.   Eyes: Negative for visual disturbance.  Respiratory: Negative for cough and chest tightness.   Gastrointestinal: Negative for nausea, vomiting and abdominal pain.  Genitourinary: Negative for  frequency, difficulty urinating and vaginal pain.  Musculoskeletal: Negative for back pain, gait problem and neck pain.  Skin: Negative for pallor and rash.  Neurological: Negative for dizziness, tremors, weakness, numbness and headaches.  Psychiatric/Behavioral: Negative for suicidal ideas, confusion and sleep disturbance.    Objective:  BP 138/86 mmHg  Pulse 69  Ht 5\' 6"  (1.676 m)  Wt 155 lb (70.308 kg)  BMI 25.03 kg/m2  SpO2 97%  BP Readings from Last 3 Encounters:  05/05/15 138/86  10/12/14 118/64  05/04/14 138/60    Wt Readings from Last 3 Encounters:  05/05/15 155 lb (70.308 kg)  10/12/14 155 lb (70.308 kg)  05/04/14 157 lb 12 oz (71.555 kg)    Physical Exam  Constitutional: She appears well-developed. No distress.  HENT:  Head: Normocephalic.  Right Ear: External ear normal.  Left Ear: External ear normal.  Nose: Nose normal.  Mouth/Throat: Oropharynx is clear and moist.  Eyes: Conjunctivae are normal. Pupils are equal, round, and reactive to light. Right eye exhibits no discharge. Left eye exhibits no discharge.  Neck: Normal range of motion. Neck supple. No JVD present. No tracheal deviation present. No thyromegaly present.  Cardiovascular: Normal rate, regular rhythm and normal heart sounds.   Pulmonary/Chest: No stridor. No respiratory distress. She has no wheezes.  Abdominal: Soft. Bowel sounds are normal. She exhibits no distension  and no mass. There is no tenderness. There is no rebound and no guarding.  Musculoskeletal: She exhibits no edema or tenderness.  Lymphadenopathy:    She has no cervical adenopathy.  Neurological: She displays normal reflexes. No cranial nerve deficit. She exhibits normal muscle tone. Coordination normal.  Skin: No rash noted. No erythema.  Psychiatric: She has a normal mood and affect. Her behavior is normal. Judgment and thought content normal.    Lab Results  Component Value Date   WBC 7.7 05/05/2015   HGB 14.4 05/05/2015     HCT 41.8 05/05/2015   PLT 184.0 05/05/2015   GLUCOSE 99 05/05/2015   CHOL 207* 05/05/2015   TRIG 145.0 05/05/2015   HDL 48.50 05/05/2015   LDLDIRECT 158.1 11/24/2008   LDLCALC 130* 05/05/2015   ALT 17 05/05/2015   AST 22 05/05/2015   NA 139 05/05/2015   K 3.8 05/05/2015   CL 105 05/05/2015   CREATININE 0.78 05/05/2015   BUN 9 05/05/2015   CO2 27 05/05/2015   TSH 0.18* 05/05/2015   HGBA1C 5.5 05/05/2015    US Soft Tissue Head/neck  12/09/2012  CLINICAL DATA:  Thyroid cancer post thyroidectomy and radioactive iodine ablation in 2010 EXAM: THYROID ULTRASOUND TECHNIQUE: Ultrasound examination of the thyroid gland and adjacent soft tissues was performed. COMPARISON:  None FINDINGS: Right thyroid lobe Surgically absent. No residual nodule or tissue identified at the right thyroid bed. Left thyroid lobe Surgically absent. No residual nodular tissue identified at the left thyroid bed. Isthmus Surgically absent. Lymphadenopathy None visualized. IMPRESSION: Post thyroidectomy. No evidence of residual thyroid tissue or mass/nodule at the thyroid bed. Electronically Signed   By: Lavonia Dana M.D.   On: 12/09/2012 14:09    Assessment & Plan:   Diagnoses and all orders for this visit:  Well adult exam -     CBC with Differential/Platelet; Future -     Basic metabolic panel; Future -     Hepatic function panel; Future -     Urinalysis; Future -     TSH; Future -     Lipid panel; Future -     Lipid panel; Future  Hx of thyroid cancer  Postoperative hypothyroidism  Hyperglycemia -     Hemoglobin A1c; Future  Essential hypertension  COLONIC POLYPS, HX OF  Other orders -     oseltamivir (TAMIFLU) 75 MG capsule; Take 1 capsule (75 mg total) by mouth 2 (two) times daily. -     ciprofloxacin (CIPRO) 250 MG tablet; Take 1 tablet (250 mg total) by mouth 2 (two) times daily.   I am having [name] start on oseltamivir and ciprofloxacin. I am also having her maintain her aspirin,  lansoprazole, cholecalciferol, Cod Liver Oil, b complex vitamins, Fish Oil, rosuvastatin, Methylcellulose (Laxative), saccharomyces boulardii, meloxicam, SYNTHROID, escitalopram, and atenolol.  Meds ordered this encounter  Medications  . oseltamivir (TAMIFLU) 75 MG capsule    Sig: Take 1 capsule (75 mg total) by mouth 2 (two) times daily.    Dispense:  10 capsule    Refill:  0  . ciprofloxacin (CIPRO) 250 MG tablet    Sig: Take 1 tablet (250 mg total) by mouth 2 (two) times daily.    Dispense:  8 tablet    Refill:  0     Follow-up: Return in about 1 year (around 05/04/2016) for Wellness Exam.  Walker Kehr, MD

## 2015-05-05 NOTE — Assessment & Plan Note (Signed)
Cipro po °

## 2015-05-05 NOTE — Assessment & Plan Note (Signed)
Here for medicare wellness/physical  Diet: heart healthy  Physical activity: not sedentary  Depression/mood screen: doing well  Hearing: intact to whispered voice  Visual acuity: grossly normal, performs annual eye exam  ADLs: capable  Fall risk: low to none  Home safety: good  Cognitive evaluation: intact to orientation, naming, recall and repetition  EOL planning: adv directives, full code/ I agree  I have personally reviewed and have noted  1. The patient's medical, surgical and social history  2. Their use of alcohol, tobacco or illicit drugs  3. Their current medications and supplements  4. The patient's functional ability including ADL's, fall risks, home safety risks and hearing or visual impairment.  5. Diet and physical activities  6. Evidence for depression or mood disorders 7. The roster of all physicians providing medical care to patient - is listed in the Snapshot section of the chart and reviewed today.    Today patient counseled on age appropriate routine health concerns for screening and prevention, each reviewed and up to date or declined. Immunizations reviewed and up to date or declined. Labs ordered and reviewed. Risk factors for depression reviewed and negative. Hearing function and visual acuity are intact. ADLs screened and addressed as needed. Functional ability and level of safety reviewed and appropriate. Education, counseling and referrals performed based on assessed risks today. Patient provided with a copy of personalized plan for preventive services.

## 2015-05-05 NOTE — Patient Instructions (Signed)
Preventive Care for Adults, Female A healthy lifestyle and preventive care can promote health and wellness. Preventive health guidelines for women include the following key practices.  A routine yearly physical is a good way to check with your health care provider about your health and preventive screening. It is a chance to share any concerns and updates on your health and to receive a thorough exam.  Visit your dentist for a routine exam and preventive care every 6 months. Brush your teeth twice a day and floss once a day. Good oral hygiene prevents tooth decay and gum disease.  The frequency of eye exams is based on your age, health, family medical history, use of contact lenses, and other factors. Follow your health care provider's recommendations for frequency of eye exams.  Eat a healthy diet. Foods like vegetables, fruits, whole grains, low-fat dairy products, and lean protein foods contain the nutrients you need without too many calories. Decrease your intake of foods high in solid fats, added sugars, and salt. Eat the right amount of calories for you.Get information about a proper diet from your health care provider, if necessary.  Regular physical exercise is one of the most important things you can do for your health. Most adults should get at least 150 minutes of moderate-intensity exercise (any activity that increases your heart rate and causes you to sweat) each week. In addition, most adults need muscle-strengthening exercises on 2 or more days a week.  Maintain a healthy weight. The body mass index (BMI) is a screening tool to identify possible weight problems. It provides an estimate of body fat based on height and weight. Your health care provider can find your BMI and can help you achieve or maintain a healthy weight.For adults 20 years and older:  A BMI below 18.5 is considered underweight.  A BMI of 18.5 to 24.9 is normal.  A BMI of 25 to 29.9 is considered overweight.  A  BMI of 30 and above is considered obese.  Maintain normal blood lipids and cholesterol levels by exercising and minimizing your intake of saturated fat. Eat a balanced diet with plenty of fruit and vegetables. Blood tests for lipids and cholesterol should begin at age 45 and be repeated every 5 years. If your lipid or cholesterol levels are high, you are over 50, or you are at high risk for heart disease, you may need your cholesterol levels checked more frequently.Ongoing high lipid and cholesterol levels should be treated with medicines if diet and exercise are not working.  If you smoke, find out from your health care provider how to quit. If you do not use tobacco, do not start.  Lung cancer screening is recommended for adults aged 45-80 years who are at high risk for developing lung cancer because of a history of smoking. A yearly low-dose CT scan of the lungs is recommended for people who have at least a 30-pack-year history of smoking and are a current smoker or have quit within the past 15 years. A pack year of smoking is smoking an average of 1 pack of cigarettes a day for 1 year (for example: 1 pack a day for 30 years or 2 packs a day for 15 years). Yearly screening should continue until the smoker has stopped smoking for at least 15 years. Yearly screening should be stopped for people who develop a health problem that would prevent them from having lung cancer treatment.  If you are pregnant, do not drink alcohol. If you are  breastfeeding, be very cautious about drinking alcohol. If you are not pregnant and choose to drink alcohol, do not have more than 1 drink per day. One drink is considered to be 12 ounces (355 mL) of beer, 5 ounces (148 mL) of wine, or 1.5 ounces (44 mL) of liquor.  Avoid use of street drugs. Do not share needles with anyone. Ask for help if you need support or instructions about stopping the use of drugs.  High blood pressure causes heart disease and increases the risk  of stroke. Your blood pressure should be checked at least every 1 to 2 years. Ongoing high blood pressure should be treated with medicines if weight loss and exercise do not work.  If you are 55-79 years old, ask your health care provider if you should take aspirin to prevent strokes.  Diabetes screening is done by taking a blood sample to check your blood glucose level after you have not eaten for a certain period of time (fasting). If you are not overweight and you do not have risk factors for diabetes, you should be screened once every 3 years starting at age 45. If you are overweight or obese and you are 40-70 years of age, you should be screened for diabetes every year as part of your cardiovascular risk assessment.  Breast cancer screening is essential preventive care for women. You should practice "breast self-awareness." This means understanding the normal appearance and feel of your breasts and may include breast self-examination. Any changes detected, no matter how small, should be reported to a health care provider. Women in their 20s and 30s should have a clinical breast exam (CBE) by a health care provider as part of a regular health exam every 1 to 3 years. After age 40, women should have a CBE every year. Starting at age 40, women should consider having a mammogram (breast X-ray test) every year. Women who have a family history of breast cancer should talk to their health care provider about genetic screening. Women at a high risk of breast cancer should talk to their health care providers about having an MRI and a mammogram every year.  Breast cancer gene (BRCA)-related cancer risk assessment is recommended for women who have family members with BRCA-related cancers. BRCA-related cancers include breast, ovarian, tubal, and peritoneal cancers. Having family members with these cancers may be associated with an increased risk for harmful changes (mutations) in the breast cancer genes BRCA1 and  BRCA2. Results of the assessment will determine the need for genetic counseling and BRCA1 and BRCA2 testing.  Your health care provider may recommend that you be screened regularly for cancer of the pelvic organs (ovaries, uterus, and vagina). This screening involves a pelvic examination, including checking for microscopic changes to the surface of your cervix (Pap test). You may be encouraged to have this screening done every 3 years, beginning at age 21.  For women ages 30-65, health care providers may recommend pelvic exams and Pap testing every 3 years, or they may recommend the Pap and pelvic exam, combined with testing for human papilloma virus (HPV), every 5 years. Some types of HPV increase your risk of cervical cancer. Testing for HPV may also be done on women of any age with unclear Pap test results.  Other health care providers may not recommend any screening for nonpregnant women who are considered low risk for pelvic cancer and who do not have symptoms. Ask your health care provider if a screening pelvic exam is right for   you.  If you have had past treatment for cervical cancer or a condition that could lead to cancer, you need Pap tests and screening for cancer for at least 20 years after your treatment. If Pap tests have been discontinued, your risk factors (such as having a new sexual partner) need to be reassessed to determine if screening should resume. Some women have medical problems that increase the chance of getting cervical cancer. In these cases, your health care provider may recommend more frequent screening and Pap tests.  Colorectal cancer can be detected and often prevented. Most routine colorectal cancer screening begins at the age of 50 years and continues through age 75 years. However, your health care provider may recommend screening at an earlier age if you have risk factors for colon cancer. On a yearly basis, your health care provider may provide home test kits to check  for hidden blood in the stool. Use of a small camera at the end of a tube, to directly examine the colon (sigmoidoscopy or colonoscopy), can detect the earliest forms of colorectal cancer. Talk to your health care provider about this at age 50, when routine screening begins. Direct exam of the colon should be repeated every 5-10 years through age 75 years, unless early forms of precancerous polyps or small growths are found.  People who are at an increased risk for hepatitis B should be screened for this virus. You are considered at high risk for hepatitis B if:  You were born in a country where hepatitis B occurs often. Talk with your health care provider about which countries are considered high risk.  Your parents were born in a high-risk country and you have not received a shot to protect against hepatitis B (hepatitis B vaccine).  You have HIV or AIDS.  You use needles to inject street drugs.  You live with, or have sex with, someone who has hepatitis B.  You get hemodialysis treatment.  You take certain medicines for conditions like cancer, organ transplantation, and autoimmune conditions.  Hepatitis C blood testing is recommended for all people born from 1945 through 1965 and any individual with known risks for hepatitis C.  Practice safe sex. Use condoms and avoid high-risk sexual practices to reduce the spread of sexually transmitted infections (STIs). STIs include gonorrhea, chlamydia, syphilis, trichomonas, herpes, HPV, and human immunodeficiency virus (HIV). Herpes, HIV, and HPV are viral illnesses that have no cure. They can result in disability, cancer, and death.  You should be screened for sexually transmitted illnesses (STIs) including gonorrhea and chlamydia if:  You are sexually active and are younger than 24 years.  You are older than 24 years and your health care provider tells you that you are at risk for this type of infection.  Your sexual activity has changed  since you were last screened and you are at an increased risk for chlamydia or gonorrhea. Ask your health care provider if you are at risk.  If you are at risk of being infected with HIV, it is recommended that you take a prescription medicine daily to prevent HIV infection. This is called preexposure prophylaxis (PrEP). You are considered at risk if:  You are sexually active and do not regularly use condoms or know the HIV status of your partner(s).  You take drugs by injection.  You are sexually active with a partner who has HIV.  Talk with your health care provider about whether you are at high risk of being infected with HIV. If   you choose to begin PrEP, you should first be tested for HIV. You should then be tested every 3 months for as long as you are taking PrEP.  Osteoporosis is a disease in which the bones lose minerals and strength with aging. This can result in serious bone fractures or breaks. The risk of osteoporosis can be identified using a bone density scan. Women ages 67 years and over and women at risk for fractures or osteoporosis should discuss screening with their health care providers. Ask your health care provider whether you should take a calcium supplement or vitamin D to reduce the rate of osteoporosis.  Menopause can be associated with physical symptoms and risks. Hormone replacement therapy is available to decrease symptoms and risks. You should talk to your health care provider about whether hormone replacement therapy is right for you.  Use sunscreen. Apply sunscreen liberally and repeatedly throughout the day. You should seek shade when your shadow is shorter than you. Protect yourself by wearing long sleeves, pants, a wide-brimmed hat, and sunglasses year round, whenever you are outdoors.  Once a month, do a whole body skin exam, using a mirror to look at the skin on your back. Tell your health care provider of new moles, moles that have irregular borders, moles that  are larger than a pencil eraser, or moles that have changed in shape or color.  Stay current with required vaccines (immunizations).  Influenza vaccine. All adults should be immunized every year.  Tetanus, diphtheria, and acellular pertussis (Td, Tdap) vaccine. Pregnant women should receive 1 dose of Tdap vaccine during each pregnancy. The dose should be obtained regardless of the length of time since the last dose. Immunization is preferred during the 27th-36th week of gestation. An adult who has not previously received Tdap or who does not know her vaccine status should receive 1 dose of Tdap. This initial dose should be followed by tetanus and diphtheria toxoids (Td) booster doses every 10 years. Adults with an unknown or incomplete history of completing a 3-dose immunization series with Td-containing vaccines should begin or complete a primary immunization series including a Tdap dose. Adults should receive a Td booster every 10 years.  Varicella vaccine. An adult without evidence of immunity to varicella should receive 2 doses or a second dose if she has previously received 1 dose. Pregnant females who do not have evidence of immunity should receive the first dose after pregnancy. This first dose should be obtained before leaving the health care facility. The second dose should be obtained 4-8 weeks after the first dose.  Human papillomavirus (HPV) vaccine. Females aged 13-26 years who have not received the vaccine previously should obtain the 3-dose series. The vaccine is not recommended for use in pregnant females. However, pregnancy testing is not needed before receiving a dose. If a female is found to be pregnant after receiving a dose, no treatment is needed. In that case, the remaining doses should be delayed until after the pregnancy. Immunization is recommended for any person with an immunocompromised condition through the age of 61 years if she did not get any or all doses earlier. During the  3-dose series, the second dose should be obtained 4-8 weeks after the first dose. The third dose should be obtained 24 weeks after the first dose and 16 weeks after the second dose.  Zoster vaccine. One dose is recommended for adults aged 30 years or older unless certain conditions are present.  Measles, mumps, and rubella (MMR) vaccine. Adults born  before 1957 generally are considered immune to measles and mumps. Adults born in 1957 or later should have 1 or more doses of MMR vaccine unless there is a contraindication to the vaccine or there is laboratory evidence of immunity to each of the three diseases. A routine second dose of MMR vaccine should be obtained at least 28 days after the first dose for students attending postsecondary schools, health care workers, or international travelers. People who received inactivated measles vaccine or an unknown type of measles vaccine during 1963-1967 should receive 2 doses of MMR vaccine. People who received inactivated mumps vaccine or an unknown type of mumps vaccine before 1979 and are at high risk for mumps infection should consider immunization with 2 doses of MMR vaccine. For females of childbearing age, rubella immunity should be determined. If there is no evidence of immunity, females who are not pregnant should be vaccinated. If there is no evidence of immunity, females who are pregnant should delay immunization until after pregnancy. Unvaccinated health care workers born before 1957 who lack laboratory evidence of measles, mumps, or rubella immunity or laboratory confirmation of disease should consider measles and mumps immunization with 2 doses of MMR vaccine or rubella immunization with 1 dose of MMR vaccine.  Pneumococcal 13-valent conjugate (PCV13) vaccine. When indicated, a person who is uncertain of his immunization history and has no record of immunization should receive the PCV13 vaccine. All adults 65 years of age and older should receive this  vaccine. An adult aged 19 years or older who has certain medical conditions and has not been previously immunized should receive 1 dose of PCV13 vaccine. This PCV13 should be followed with a dose of pneumococcal polysaccharide (PPSV23) vaccine. Adults who are at high risk for pneumococcal disease should obtain the PPSV23 vaccine at least 8 weeks after the dose of PCV13 vaccine. Adults older than 75 years of age who have normal immune system function should obtain the PPSV23 vaccine dose at least 1 year after the dose of PCV13 vaccine.  Pneumococcal polysaccharide (PPSV23) vaccine. When PCV13 is also indicated, PCV13 should be obtained first. All adults aged 65 years and older should be immunized. An adult younger than age 65 years who has certain medical conditions should be immunized. Any person who resides in a nursing home or long-term care facility should be immunized. An adult smoker should be immunized. People with an immunocompromised condition and certain other conditions should receive both PCV13 and PPSV23 vaccines. People with human immunodeficiency virus (HIV) infection should be immunized as soon as possible after diagnosis. Immunization during chemotherapy or radiation therapy should be avoided. Routine use of PPSV23 vaccine is not recommended for American Indians, [place] Natives, or people younger than 65 years unless there are medical conditions that require PPSV23 vaccine. When indicated, people who have unknown immunization and have no record of immunization should receive PPSV23 vaccine. One-time revaccination 5 years after the first dose of PPSV23 is recommended for people aged 19-64 years who have chronic kidney failure, nephrotic syndrome, asplenia, or immunocompromised conditions. People who received 1-2 doses of PPSV23 before age 65 years should receive another dose of PPSV23 vaccine at age 65 years or later if at least 5 years have passed since the previous dose. Doses of PPSV23 are not  needed for people immunized with PPSV23 at or after age 65 years.  Meningococcal vaccine. Adults with asplenia or persistent complement component deficiencies should receive 2 doses of quadrivalent meningococcal conjugate (MenACWY-D) vaccine. The doses should be obtained   at least 2 months apart. Microbiologists working with certain meningococcal bacteria, [place] recruits, people at risk during an outbreak, and people who travel to or live in countries with a high rate of meningitis should be immunized. A first-year college student up through age 34 years who is living in a residence hall should receive a dose if she did not receive a dose on or after her 16th birthday. Adults who have certain high-risk conditions should receive one or more doses of vaccine.  Hepatitis A vaccine. Adults who wish to be protected from this disease, have certain high-risk conditions, work with hepatitis A-infected animals, work in hepatitis A research labs, or travel to or work in countries with a high rate of hepatitis A should be immunized. Adults who were previously unvaccinated and who anticipate close contact with an international adoptee during the first 60 days after arrival in the [place] from a country with a high rate of hepatitis A should be immunized.  Hepatitis B vaccine. Adults who wish to be protected from this disease, have certain high-risk conditions, may be exposed to blood or other infectious body fluids, are household contacts or sex partners of hepatitis B positive people, are clients or workers in certain care facilities, or travel to or work in countries with a high rate of hepatitis B should be immunized.  Haemophilus influenzae type b (Hib) vaccine. A previously unvaccinated person with asplenia or sickle cell disease or having a scheduled splenectomy should receive 1 dose of Hib vaccine. Regardless of previous immunization, a recipient of a hematopoietic stem cell transplant should receive a  3-dose series 6-12 months after her successful transplant. Hib vaccine is not recommended for adults with HIV infection. Preventive Services / Frequency Ages 35 to 4 years  Blood pressure check.** / Every 3-5 years.  Lipid and cholesterol check.** / Every 5 years beginning at age 60.  Clinical breast exam.** / Every 3 years for women in their 71s and 10s.  BRCA-related cancer risk assessment.** / For women who have family members with a BRCA-related cancer (breast, ovarian, tubal, or peritoneal cancers).  Pap test.** / Every 2 years from ages 76 through 26. Every 3 years starting at age 61 through age 76 or 93 with a history of 3 consecutive normal Pap tests.  HPV screening.** / Every 3 years from ages 37 through ages 60 to 51 with a history of 3 consecutive normal Pap tests.  Hepatitis C blood test.** / For any individual with known risks for hepatitis C.  Skin self-exam. / Monthly.  Influenza vaccine. / Every year.  Tetanus, diphtheria, and acellular pertussis (Tdap, Td) vaccine.** / Consult your health care provider. Pregnant women should receive 1 dose of Tdap vaccine during each pregnancy. 1 dose of Td every 10 years.  Varicella vaccine.** / Consult your health care provider. Pregnant females who do not have evidence of immunity should receive the first dose after pregnancy.  HPV vaccine. / 3 doses over 6 months, if 93 and younger. The vaccine is not recommended for use in pregnant females. However, pregnancy testing is not needed before receiving a dose.  Measles, mumps, rubella (MMR) vaccine.** / You need at least 1 dose of MMR if you were born in 1957 or later. You may also need a 2nd dose. For females of childbearing age, rubella immunity should be determined. If there is no evidence of immunity, females who are not pregnant should be vaccinated. If there is no evidence of immunity, females who are  pregnant should delay immunization until after pregnancy.  Pneumococcal  13-valent conjugate (PCV13) vaccine.** / Consult your health care provider.  Pneumococcal polysaccharide (PPSV23) vaccine.** / 1 to 2 doses if you smoke cigarettes or if you have certain conditions.  Meningococcal vaccine.** / 1 dose if you are age 68 to 8 years and a Market researcher living in a residence hall, or have one of several medical conditions, you need to get vaccinated against meningococcal disease. You may also need additional booster doses.  Hepatitis A vaccine.** / Consult your health care provider.  Hepatitis B vaccine.** / Consult your health care provider.  Haemophilus influenzae type b (Hib) vaccine.** / Consult your health care provider. Ages 7 to 53 years  Blood pressure check.** / Every year.  Lipid and cholesterol check.** / Every 5 years beginning at age 25 years.  Lung cancer screening. / Every year if you are aged 11-80 years and have a 30-pack-year history of smoking and currently smoke or have quit within the past 15 years. Yearly screening is stopped once you have quit smoking for at least 15 years or develop a health problem that would prevent you from having lung cancer treatment.  Clinical breast exam.** / Every year after age 48 years.  BRCA-related cancer risk assessment.** / For women who have family members with a BRCA-related cancer (breast, ovarian, tubal, or peritoneal cancers).  Mammogram.** / Every year beginning at age 41 years and continuing for as long as you are in good health. Consult with your health care provider.  Pap test.** / Every 3 years starting at age 65 years through age 37 or 70 years with a history of 3 consecutive normal Pap tests.  HPV screening.** / Every 3 years from ages 72 years through ages 60 to 40 years with a history of 3 consecutive normal Pap tests.  Fecal occult blood test (FOBT) of stool. / Every year beginning at age 21 years and continuing until age 5 years. You may not need to do this test if you get  a colonoscopy every 10 years.  Flexible sigmoidoscopy or colonoscopy.** / Every 5 years for a flexible sigmoidoscopy or every 10 years for a colonoscopy beginning at age 35 years and continuing until age 48 years.  Hepatitis C blood test.** / For all people born from [birth year] through 1965 and any individual with known risks for hepatitis C.  Skin self-exam. / Monthly.  Influenza vaccine. / Every year.  Tetanus, diphtheria, and acellular pertussis (Tdap/Td) vaccine.** / Consult your health care provider. Pregnant women should receive 1 dose of Tdap vaccine during each pregnancy. 1 dose of Td every 10 years.  Varicella vaccine.** / Consult your health care provider. Pregnant females who do not have evidence of immunity should receive the first dose after pregnancy.  Zoster vaccine.** / 1 dose for adults aged 30 years or older.  Measles, mumps, rubella (MMR) vaccine.** / You need at least 1 dose of MMR if you were born in 1957 or later. You may also need a second dose. For females of childbearing age, rubella immunity should be determined. If there is no evidence of immunity, females who are not pregnant should be vaccinated. If there is no evidence of immunity, females who are pregnant should delay immunization until after pregnancy.  Pneumococcal 13-valent conjugate (PCV13) vaccine.** / Consult your health care provider.  Pneumococcal polysaccharide (PPSV23) vaccine.** / 1 to 2 doses if you smoke cigarettes or if you have certain conditions.  Meningococcal vaccine.** /  Consult your health care provider.  Hepatitis A vaccine.** / Consult your health care provider.  Hepatitis B vaccine.** / Consult your health care provider.  Haemophilus influenzae type b (Hib) vaccine.** / Consult your health care provider. Ages 64 years and over  Blood pressure check.** / Every year.  Lipid and cholesterol check.** / Every 5 years beginning at age 23 years.  Lung cancer screening. / Every year if you  are aged 16-80 years and have a 30-pack-year history of smoking and currently smoke or have quit within the past 15 years. Yearly screening is stopped once you have quit smoking for at least 15 years or develop a health problem that would prevent you from having lung cancer treatment.  Clinical breast exam.** / Every year after age 74 years.  BRCA-related cancer risk assessment.** / For women who have family members with a BRCA-related cancer (breast, ovarian, tubal, or peritoneal cancers).  Mammogram.** / Every year beginning at age 44 years and continuing for as long as you are in good health. Consult with your health care provider.  Pap test.** / Every 3 years starting at age 58 years through age 22 or 39 years with 3 consecutive normal Pap tests. Testing can be stopped between 65 and 70 years with 3 consecutive normal Pap tests and no abnormal Pap or HPV tests in the past 10 years.  HPV screening.** / Every 3 years from ages 64 years through ages 70 or 61 years with a history of 3 consecutive normal Pap tests. Testing can be stopped between 65 and 70 years with 3 consecutive normal Pap tests and no abnormal Pap or HPV tests in the past 10 years.  Fecal occult blood test (FOBT) of stool. / Every year beginning at age 40 years and continuing until age 27 years. You may not need to do this test if you get a colonoscopy every 10 years.  Flexible sigmoidoscopy or colonoscopy.** / Every 5 years for a flexible sigmoidoscopy or every 10 years for a colonoscopy beginning at age 7 years and continuing until age 32 years.  Hepatitis C blood test.** / For all people born from [birth year] through 1965 and any individual with known risks for hepatitis C.  Osteoporosis screening.** / A one-time screening for women ages 30 years and over and women at risk for fractures or osteoporosis.  Skin self-exam. / Monthly.  Influenza vaccine. / Every year.  Tetanus, diphtheria, and acellular pertussis (Tdap/Td)  vaccine.** / 1 dose of Td every 10 years.  Varicella vaccine.** / Consult your health care provider.  Zoster vaccine.** / 1 dose for adults aged 35 years or older.  Pneumococcal 13-valent conjugate (PCV13) vaccine.** / Consult your health care provider.  Pneumococcal polysaccharide (PPSV23) vaccine.** / 1 dose for all adults aged 46 years and older.  Meningococcal vaccine.** / Consult your health care provider.  Hepatitis A vaccine.** / Consult your health care provider.  Hepatitis B vaccine.** / Consult your health care provider.  Haemophilus influenzae type b (Hib) vaccine.** / Consult your health care provider. ** Family history and personal history of risk and conditions may change your health care provider's recommendations.   This information is not intended to replace advice given to you by your health care provider. Make sure you discuss any questions you have with your health care provider.   Document Released: 04/18/2001 Document Revised: 03/13/2014 Document Reviewed: 07/18/2010 Elsevier Interactive Patient Education [employer].

## 2015-05-06 ENCOUNTER — Telehealth: Payer: Self-pay | Admitting: Internal Medicine

## 2015-05-06 NOTE — Telephone Encounter (Signed)
Is requesting call back in regards to form on labs that [facility] called about today.

## 2015-05-07 NOTE — Telephone Encounter (Signed)
Pt called and upset stating someone in the lab gave her a paper with another pt name on it upon her labs being collected and exiting. Pt has now put the paper in an envelope and mailed it to the office. She is upset and wants to know how this was a mix up and is now questioning her labs. I am mailing her a copy of her labs and apologized of this error. Informed her that her paper may have been stuck to another but since this happened in the basement I am not sure exactly what happened. Management will be notified of this error and again an apology was given

## 2015-05-11 NOTE — Telephone Encounter (Signed)
Thurman Coyer, please submit an event on our Patient safety portal.

## 2015-05-18 ENCOUNTER — Telehealth: Payer: Self-pay

## 2015-05-18 NOTE — Telephone Encounter (Signed)
LVM for pt to call back as soon as possible.   RE: Flu vaccine 2016-2017

## 2015-05-24 ENCOUNTER — Other Ambulatory Visit (INDEPENDENT_AMBULATORY_CARE_PROVIDER_SITE_OTHER): Payer: Medicare Other

## 2015-05-24 DIAGNOSIS — E890 Postprocedural hypothyroidism: Secondary | ICD-10-CM

## 2015-05-24 LAB — T4, FREE: Free T4: 1.18 ng/dL (ref 0.60–1.60)

## 2015-05-24 LAB — TSH: TSH: 0.16 u[IU]/mL — ABNORMAL LOW (ref 0.35–4.50)

## 2015-05-25 ENCOUNTER — Other Ambulatory Visit: Payer: Self-pay | Admitting: *Deleted

## 2015-05-25 DIAGNOSIS — E039 Hypothyroidism, unspecified: Secondary | ICD-10-CM

## 2015-05-25 MED ORDER — SYNTHROID 75 MCG PO TABS: 75.0000 ug | ORAL_TABLET | Freq: Every day | ORAL | Status: DC

## 2015-06-07 DIAGNOSIS — Z1231 Encounter for screening mammogram for malignant neoplasm of breast: Secondary | ICD-10-CM | POA: Diagnosis not present

## 2015-06-07 DIAGNOSIS — Z803 Family history of malignant neoplasm of breast: Secondary | ICD-10-CM | POA: Diagnosis not present

## 2015-06-07 LAB — HM MAMMOGRAPHY

## 2015-06-09 ENCOUNTER — Encounter: Payer: Self-pay | Admitting: Internal Medicine

## 2015-06-16 ENCOUNTER — Telehealth: Payer: Self-pay | Admitting: Internal Medicine

## 2015-06-16 DIAGNOSIS — H9113 Presbycusis, bilateral: Secondary | ICD-10-CM

## 2015-06-16 NOTE — Telephone Encounter (Signed)
Pt request referral order by Dr. Camila Li for hearing problem ( cant hear good). She was wondering if we can send the referral to [facility] audiological Dr. Benny Lennert, fax # [phone]. Please call her back

## 2015-06-21 NOTE — Telephone Encounter (Signed)
Left massage inform pt that our [facility] will call of the appt date and time, referral is place.

## 2015-06-21 NOTE — Telephone Encounter (Signed)
Done. Thx.

## 2015-06-22 NOTE — Telephone Encounter (Signed)
Faxed to [facility] they will contact pt

## 2015-06-28 DIAGNOSIS — H903 Sensorineural hearing loss, bilateral: Secondary | ICD-10-CM | POA: Diagnosis not present

## 2015-10-12 ENCOUNTER — Ambulatory Visit (INDEPENDENT_AMBULATORY_CARE_PROVIDER_SITE_OTHER): Payer: Medicare Other | Admitting: Internal Medicine

## 2015-10-12 ENCOUNTER — Encounter: Payer: Self-pay | Admitting: Internal Medicine

## 2015-10-12 VITALS — BP 114/62 | HR 74 | Wt 160.0 lb

## 2015-10-12 DIAGNOSIS — E890 Postprocedural hypothyroidism: Secondary | ICD-10-CM | POA: Diagnosis not present

## 2015-10-12 DIAGNOSIS — Z85850 Personal history of malignant neoplasm of thyroid: Secondary | ICD-10-CM | POA: Diagnosis not present

## 2015-10-12 LAB — TSH: TSH: 0.78 u[IU]/mL (ref 0.35–4.50)

## 2015-10-12 LAB — T4, FREE: Free T4: 0.89 ng/dL (ref 0.60–1.60)

## 2015-10-12 NOTE — Progress Notes (Signed)
Patient ID: Lauren Mcgee, female   DOB: 1940/04/03, 75 y.o.   MRN: [id number]   HPI  Lauren Mcgee is a 75 y.o.-year-old female, returning for f/u for Thyroid Cancer in remission, and postsurgical hypothyroidism. Last visit 1 year ago.  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 No nodes sampled. One of the papillary tumors was approaching and may have involved the surgical resection margin. There was minimal extra thyroid though invasion of one of the papillary microcarcinoma's, but no lymphovascular invasion. - RAI tx with 154 mCi I-131 on 03/16/2010. At that time TSH was>100, thyroglobulin was 0.7, ATA<20 - Post treatment WBS did not show metastatic disease. There were foci of activity which were previously seen on the pretherapy scan. No new lesions identified. - she was followed by her surgeon q6 mo.   Most recent Neck U/S 12/09/2012: Post thyroidectomy. No evidence of residual thyroid tissue or mass/nodule at the thyroid bed.  Component     Latest Ref Rng & Units 12/02/2012 06/24/2013 10/12/2014  Thyroglobulin Ab     <2 IU/mL <20.0 <20.0 <1  Thyroglobulin     2.8 - 40.9 ng/mL <0.2 <0.2 <0.1 (L)    Postsurgical hypothyroidism: She is on Synthroid 100 >> 88 >> 75 mcg - brand name.   She takes this: - fasting - with water - separated by 1h from b'fast  - takes calcium 1000 mg with lunch or dinner - occasionally PPIs (Prevacid) - prn, at at night - added Magnesium with b'fast She is taking B vitamins  - not recently, though.  I reviewed pt's thyroid tests: Lab Results  Component Value Date   TSH 0.16 (L) 05/24/2015   TSH 0.18 (L) 05/05/2015   TSH 0.06 (L) 10/12/2014   TSH 0.05 (L) 10/07/2013   TSH 0.02 (L) 06/24/2013   TSH 0.03 (L) 03/14/2013   TSH 0.21 (L)  12/02/2012   TSH 0.03 (L) 10/10/2012   TSH 0.03 (L) 01/31/2012   TSH 0.02 (L) 07/05/2010   FREET4 1.18 05/24/2015   FREET4 1.07 10/12/2014   FREET4 1.22 06/24/2013   FREET4 1.40 03/14/2013   FREET4 1.15 12/02/2012   FREET4 1.60 10/10/2012   Pt denies feeling nodules in neck, + hoarseness (especially when she sings), no dysphagia/odynophagia, SOB with lying down;   She c/o: - no weight loss - no heat intolerance - no palpitations - no fatigue - no constipation - no dry skin/sweating/hair falling - no anxiety/depression  She is on Atenolol 25 (per cardiology).  She also has a history of HTN,HL (Crestor >> fatigue), PVD, GERD.   ROS: Constitutional: no weight gain/loss, no fatigue Eyes: no blurry vision, no xerophthalmia ENT: no sore throat, no nodules palpated in throat, no dysphagia/odynophagia, + decreased hearing Cardiovascular: no CP/SOB/palpitations/leg swelling Respiratory: no cough/no SOB Gastrointestinal: no N/V/D/C/ heartburn Musculoskeletal: no muscle/joint aches Skin: no rashes Neurological: no tremors/numbness/tingling/dizziness  I reviewed pt's medications, allergies, PMH, social hx, family hx, and changes were documented in the history of present illness. Otherwise, unchanged from my initial visit note.  PE: BP 114/62 (BP Location: Left Arm, Patient Position: Sitting)   Pulse 74   Wt 160 lb (72.6 kg)   SpO2 95%   BMI 25.82 kg/m  Body mass index is 25.82 kg/m. Wt Readings from Last 3 Encounters:  10/12/15 160  lb (72.6 kg)  05/05/15 155 lb (70.3 kg)  10/12/14 155 lb (70.3 kg)   Constitutional: overweight, in NAD Eyes: PERRLA, EOMI, no exophthalmos ENT: moist mucous membranes, no thyroid masses; cervical scar inconspicuous, no cervical lymphadenopathy Cardiovascular: RRR, No MRG Respiratory: CTA B Gastrointestinal: abdomen soft, NT, ND, BS+ Musculoskeletal: no deformities, strength intact in all 4 Skin: moist, warm, no rashes Neurological: no  tremor with outstretched hands, DTR normal in all 4  ASSESSMENT: 1. Hypothyroidism  2. History of thyroid cancer  PLAN:  1. Patient with long-standing hypothyroidism, on levothyroxine therapy. She appears euthyroid. She feels better on the lower Synthroid dose - no complaints. However, in 05/2015, since her TSH was low, I advised her to decrease the dose of Synthroid to 75 g daily and come back for labs in 6 weeks. She does not remember getting this message. Therefore, she continues on the 88 g of Synthroid. We'll check thyroid tests today: TSH, free T4  - We again discussed about correct intake of levothyroxine, fasting, with water, separated by at least 30 minutes from breakfast, and separated by more than 4 hours from calcium, iron, multivitamins, acid reflux medications (PPIs). She is taking this correcly. - If labs are abnormal, she will need to return in 6-8 weeks for repeat labs - If labs are normal, I will see her back in 6 mo  2. PTC - in remission - reviewed records received from Dr Celine Ahr - will check Tg + ATA today  Orders Placed This Encounter  Procedures  . T4, free  . TSH  . Thyroglobulin Level  . Thyroglobulin antibody   Needs refills 3 mo  Office Visit on 10/12/2015  Component Date Value Ref Range Status  . Free T4 10/12/2015 0.89  0.60 - 1.60 ng/dL Final  . TSH 10/12/2015 0.78  0.35 - 4.50 uIU/mL Final  . Thyroglobulin 10/13/2015 <0.1* 2.8 - 40.9 ng/mL Final   Comment: Thyroglobulin antibodies (TGAb) interfere with Thyroglobulin (TG) assays; therefore, Thyroglobulin antibody (TGAb) assay should always be performed in conjunction with a Thyroglobulin (TG) assay.   This test was performed using the Beckman Coulter chemiluminescent method.  Values obtained from different assay methods cannot be used interchangeably.  Thyroglobulin levels, regardless of value, should not be interpreted as absolute evidence of the presence or absence of disease.   .  Thyroglobulin Ab 10/13/2015 <1  <2 IU/mL Final   Excellent results!  Philemon Kingdom, MD PhD [facility]

## 2015-10-12 NOTE — Patient Instructions (Signed)
Please stop at the lab.  For now, continue the Synthroid 88 mcg daily.  Take the thyroid hormone every day, with water, at least 30 minutes before breakfast, separated by at least 4 hours from: - acid reflux medications - calcium - iron - multivitamins  Please come back for a follow-up appointment in 6 months.

## 2015-10-13 ENCOUNTER — Telehealth: Payer: Self-pay

## 2015-10-13 LAB — THYROGLOBULIN LEVEL

## 2015-10-13 LAB — THYROGLOBULIN ANTIBODY

## 2015-10-13 MED ORDER — SYNTHROID 88 MCG PO TABS: 88.0000 ug | ORAL_TABLET | Freq: Every day | ORAL | 3 refills | Status: DC

## 2015-10-13 NOTE — Telephone Encounter (Signed)
Called and left message about normal results. Advised patient to call back if she wanted specific results, left call back number.

## 2015-10-28 ENCOUNTER — Other Ambulatory Visit: Payer: Self-pay | Admitting: Internal Medicine

## 2015-11-19 DIAGNOSIS — N3091 Cystitis, unspecified with hematuria: Secondary | ICD-10-CM | POA: Diagnosis not present

## 2015-11-19 DIAGNOSIS — Z79899 Other long term (current) drug therapy: Secondary | ICD-10-CM | POA: Diagnosis not present

## 2015-11-19 DIAGNOSIS — I10 Essential (primary) hypertension: Secondary | ICD-10-CM | POA: Diagnosis not present

## 2016-01-19 ENCOUNTER — Encounter: Payer: Self-pay | Admitting: Internal Medicine

## 2016-01-19 ENCOUNTER — Ambulatory Visit (INDEPENDENT_AMBULATORY_CARE_PROVIDER_SITE_OTHER): Payer: Medicare Other | Admitting: Internal Medicine

## 2016-01-19 VITALS — BP 116/58 | HR 70 | Ht 66.0 in | Wt 161.2 lb

## 2016-01-19 DIAGNOSIS — K580 Irritable bowel syndrome with diarrhea: Secondary | ICD-10-CM

## 2016-01-19 DIAGNOSIS — Z86010 Personal history of colonic polyps: Secondary | ICD-10-CM

## 2016-01-19 DIAGNOSIS — K219 Gastro-esophageal reflux disease without esophagitis: Secondary | ICD-10-CM | POA: Diagnosis not present

## 2016-01-19 NOTE — Patient Instructions (Signed)
  You have been scheduled for a colonoscopy. Please follow written instructions given to you at your visit today.  Please pick up your prep supplies at the pharmacy. If you use inhalers (even only as needed), please bring them with you on the day of your procedure. Your physician has requested that you go to www.startemmi.com and enter the access code given to you at your visit today. This web site gives a general overview about your procedure. However, you should still follow specific instructions given to you by our office regarding your preparation for the procedure.    Take your Prevacid every day.    You may use your Imodium AD as needed.    I appreciate the opportunity to care for you. Silvano Rusk, MD, [facility]

## 2016-01-19 NOTE — Progress Notes (Signed)
   TYEISHA PAUTSCH 75 y.o. [birth year] [id number]  Assessment & Plan:   Encounter Diagnoses  Name Primary?  Marland Kitchen Hx of colonic polyps Yes  . Irritable bowel syndrome with diarrhea   . Gastroesophageal reflux disease, esophagitis presence not specified    Schedule colonoscopy The risks and benefits as well as alternatives of endoscopic procedure(s) have been discussed and reviewed. All questions answered. The patient agrees to proceed.  Take loperamide for prophylaxis of IBS-D  Take PPI qd not prn   Subjective:   Chief Complaint: IBS, GERD, hx colon polyps  HPI Very nice elderly ww here - time for repeat surveillance colonoscopy - hx polyps. Has intermittent urgent post-cibal diarrhea - salads and hot spicy foods sometimes trigger but nothing clearly persistent and repetitive. Spicy foods cause heartburn. She declined a trip to [facility] [facility] because of fears about IBS-D problems.  Loperamide taken before eating out works well. She never gets constipated.  Is having more heartburn and indigestion - she is not taking lansoprazole qd.   Wt Readings from Last 3 Encounters:  01/19/16 161 lb 4 oz (73.1 kg)  10/12/15 160 lb (72.6 kg)  05/05/15 155 lb (70.3 kg)   Medications, allergies, past medical history, past surgical history, family history and social history are reviewed and updated in the EMR.    Review of Systems As above No anxiety/stress  Objective:   Physical Exam @BP  (!) 116/58   Pulse 70   Ht 5\' 6"  (1.676 m)   Wt 161 lb 4 oz (73.1 kg)   BMI 26.03 kg/m @  General:  NAD Eyes:   anicteric Lungs:  clear Heart::  S1S2 no rubs, murmurs or gallops Abdomen:  soft and nontender, BS+ Ext:   no edema, cyanosis or clubbing    Data Reviewed:   Last colonoscopy  2010 - no polyps + diverticulosis "needs deep sedation next time"

## 2016-03-15 ENCOUNTER — Encounter: Payer: Medicare Other | Admitting: Internal Medicine

## 2016-03-21 ENCOUNTER — Encounter: Payer: Medicare Other | Admitting: Internal Medicine

## 2016-04-13 ENCOUNTER — Telehealth: Payer: Self-pay | Admitting: Internal Medicine

## 2016-04-13 ENCOUNTER — Ambulatory Visit: Payer: Medicare Other | Admitting: Internal Medicine

## 2016-04-13 NOTE — Telephone Encounter (Signed)
No show

## 2016-06-30 ENCOUNTER — Encounter: Payer: Self-pay | Admitting: Internal Medicine

## 2016-06-30 DIAGNOSIS — J209 Acute bronchitis, unspecified: Secondary | ICD-10-CM | POA: Diagnosis not present

## 2016-06-30 DIAGNOSIS — J9801 Acute bronchospasm: Secondary | ICD-10-CM | POA: Diagnosis not present

## 2016-07-06 DIAGNOSIS — J0100 Acute maxillary sinusitis, unspecified: Secondary | ICD-10-CM | POA: Diagnosis not present

## 2016-07-06 DIAGNOSIS — I10 Essential (primary) hypertension: Secondary | ICD-10-CM | POA: Diagnosis not present

## 2016-07-06 DIAGNOSIS — J209 Acute bronchitis, unspecified: Secondary | ICD-10-CM | POA: Diagnosis not present

## 2016-07-14 DIAGNOSIS — R05 Cough: Secondary | ICD-10-CM | POA: Diagnosis not present

## 2016-07-14 DIAGNOSIS — R0689 Other abnormalities of breathing: Secondary | ICD-10-CM | POA: Diagnosis not present

## 2016-07-24 DIAGNOSIS — J302 Other seasonal allergic rhinitis: Secondary | ICD-10-CM | POA: Diagnosis not present

## 2016-08-02 ENCOUNTER — Other Ambulatory Visit: Payer: Self-pay | Admitting: Internal Medicine

## 2016-08-08 ENCOUNTER — Encounter: Payer: Medicare Other | Admitting: Internal Medicine

## 2016-08-09 ENCOUNTER — Other Ambulatory Visit: Payer: Self-pay | Admitting: Internal Medicine

## 2016-08-14 NOTE — Progress Notes (Deleted)
Subjective:   Lauren Mcgee is a 76 y.o. female who presents for Medicare Annual (Subsequent) preventive examination.  Review of Systems:  No ROS.  Medicare Wellness Visit. Additional risk factors are reflected in the social history.    Sleep patterns: {SX; SLEEP PATTERNS:18802::"feels rested on waking","does not get up to void","gets up *** times nightly to void","sleeps *** hours nightly"}.   Home Safety/Smoke Alarms: Feels safe in home. Smoke alarms in place.  Living environment; residence and Firearm Safety: {Rehab home environment / accessibility:30080::"no firearms","firearms stored safely"}. Seat Belt Safety/Bike Helmet: Wears seat belt.   Counseling:   Eye Exam-  Dental-  Female:   Pap-  N/A     Mammo- Last 06/07/15, BI-RADS category 1: negative       Dexa scan- N/D      CCS- Last 01/05/09, normal, recall 10 years    Objective:     Vitals: There were no vitals taken for this visit.  There is no height or weight on file to calculate BMI.   Tobacco History  Smoking Status  . Former Smoker  . Packs/day: 0.50  . Types: Cigarettes  . Quit date: 03/06/1958  Smokeless Tobacco  . Never Used     Counseling given: Not Answered   Past Medical History:  Diagnosis Date  . Diverticulosis of colon (without mention of hemorrhage)   . GERD (gastroesophageal reflux disease)   . Hx of colonic polyp 2001   TUBULAR ADENOMA  . Hyperlipidemia   . Hypertension   . Hypothyroidism   . IBS (irritable bowel syndrome)   . PVD (peripheral vascular disease) ([place])   . Thyroid cancer ([place]) 2011   Past Surgical History:  Procedure Laterality Date  . COLONOSCOPY  2010   562.10  . THYROIDECTOMY     and 131I treatment at [facility]- Dr. Celine Ahr   Family History  Problem Relation Age of Onset  . Hyperlipidemia Mother   . Hypertension Other   . Colon cancer Paternal Aunt   . Colon polyps Neg Hx   . Diabetes Neg Hx    History  Sexual Activity  . Sexual activity: Yes    Outpatient  Encounter Prescriptions as of 08/15/2016  Medication Sig  . aspirin 81 MG chewable tablet Chew 81 mg by mouth daily.    Marland Kitchen atenolol (TENORMIN) 25 MG tablet Take 1 tablet (25 mg total) by mouth daily. Must keep June 12th appt for future refills  . b complex vitamins capsule Take 1 capsule by mouth daily.  . cholecalciferol (VITAMIN D) 1000 UNITS tablet Take 1,000 Units by mouth daily.    Marland Kitchen Cod Liver Oil 1000 MG CAPS Take 2 capsules by mouth daily.  . DENTA 5000 PLUS 1.1 % CREA dental cream   . lansoprazole (PREVACID) 30 MG capsule Take 30 mg by mouth as needed.   Marland Kitchen MAGNESIUM PO Take 250 mg by mouth daily.  . Omega-3 Fatty Acids (FISH OIL) 1000 MG CAPS Take 1-2 capsules by mouth daily.  Marland Kitchen saccharomyces boulardii (FLORASTOR) 250 MG capsule Take 1 capsule (250 mg total) by mouth daily as needed.  Marland Kitchen SYNTHROID 88 MCG tablet Take 1 tablet (88 mcg total) by mouth daily before breakfast.   No facility-administered encounter medications on file as of 08/15/2016.     Activities of Daily Living No flowsheet data found.  Patient Care Team: Plotnikov, Evie Lacks, MD as PCP - Delos Haring, MD as Consulting Physician (Internal Medicine)    Assessment:    Physical  assessment deferred to PCP.  Exercise Activities and Dietary recommendations   Diet (meal preparation, eat out, water intake, caffeinated beverages, dairy products, fruits and vegetables): {Desc; diets:16563} Breakfast: Lunch:  Dinner:      Goals    None     Fall Risk Fall Risk  05/05/2015 05/04/2014  Falls in the past year? No No   Depression Screen PHQ 2/9 Scores 05/05/2015 05/04/2014  PHQ - 2 Score 0 0     Cognitive Function        Immunization History  Administered Date(s) Administered  . Influenza Whole 11/26/2007  . Pneumococcal Conjugate-13 10/08/2013  . Pneumococcal Polysaccharide-23 11/24/2008  . Zoster 11/10/2010   Screening Tests Health Maintenance  Topic Date Due  . TETANUS/TDAP  11/19/1959  .  DEXA SCAN  11/18/2005  . INFLUENZA VACCINE  10/04/2016  . COLONOSCOPY  01/06/2019  . PNA vac Low Risk Adult  Completed      Plan:     I have personally reviewed and noted the following in the patient's chart:   . Medical and social history . Use of alcohol, tobacco or illicit drugs  . Current medications and supplements . Functional ability and status . Nutritional status . Physical activity . Advanced directives . List of other physicians . Vitals . Screenings to include cognitive, depression, and falls . Referrals and appointments  In addition, I have reviewed and discussed with patient certain preventive protocols, quality metrics, and best practice recommendations. A written personalized care plan for preventive services as well as general preventive health recommendations were provided to patient.     Michiel Cowboy, RN  08/14/2016

## 2016-08-14 NOTE — Progress Notes (Signed)
Pre visit review using our clinic review tool, if applicable. No additional management support is needed unless otherwise documented below in the visit note. 

## 2016-08-15 ENCOUNTER — Ambulatory Visit (INDEPENDENT_AMBULATORY_CARE_PROVIDER_SITE_OTHER): Payer: Medicare Other | Admitting: Internal Medicine

## 2016-08-15 ENCOUNTER — Encounter: Payer: Self-pay | Admitting: Internal Medicine

## 2016-08-15 VITALS — BP 126/72 | HR 77 | Temp 97.8°F | Ht 66.0 in | Wt 149.0 lb

## 2016-08-15 DIAGNOSIS — E890 Postprocedural hypothyroidism: Secondary | ICD-10-CM

## 2016-08-15 DIAGNOSIS — F411 Generalized anxiety disorder: Secondary | ICD-10-CM

## 2016-08-15 DIAGNOSIS — H9190 Unspecified hearing loss, unspecified ear: Secondary | ICD-10-CM | POA: Insufficient documentation

## 2016-08-15 DIAGNOSIS — Z0000 Encounter for general adult medical examination without abnormal findings: Secondary | ICD-10-CM

## 2016-08-15 DIAGNOSIS — I10 Essential (primary) hypertension: Secondary | ICD-10-CM

## 2016-08-15 DIAGNOSIS — K635 Polyp of colon: Secondary | ICD-10-CM

## 2016-08-15 DIAGNOSIS — H9193 Unspecified hearing loss, bilateral: Secondary | ICD-10-CM | POA: Diagnosis not present

## 2016-08-15 MED ORDER — ESCITALOPRAM OXALATE 10 MG PO TABS: 10.0000 mg | ORAL_TABLET | Freq: Every day | ORAL | 11 refills | Status: DC

## 2016-08-15 NOTE — Assessment & Plan Note (Signed)
Labs

## 2016-08-15 NOTE — Assessment & Plan Note (Signed)
Atenolol

## 2016-08-15 NOTE — Assessment & Plan Note (Addendum)
Here for medicare wellness/physical  Diet: heart healthy  Physical activity: not sedentary  Depression/mood screen: negative  Hearing: decreased to whispered voice  Visual acuity: grossly normal w/glasses, performs annual eye exam  ADLs: capable  Fall risk: low to none  Home safety: good  Cognitive evaluation: intact to orientation, naming, recall and repetition  EOL planning: adv directives, full code/ I agree  I have personally reviewed and have noted  1. The patient's medical, surgical and social history  2. Their use of alcohol, tobacco or illicit drugs  3. Their current medications and supplements  4. The patient's functional ability including ADL's, fall risks, home safety risks and hearing or visual impairment.  5. Diet and physical activities  6. Evidence for depression or mood disorders 7. The roster of all physicians providing medical care to patient - is listed in the Snapshot section of the chart and reviewed today.    Today patient counseled on age appropriate routine health concerns for screening and prevention, each reviewed and up to date or declined. Immunizations reviewed and up to date or declined. Labs ordered and reviewed. Risk factors for depression reviewed and negative. Hearing function and visual acuity are intact. ADLs screened and addressed as needed. Functional ability and level of safety reviewed and appropriate. Education, counseling and referrals performed based on assessed risks today. Patient provided with a copy of personalized plan for preventive services.   Audiol ref Colon due in 2017 Dr Carlean Purl

## 2016-08-15 NOTE — Assessment & Plan Note (Signed)
Lexapro 

## 2016-08-15 NOTE — Progress Notes (Signed)
Subjective:  Patient ID: Lauren Mcgee, female    DOB: 1940/03/21  Age: 76 y.o. MRN: [id number]  CC: No chief complaint on file.   HPI KHLOEE GARZA presents for a well exam She had bronchitis x 4 weeks. CXR was good. She used an MDI, abx  Outpatient Medications Prior to Visit  Medication Sig Dispense Refill  . aspirin 81 MG chewable tablet Chew 81 mg by mouth daily.      Marland Kitchen atenolol (TENORMIN) 25 MG tablet Take 1 tablet (25 mg total) by mouth daily. Must keep June 12th appt for future refills 30 tablet 0  . b complex vitamins capsule Take 1 capsule by mouth daily.    . cholecalciferol (VITAMIN D) 1000 UNITS tablet Take 1,000 Units by mouth daily.      Marland Kitchen Cod Liver Oil 1000 MG CAPS Take 2 capsules by mouth daily.    . DENTA 5000 PLUS 1.1 % CREA dental cream     . lansoprazole (PREVACID) 30 MG capsule Take 30 mg by mouth as needed.     Marland Kitchen MAGNESIUM PO Take 250 mg by mouth daily.    . Omega-3 Fatty Acids (FISH OIL) 1000 MG CAPS Take 1-2 capsules by mouth daily.    Marland Kitchen saccharomyces boulardii (FLORASTOR) 250 MG capsule Take 1 capsule (250 mg total) by mouth daily as needed.    Marland Kitchen SYNTHROID 88 MCG tablet Take 1 tablet (88 mcg total) by mouth daily before breakfast. 90 tablet 3   No facility-administered medications prior to visit.     ROS Review of Systems  Constitutional: Negative for activity change, appetite change, chills, fatigue and unexpected weight change.  HENT: Negative for congestion, mouth sores and sinus pressure.   Eyes: Negative for visual disturbance.  Respiratory: Negative for cough and chest tightness.   Gastrointestinal: Negative for abdominal pain and nausea.  Genitourinary: Negative for difficulty urinating, frequency and vaginal pain.  Musculoskeletal: Negative for back pain and gait problem.  Skin: Negative for pallor and rash.  Neurological: Negative for dizziness, tremors, weakness, numbness and headaches.  Psychiatric/Behavioral: Negative for confusion, sleep  disturbance and suicidal ideas.    Objective:  BP 126/72 (BP Location: Left Arm, Patient Position: Sitting, Cuff Size: Normal)   Pulse 77   Temp 97.8 F (36.6 C) (Oral)   Ht 5\' 6"  (1.676 m)   Wt 149 lb (67.6 kg)   SpO2 99%   BMI 24.05 kg/m   BP Readings from Last 3 Encounters:  08/15/16 126/72  01/19/16 (!) 116/58  10/12/15 114/62    Wt Readings from Last 3 Encounters:  08/15/16 149 lb (67.6 kg)  01/19/16 161 lb 4 oz (73.1 kg)  10/12/15 160 lb (72.6 kg)    Physical Exam  Constitutional: She appears well-developed. No distress.  HENT:  Head: Normocephalic.  Right Ear: External ear normal.  Left Ear: External ear normal.  Nose: Nose normal.  Mouth/Throat: Oropharynx is clear and moist.  Eyes: Conjunctivae are normal. Pupils are equal, round, and reactive to light. Right eye exhibits no discharge. Left eye exhibits no discharge.  Neck: Normal range of motion. Neck supple. No JVD present. No tracheal deviation present. No thyromegaly present.  Cardiovascular: Normal rate, regular rhythm and normal heart sounds.   Pulmonary/Chest: No stridor. No respiratory distress. She has no wheezes.  Abdominal: Soft. Bowel sounds are normal. She exhibits no distension and no mass. There is no tenderness. There is no rebound and no guarding.  Musculoskeletal: She exhibits no edema  or tenderness.  Lymphadenopathy:    She has no cervical adenopathy.  Neurological: She displays normal reflexes. No cranial nerve deficit. She exhibits normal muscle tone. Coordination normal.  Skin: No rash noted. No erythema.  Psychiatric: She has a normal mood and affect. Her behavior is normal. Judgment and thought content normal.    Lab Results  Component Value Date   WBC 7.7 05/05/2015   HGB 14.4 05/05/2015   HCT 41.8 05/05/2015   PLT 184.0 05/05/2015   GLUCOSE 99 05/05/2015   CHOL 207 (H) 05/05/2015   TRIG 145.0 05/05/2015   HDL 48.50 05/05/2015   LDLDIRECT 158.1 11/24/2008   LDLCALC 130 (H)  05/05/2015   ALT 17 05/05/2015   AST 22 05/05/2015   NA 139 05/05/2015   K 3.8 05/05/2015   CL 105 05/05/2015   CREATININE 0.78 05/05/2015   BUN 9 05/05/2015   CO2 27 05/05/2015   TSH 0.78 10/12/2015   HGBA1C 5.5 05/05/2015    US Soft Tissue Head/neck  Result Date: 12/09/2012 CLINICAL DATA:  Thyroid cancer post thyroidectomy and radioactive iodine ablation in 2010 EXAM: THYROID ULTRASOUND TECHNIQUE: Ultrasound examination of the thyroid gland and adjacent soft tissues was performed. COMPARISON:  None FINDINGS: Right thyroid lobe Surgically absent. No residual nodule or tissue identified at the right thyroid bed. Left thyroid lobe Surgically absent. No residual nodular tissue identified at the left thyroid bed. Isthmus Surgically absent. Lymphadenopathy None visualized. IMPRESSION: Post thyroidectomy. No evidence of residual thyroid tissue or mass/nodule at the thyroid bed. Electronically Signed   By: Lavonia Dana M.D.   On: 12/09/2012 14:09    Assessment & Plan:   There are no diagnoses linked to this encounter. I am having Ms. Hession maintain her aspirin, lansoprazole, cholecalciferol, Cod Liver Oil, b complex vitamins, Fish Oil, saccharomyces boulardii, MAGNESIUM PO, SYNTHROID, DENTA 5000 PLUS, atenolol, and escitalopram.  Meds ordered this encounter  Medications  . escitalopram (LEXAPRO) 10 MG tablet    Sig: Take 10 mg by mouth daily.     Follow-up: No Follow-up on file.  Walker Kehr, MD

## 2016-08-15 NOTE — Assessment & Plan Note (Addendum)
Dr Colonel Bald in [facility]

## 2016-08-15 NOTE — Assessment & Plan Note (Signed)
Colon w/dr Carlean Purl

## 2016-08-15 NOTE — Patient Instructions (Signed)
Health Maintenance for Postmenopausal Women Menopause is a normal process in which your reproductive ability comes to an end. This process happens gradually over a span of months to years, usually between the ages of 22 and 9. Menopause is complete when you have missed 12 consecutive menstrual periods. It is important to talk with your health care provider about some of the most common conditions that affect postmenopausal women, such as heart disease, cancer, and bone loss (osteoporosis). Adopting a healthy lifestyle and getting preventive care can help to promote your health and wellness. Those actions can also lower your chances of developing some of these common conditions. What should I know about menopause? During menopause, you may experience a number of symptoms, such as:  Moderate-to-severe hot flashes.  Night sweats.  Decrease in sex drive.  Mood swings.  Headaches.  Tiredness.  Irritability.  Memory problems.  Insomnia.  Choosing to treat or not to treat menopausal changes is an individual decision that you make with your health care provider. What should I know about hormone replacement therapy and supplements? Hormone therapy products are effective for treating symptoms that are associated with menopause, such as hot flashes and night sweats. Hormone replacement carries certain risks, especially as you become older. If you are thinking about using estrogen or estrogen with progestin treatments, discuss the benefits and risks with your health care provider. What should I know about heart disease and stroke? Heart disease, heart attack, and stroke become more likely as you age. This may be due, in part, to the hormonal changes that your body experiences during menopause. These can affect how your body processes dietary fats, triglycerides, and cholesterol. Heart attack and stroke are both medical emergencies. There are many things that you can do to help prevent heart disease  and stroke:  Have your blood pressure checked at least every 1-2 years. High blood pressure causes heart disease and increases the risk of stroke.  If you are 53-22 years old, ask your health care provider if you should take aspirin to prevent a heart attack or a stroke.  Do not use any tobacco products, including cigarettes, chewing tobacco, or electronic cigarettes. If you need help quitting, ask your health care provider.  It is important to eat a healthy diet and maintain a healthy weight. ? Be sure to include plenty of vegetables, fruits, low-fat dairy products, and lean protein. ? Avoid eating foods that are high in solid fats, added sugars, or salt (sodium).  Get regular exercise. This is one of the most important things that you can do for your health. ? Try to exercise for at least 150 minutes each week. The type of exercise that you do should increase your heart rate and make you sweat. This is known as moderate-intensity exercise. ? Try to do strengthening exercises at least twice each week. Do these in addition to the moderate-intensity exercise.  Know your numbers.Ask your health care provider to check your cholesterol and your blood glucose. Continue to have your blood tested as directed by your health care provider.  What should I know about cancer screening? There are several types of cancer. Take the following steps to reduce your risk and to catch any cancer development as early as possible. Breast Cancer  Practice breast self-awareness. ? This means understanding how your breasts normally appear and feel. ? It also means doing regular breast self-exams. Let your health care provider know about any changes, no matter how small.  If you are 40  or older, have a clinician do a breast exam (clinical breast exam or CBE) every year. Depending on your age, family history, and medical history, it may be recommended that you also have a yearly breast X-ray (mammogram).  If you  have a family history of breast cancer, talk with your health care provider about genetic screening.  If you are at high risk for breast cancer, talk with your health care provider about having an MRI and a mammogram every year.  Breast cancer (BRCA) gene test is recommended for women who have family members with BRCA-related cancers. Results of the assessment will determine the need for genetic counseling and BRCA1 and for BRCA2 testing. BRCA-related cancers include these types: ? Breast. This occurs in males or females. ? Ovarian. ? Tubal. This may also be called fallopian tube cancer. ? Cancer of the abdominal or pelvic lining (peritoneal cancer). ? Prostate. ? Pancreatic.  Cervical, Uterine, and Ovarian Cancer Your health care provider may recommend that you be screened regularly for cancer of the pelvic organs. These include your ovaries, uterus, and vagina. This screening involves a pelvic exam, which includes checking for microscopic changes to the surface of your cervix (Pap test).  For women ages 21-65, health care providers may recommend a pelvic exam and a Pap test every three years. For women ages 79-65, they may recommend the Pap test and pelvic exam, combined with testing for human papilloma virus (HPV), every five years. Some types of HPV increase your risk of cervical cancer. Testing for HPV may also be done on women of any age who have unclear Pap test results.  Other health care providers may not recommend any screening for nonpregnant women who are considered low risk for pelvic cancer and have no symptoms. Ask your health care provider if a screening pelvic exam is right for you.  If you have had past treatment for cervical cancer or a condition that could lead to cancer, you need Pap tests and screening for cancer for at least 20 years after your treatment. If Pap tests have been discontinued for you, your risk factors (such as having a new sexual partner) need to be  reassessed to determine if you should start having screenings again. Some women have medical problems that increase the chance of getting cervical cancer. In these cases, your health care provider may recommend that you have screening and Pap tests more often.  If you have a family history of uterine cancer or ovarian cancer, talk with your health care provider about genetic screening.  If you have vaginal bleeding after reaching menopause, tell your health care provider.  There are currently no reliable tests available to screen for ovarian cancer.  Lung Cancer Lung cancer screening is recommended for adults 69-62 years old who are at high risk for lung cancer because of a history of smoking. A yearly low-dose CT scan of the lungs is recommended if you:  Currently smoke.  Have a history of at least 30 pack-years of smoking and you currently smoke or have quit within the past 15 years. A pack-year is smoking an average of one pack of cigarettes per day for one year.  Yearly screening should:  Continue until it has been 15 years since you quit.  Stop if you develop a health problem that would prevent you from having lung cancer treatment.  Colorectal Cancer  This type of cancer can be detected and can often be prevented.  Routine colorectal cancer screening usually begins at  age 42 and continues through age 45.  If you have risk factors for colon cancer, your health care provider may recommend that you be screened at an earlier age.  If you have a family history of colorectal cancer, talk with your health care provider about genetic screening.  Your health care provider may also recommend using home test kits to check for hidden blood in your stool.  A small camera at the end of a tube can be used to examine your colon directly (sigmoidoscopy or colonoscopy). This is done to check for the earliest forms of colorectal cancer.  Direct examination of the colon should be repeated every  5-10 years until age 71. However, if early forms of precancerous polyps or small growths are found or if you have a family history or genetic risk for colorectal cancer, you may need to be screened more often.  Skin Cancer  Check your skin from head to toe regularly.  Monitor any moles. Be sure to tell your health care provider: ? About any new moles or changes in moles, especially if there is a change in a mole's shape or color. ? If you have a mole that is larger than the size of a pencil eraser.  If any of your family members has a history of skin cancer, especially at a young age, talk with your health care provider about genetic screening.  Always use sunscreen. Apply sunscreen liberally and repeatedly throughout the day.  Whenever you are outside, protect yourself by wearing long sleeves, pants, a wide-brimmed hat, and sunglasses.  What should I know about osteoporosis? Osteoporosis is a condition in which bone destruction happens more quickly than new bone creation. After menopause, you may be at an increased risk for osteoporosis. To help prevent osteoporosis or the bone fractures that can happen because of osteoporosis, the following is recommended:  If you are 46-71 years old, get at least 1,000 mg of calcium and at least 600 mg of vitamin D per day.  If you are older than age 55 but younger than age 65, get at least 1,200 mg of calcium and at least 600 mg of vitamin D per day.  If you are older than age 54, get at least 1,200 mg of calcium and at least 800 mg of vitamin D per day.  Smoking and excessive alcohol intake increase the risk of osteoporosis. Eat foods that are rich in calcium and vitamin D, and do weight-bearing exercises several times each week as directed by your health care provider. What should I know about how menopause affects my mental health? Depression may occur at any age, but it is more common as you become older. Common symptoms of depression  include:  Low or sad mood.  Changes in sleep patterns.  Changes in appetite or eating patterns.  Feeling an overall lack of motivation or enjoyment of activities that you previously enjoyed.  Frequent crying spells.  Talk with your health care provider if you think that you are experiencing depression. What should I know about immunizations? It is important that you get and maintain your immunizations. These include:  Tetanus, diphtheria, and pertussis (Tdap) booster vaccine.  Influenza every year before the flu season begins.  Pneumonia vaccine.  Shingles vaccine.  Your health care provider may also recommend other immunizations. This information is not intended to replace advice given to you by your health care provider. Make sure you discuss any questions you have with your health care provider. Document Released: 04/14/2005  Document Revised: 09/10/2015 Document Reviewed: 11/24/2014 Elsevier Interactive Patient Education  2018 [employer]  

## 2016-08-24 DIAGNOSIS — N3001 Acute cystitis with hematuria: Secondary | ICD-10-CM | POA: Diagnosis not present

## 2016-08-24 DIAGNOSIS — N3090 Cystitis, unspecified without hematuria: Secondary | ICD-10-CM | POA: Diagnosis not present

## 2016-08-28 ENCOUNTER — Other Ambulatory Visit (INDEPENDENT_AMBULATORY_CARE_PROVIDER_SITE_OTHER): Payer: Medicare Other

## 2016-08-28 DIAGNOSIS — E890 Postprocedural hypothyroidism: Secondary | ICD-10-CM

## 2016-08-28 DIAGNOSIS — Z0000 Encounter for general adult medical examination without abnormal findings: Secondary | ICD-10-CM | POA: Diagnosis not present

## 2016-08-28 LAB — URINALYSIS
Bilirubin Urine: NEGATIVE
HGB URINE DIPSTICK: NEGATIVE
KETONES UR: NEGATIVE
LEUKOCYTES UA: NEGATIVE
Nitrite: NEGATIVE
Specific Gravity, Urine: 1.01 (ref 1.000–1.030)
Total Protein, Urine: NEGATIVE
URINE GLUCOSE: NEGATIVE
UROBILINOGEN UA: 0.2 (ref 0.0–1.0)
pH: 7 (ref 5.0–8.0)

## 2016-08-28 LAB — CBC WITH DIFFERENTIAL/PLATELET
BASOS PCT: 0.8 % (ref 0.0–3.0)
Basophils Absolute: 0.1 10*3/uL (ref 0.0–0.1)
EOS PCT: 1.7 % (ref 0.0–5.0)
Eosinophils Absolute: 0.1 10*3/uL (ref 0.0–0.7)
HCT: 41.7 % (ref 36.0–46.0)
HEMOGLOBIN: 14.7 g/dL (ref 12.0–15.0)
LYMPHS ABS: 1.4 10*3/uL (ref 0.7–4.0)
Lymphocytes Relative: 17.6 % (ref 12.0–46.0)
MCHC: 35.3 g/dL (ref 30.0–36.0)
MCV: 86.1 fl (ref 78.0–100.0)
Monocytes Absolute: 0.6 10*3/uL (ref 0.1–1.0)
Monocytes Relative: 7.6 % (ref 3.0–12.0)
Neutro Abs: 5.7 10*3/uL (ref 1.4–7.7)
Neutrophils Relative %: 72.3 % (ref 43.0–77.0)
Platelets: 225 10*3/uL (ref 150.0–400.0)
RBC: 4.84 Mil/uL (ref 3.87–5.11)
RDW: 15.1 % (ref 11.5–15.5)
WBC: 7.9 10*3/uL (ref 4.0–10.5)

## 2016-08-28 LAB — BASIC METABOLIC PANEL
BUN: 11 mg/dL (ref 6–23)
CHLORIDE: 104 meq/L (ref 96–112)
CO2: 26 meq/L (ref 19–32)
Calcium: 8.8 mg/dL (ref 8.4–10.5)
Creatinine, Ser: 0.85 mg/dL (ref 0.40–1.20)
GFR: 69.16 mL/min (ref >60.00)
GLUCOSE: 101 mg/dL — AB (ref 70–99)
POTASSIUM: 3.9 meq/L (ref 3.5–5.1)
Sodium: 139 mEq/L (ref 135–145)

## 2016-08-28 LAB — HEPATIC FUNCTION PANEL
ALT: 24 U/L (ref 0–35)
AST: 21 U/L (ref 0–37)
Albumin: 4.3 g/dL (ref 3.5–5.2)
Alkaline Phosphatase: 38 U/L — ABNORMAL LOW (ref 39–117)
BILIRUBIN TOTAL: 0.9 mg/dL (ref 0.2–1.2)
Bilirubin, Direct: 0.1 mg/dL (ref 0.0–0.3)
TOTAL PROTEIN: 6.7 g/dL (ref 6.0–8.3)

## 2016-08-28 LAB — LIPID PANEL
CHOLESTEROL: 187 mg/dL (ref 0–200)
HDL: 54.5 mg/dL (ref >39.00)
LDL CALC: 114 mg/dL — AB (ref 0–99)
NonHDL: 132.39
TRIGLYCERIDES: 94 mg/dL (ref 0.0–149.0)
Total CHOL/HDL Ratio: 3
VLDL: 18.8 mg/dL (ref 0.0–40.0)

## 2016-08-28 LAB — T4, FREE: FREE T4: 0.93 ng/dL (ref 0.60–1.60)

## 2016-08-28 LAB — TSH: TSH: 0.72 u[IU]/mL (ref 0.35–4.50)

## 2016-08-30 ENCOUNTER — Other Ambulatory Visit: Payer: Self-pay | Admitting: Internal Medicine

## 2016-10-04 ENCOUNTER — Encounter: Payer: Self-pay | Admitting: Internal Medicine

## 2016-10-04 ENCOUNTER — Ambulatory Visit (AMBULATORY_SURGERY_CENTER): Payer: Self-pay | Admitting: *Deleted

## 2016-10-04 VITALS — Ht 66.0 in | Wt 152.0 lb

## 2016-10-04 DIAGNOSIS — Z86010 Personal history of colonic polyps: Secondary | ICD-10-CM

## 2016-10-04 NOTE — Progress Notes (Signed)
No allergies to eggs or soy. No problems with anesthesia.  Pt given Emmi instructions for colonoscopy  No oxygen use  No diet drug use  

## 2016-10-12 ENCOUNTER — Other Ambulatory Visit: Payer: Self-pay | Admitting: Internal Medicine

## 2016-10-17 ENCOUNTER — Ambulatory Visit: Payer: Medicare Other | Admitting: Internal Medicine

## 2016-10-18 ENCOUNTER — Encounter: Payer: Self-pay | Admitting: Internal Medicine

## 2016-10-18 ENCOUNTER — Ambulatory Visit (AMBULATORY_SURGERY_CENTER): Payer: Medicare Other | Admitting: Internal Medicine

## 2016-10-18 VITALS — BP 133/75 | HR 69 | Temp 98.2°F | Resp 11 | Ht 66.0 in | Wt 149.0 lb

## 2016-10-18 DIAGNOSIS — Z86010 Personal history of colonic polyps: Secondary | ICD-10-CM

## 2016-10-18 DIAGNOSIS — D123 Benign neoplasm of transverse colon: Secondary | ICD-10-CM | POA: Diagnosis not present

## 2016-10-18 DIAGNOSIS — D120 Benign neoplasm of cecum: Secondary | ICD-10-CM | POA: Diagnosis not present

## 2016-10-18 MED ORDER — SODIUM CHLORIDE 0.9 % IV SOLN: 500.0000 mL | INTRAVENOUS | Status: DC

## 2016-10-18 NOTE — Progress Notes (Signed)
Called to room to assist during endoscopic procedure.  Patient ID and intended procedure confirmed with present staff. Received instructions for my participation in the procedure from the performing physician.  

## 2016-10-18 NOTE — Patient Instructions (Addendum)
I removed 2 very small polyps that look benign. You also have a condition called diverticulosis - common and not usually a problem. Please read the handout provided.   I doubt you will need another routine repeat colonoscopy - I will let you know with a letter.  I appreciate the opportunity to care for you. Gatha Mayer, MD, FACG YOU HAD AN ENDOSCOPIC PROCEDURE TODAY AT [facility]:   Refer to the procedure report that was given to you for any specific questions about what was found during the examination.  If the procedure report does not answer your questions, please call your gastroenterologist to clarify.  If you requested that your care partner not be given the details of your procedure findings, then the procedure report has been included in a sealed envelope for you to review at your convenience later.  YOU SHOULD EXPECT: Some feelings of bloating in the abdomen. Passage of more gas than usual.  Walking can help get rid of the air that was put into your GI tract during the procedure and reduce the bloating. If you had a lower endoscopy (such as a colonoscopy or flexible sigmoidoscopy) you may notice spotting of blood in your stool or on the toilet paper. If you underwent a bowel prep for your procedure, you may not have a normal bowel movement for a few days.  Please Note:  You might notice some irritation and congestion in your nose or some drainage.  This is from the oxygen used during your procedure.  There is no need for concern and it should clear up in a day or so.  SYMPTOMS TO REPORT IMMEDIATELY:   Following lower endoscopy (colonoscopy or flexible sigmoidoscopy):  Excessive amounts of blood in the stool  Significant tenderness or worsening of abdominal pains  Swelling of the abdomen that is new, acute  Fever of 100F or higher   Following upper endoscopy (EGD)  Vomiting of blood or coffee ground material  New chest pain or pain under the shoulder  blades  Painful or persistently difficult swallowing  New shortness of breath  Fever of 100F or higher  Black, tarry-looking stools  For urgent or emergent issues, a gastroenterologist can be reached at any hour by calling [phone].   DIET:  We do recommend a small meal at first, but then you may proceed to your regular diet.  Drink plenty of fluids but you should avoid alcoholic beverages for 24 hours.  ACTIVITY:  You should plan to take it easy for the rest of today and you should NOT DRIVE or use heavy machinery until tomorrow (because of the sedation medicines used during the test).    FOLLOW UP: Our staff will call the number listed on your records the next business day following your procedure to check on you and address any questions or concerns that you may have regarding the information given to you following your procedure. If we do not reach you, we will leave a message.  However, if you are feeling well and you are not experiencing any problems, there is no need to return our call.  We will assume that you have returned to your regular daily activities without incident.  If any biopsies were taken you will be contacted by phone or by letter within the next 1-3 weeks.  Please call us at [phone] if you have not heard about the biopsies in 3 weeks.    SIGNATURES/CONFIDENTIALITY: You and/or your care partner  have signed paperwork which will be entered into your electronic medical record.  These signatures attest to the fact that that the information above on your After Visit Summary has been reviewed and is understood.  Full responsibility of the confidentiality of this discharge information lies with you and/or your care-partner.  Polyp and diverticulosis information given.

## 2016-10-18 NOTE — Op Note (Signed)
[place] Patient Name: Lauren Mcgee Procedure Date: 10/18/2016 11:00 AM MRN: [id number] Endoscopist: Gatha Mayer , MD Age: 76 Referring MD:  Date of Birth: 1940/10/08 Gender: Female Account #: [id number] Procedure:                Colonoscopy Indications:              Surveillance: Personal history of adenomatous                            polyps on last colonoscopy > 5 years ago Medicines:                Propofol per Anesthesia, Monitored Anesthesia Care Procedure:                Pre-Anesthesia Assessment:                           - Prior to the procedure, a History and Physical                            was performed, and patient medications and                            allergies were reviewed. The patient's tolerance of                            previous anesthesia was also reviewed. The risks                            and benefits of the procedure and the sedation                            options and risks were discussed with the patient.                            All questions were answered, and informed consent                            was obtained. Prior Anticoagulants: The patient has                            taken no previous anticoagulant or antiplatelet                            agents. ASA Grade Assessment: II - A patient with                            mild systemic disease. After reviewing the risks                            and benefits, the patient was deemed in                            satisfactory condition to undergo the procedure.  After obtaining informed consent, the colonoscope                            was passed under direct vision. Throughout the                            procedure, the patient's blood pressure, pulse, and                            oxygen saturations were monitored continuously. The                            Colonoscope was introduced through the anus and   advanced to the the cecum, identified by                            appendiceal orifice and ileocecal valve. The                            colonoscopy was performed without difficulty. The                            patient tolerated the procedure well. The quality                            of the bowel preparation was good. The bowel                            preparation used was Miralax. The ileocecal valve,                            appendiceal orifice, and rectum were photographed. Scope In: 11:03:46 AM Scope Out: 11:19:45 AM Scope Withdrawal Time: 0 hours 10 minutes 22 seconds  Total Procedure Duration: 0 hours 15 minutes 59 seconds  Findings:                 The perianal and digital rectal examinations were                            normal.                           Two sessile polyps were found in the transverse                            colon and cecum. The polyps were diminutive in                            size. These polyps were removed with a cold snare.                            Resection and retrieval were complete. Verification                            of patient identification  for the specimen was                            done. Estimated blood loss was minimal.                           Multiple diverticula were found in the sigmoid                            colon.                           The exam was otherwise without abnormality on                            direct and retroflexion views. Complications:            No immediate complications. Estimated Blood Loss:     Estimated blood loss was minimal. Impression:               - Two diminutive polyps in the transverse colon and                            in the cecum, removed with a cold snare. Resected                            and retrieved.                           - Diverticulosis in the sigmoid colon.                           - The examination was otherwise normal on direct                             and retroflexion views. Recommendation:           - Patient has a contact number available for                            emergencies. The signs and symptoms of potential                            delayed complications were discussed with the                            patient. Return to normal activities tomorrow.                            Written discharge instructions were provided to the                            patient.                           - Resume previous diet.                           -  Continue present medications.                           - No repeat colonoscopy likely due to age - await                            pathology review Gatha Mayer, MD 10/18/2016 11:25:18 AM This report has been signed electronically.

## 2016-10-18 NOTE — Progress Notes (Signed)
Report to PACU, RN, vss, BBS= Clear.  

## 2016-10-19 ENCOUNTER — Telehealth: Payer: Self-pay

## 2016-10-19 NOTE — Telephone Encounter (Signed)
  Follow up Call-  Call back number 10/18/2016  Post procedure Call Back phone  # [phone]  Permission to leave phone message Yes  Some recent data might be hidden     Patient questions:  Do you have a fever, pain , or abdominal swelling? No. Pain Score  0 *  Have you tolerated food without any problems? Yes.    Have you been able to return to your normal activities? Yes.    Do you have any questions about your discharge instructions: Diet   No. Medications  No. Follow up visit  No.  Do you have questions or concerns about your Care? No.  Actions: * If pain score is 4 or above: No action needed, pain <4.  No problems noted per pt. maw

## 2016-10-24 ENCOUNTER — Encounter: Payer: Self-pay | Admitting: Internal Medicine

## 2016-10-24 DIAGNOSIS — Z86010 Personal history of colonic polyps: Secondary | ICD-10-CM

## 2016-10-24 NOTE — Progress Notes (Signed)
2 diminutive adenomas No routine repeat colon due to findings + age

## 2016-11-13 ENCOUNTER — Encounter: Payer: Self-pay | Admitting: Internal Medicine

## 2016-12-06 ENCOUNTER — Encounter: Payer: Self-pay | Admitting: Internal Medicine

## 2016-12-06 ENCOUNTER — Ambulatory Visit (INDEPENDENT_AMBULATORY_CARE_PROVIDER_SITE_OTHER): Payer: Medicare Other | Admitting: Internal Medicine

## 2016-12-06 VITALS — BP 120/82 | HR 79 | Temp 97.9°F | Wt 154.0 lb

## 2016-12-06 DIAGNOSIS — E890 Postprocedural hypothyroidism: Secondary | ICD-10-CM | POA: Diagnosis not present

## 2016-12-06 DIAGNOSIS — Z23 Encounter for immunization: Secondary | ICD-10-CM | POA: Diagnosis not present

## 2016-12-06 DIAGNOSIS — Z85850 Personal history of malignant neoplasm of thyroid: Secondary | ICD-10-CM | POA: Diagnosis not present

## 2016-12-06 LAB — T4, FREE: FREE T4: 1.02 ng/dL (ref 0.60–1.60)

## 2016-12-06 LAB — TSH: TSH: 0.88 u[IU]/mL (ref 0.35–4.50)

## 2016-12-06 NOTE — Progress Notes (Signed)
Patient ID: Lauren Mcgee, female   DOB: 1941-01-05, 76 y.o.   MRN: [id number]   HPI  Lauren Mcgee is a 76 y.o.-year-old female, returning for f/u for Thyroid Cancer in remission, and postsurgical hypothyroidism. Last visit 1 year and 2 mo ago.  She has been having night sweats in last 2-3 months.  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 No nodes sampled. One of the papillary tumors was approaching and may have involved the surgical resection margin. There was minimal extra thyroid though invasion of one of the papillary microcarcinoma's, but no lymphovascular invasion. - RAI tx with 154 mCi I-131 on 03/16/2010. At that time TSH was>100, thyroglobulin was 0.7, ATA<20 - Post treatment WBS did not show metastatic disease. There were foci of activity which were previously seen on the pretherapy scan. No new lesions identified. - she was followed by her surgeon q6 mo.   - Neck U/S 12/09/2012: Post thyroidectomy. No evidence of residual thyroid tissue or mass/nodule at the thyroid bed.  Component     Latest Ref Rng & Units 10/12/2015  Thyroglobulin     2.8 - 40.9 ng/mL <0.1 (L)  Thyroglobulin Ab     <2 IU/mL <1   Component     Latest Ref Rng & Units 12/02/2012 06/24/2013 10/12/2014  Thyroglobulin Ab     <2 IU/mL <20.0 <20.0 <1  Thyroglobulin     2.8 - 40.9 ng/mL <0.2 <0.2 <0.1 (L)    Postsurgical hypothyroidism: She is on Synthroid 100 >> 88 >> 75 mcg - brand name.   However, she is back on LT4 88 mcg daily, taken: - in am - fasting - at least 30 min from b'fast - she was on Ca  - no Fe, MVI - + PPIs (only occas.) - not on Biotin  I reviewed pt's thyroid tests: Lab Results  Component Value Date   TSH 0.72 08/28/2016   TSH 0.78 10/12/2015   TSH 0.16 (L)  05/24/2015   TSH 0.18 (L) 05/05/2015   TSH 0.06 (L) 10/12/2014   TSH 0.05 (L) 10/07/2013   TSH 0.02 (L) 06/24/2013   TSH 0.03 (L) 03/14/2013   TSH 0.21 (L) 12/02/2012   TSH 0.03 (L) 10/10/2012   FREET4 0.93 08/28/2016   FREET4 0.89 10/12/2015   FREET4 1.18 05/24/2015   FREET4 1.07 10/12/2014   FREET4 1.22 06/24/2013   FREET4 1.40 03/14/2013   FREET4 1.15 12/02/2012   FREET4 1.60 10/10/2012   Pt denies: - feeling nodules in neck - hoarseness - choking - SOB with lying down  She has had dysphagia even before thyroidectomy >> continues  She is on Atenolol 25 mg (per cardiology).  She also has a history of HTN,HL (Crestor >> fatigue), PVD, GERD.   ROS: Constitutional: no weight gain/no weight loss, no fatigue, + subjective hyperthermia, no subjective hypothermia Eyes: no blurry vision, no xerophthalmia ENT: no sore throat, + see HPI Cardiovascular: no CP/no SOB/no palpitations/no leg swelling Respiratory: no cough/no SOB/no wheezing Gastrointestinal: no N/no V/no D/no C/no acid reflux Musculoskeletal: no muscle aches/no joint aches Skin: no rashes, no hair loss Neurological: no tremors/no numbness/no tingling/no dizziness  I reviewed pt's medications, allergies, PMH, social hx, family hx, and changes were documented in the history of present illness. Otherwise, unchanged from my initial visit note.  PE: BP 120/82   Pulse 79   Temp 97.9 F (36.6 C) (Oral)   Wt 154 lb (69.9 kg)   SpO2 98%   BMI 24.86 kg/m  Body mass index is 24.86 kg/m. Wt Readings from Last 3 Encounters:  12/06/16 154 lb (69.9 kg)  10/18/16 149 lb (67.6 kg)  10/04/16 152 lb (68.9 kg)   Constitutional: normal weight, in NAD Eyes: PERRLA, EOMI, no exophthalmos ENT: moist mucous membranes, no thyromegaly, cervical scar healed, no cervical lymphadenopathy Cardiovascular: RRR, No MRG Respiratory: CTA B Gastrointestinal: abdomen soft, NT, ND, BS+ Musculoskeletal: no deformities, strength intact  in all 4 Skin: moist, warm, no rashes Neurological: no tremor with outstretched hands, DTR normal in all 4  ASSESSMENT: 1. Hypothyroidism  2. History of thyroid cancer  PLAN:  1. Patient with long-standing hypothyroidism, on levothyroxine therapy.  - latest thyroid labs reviewed with pt >> normal  - she continues on LT4 DAW 88 mcg daily (she is not sure when she increased the dose from the recommended 75 mcg... - we discussed about taking the thyroid hormone every day, with water, >30 minutes before breakfast, separated by >4 hours from acid reflux medications, calcium, iron, multivitamins. Pt. is taking it correctly - labs reviewed from 08/2016 >> normal - but will recheck as she c/o night sweats. No tremors, palpitations, wt loss - RTC in 1 year  2. PTC - in remission - reviewed records received from Dr Celine Ahr - reviewed latest Tg + ATA >> both undetectable - will check TG + ATA today >> discussed that we will need a neck U/S if increasing  Orders Placed This Encounter  Procedures  . TSH  . T4, free  . Thyroglobulin Level  . Thyroglobulin antibody   Component     Latest Ref Rng & Units 12/06/2016  Thyroglobulin     ng/mL 0.1 (L)  Comment        TSH     0.35 - 4.50 uIU/mL 0.88  T4,Free(Direct)     0.60 - 1.60 ng/dL 1.02  Thyroglobulin Ab     < or = 1 IU/mL <1   TFTs normal, but as she has night sweats >> we can try to change to 76 mcg daily and recheck in 2 months. I will repeat the Tg at that time, as it was detectable (although very low).  Philemon Kingdom, MD PhD [facility] End[facility]

## 2016-12-06 NOTE — Patient Instructions (Signed)
Please stop at the lab.  Please continue Synthroid 88 mcg daily.  Take the thyroid hormone every day, with water, at least 30 minutes before breakfast, separated by at least 4 hours from: - acid reflux medications - calcium - iron - multivitamins  Please come back for a follow-up appointment in 1 year. 

## 2016-12-07 ENCOUNTER — Telehealth: Payer: Self-pay

## 2016-12-07 DIAGNOSIS — I10 Essential (primary) hypertension: Secondary | ICD-10-CM | POA: Diagnosis not present

## 2016-12-07 DIAGNOSIS — E039 Hypothyroidism, unspecified: Secondary | ICD-10-CM | POA: Diagnosis not present

## 2016-12-07 LAB — THYROGLOBULIN LEVEL: THYROGLOBULIN: 0.1 ng/mL — AB

## 2016-12-07 LAB — THYROGLOBULIN ANTIBODY: Thyroglobulin Ab: <1 IU/mL (ref <=1)

## 2016-12-07 NOTE — Telephone Encounter (Signed)
LVM, gave lab results. Gave call back number to schedule lab appointment and to advise if she is okay to switch medication dosages?

## 2016-12-07 NOTE — Telephone Encounter (Signed)
-----   Message from Philemon Kingdom, MD sent at 12/07/2016 12:56 PM EDT ----- Almyra Free, can you please call pt:  TFTs normal, but as she has night sweats >> we can try to change to 75 mcg daily (please check with her if she likes to try this)  and recheck in 2 months (labs are in). I will repeat the Tg at that time, as it was detectable (although very low). Lab is in.

## 2017-02-13 ENCOUNTER — Ambulatory Visit: Payer: Medicare Other | Admitting: Internal Medicine

## 2017-04-30 DIAGNOSIS — L821 Other seborrheic keratosis: Secondary | ICD-10-CM | POA: Diagnosis not present

## 2017-04-30 DIAGNOSIS — L918 Other hypertrophic disorders of the skin: Secondary | ICD-10-CM | POA: Diagnosis not present

## 2017-05-07 DIAGNOSIS — I10 Essential (primary) hypertension: Secondary | ICD-10-CM | POA: Diagnosis not present

## 2017-06-13 DIAGNOSIS — H47233 Glaucomatous optic atrophy, bilateral: Secondary | ICD-10-CM | POA: Diagnosis not present

## 2017-06-13 DIAGNOSIS — H2513 Age-related nuclear cataract, bilateral: Secondary | ICD-10-CM | POA: Diagnosis not present

## 2017-06-13 DIAGNOSIS — H5202 Hypermetropia, left eye: Secondary | ICD-10-CM | POA: Diagnosis not present

## 2017-06-13 DIAGNOSIS — H02401 Unspecified ptosis of right eyelid: Secondary | ICD-10-CM | POA: Diagnosis not present

## 2017-08-28 DIAGNOSIS — H25013 Cortical age-related cataract, bilateral: Secondary | ICD-10-CM | POA: Diagnosis not present

## 2017-08-28 DIAGNOSIS — H25043 Posterior subcapsular polar age-related cataract, bilateral: Secondary | ICD-10-CM | POA: Diagnosis not present

## 2017-08-28 DIAGNOSIS — H18413 Arcus senilis, bilateral: Secondary | ICD-10-CM | POA: Diagnosis not present

## 2017-08-28 DIAGNOSIS — H2512 Age-related nuclear cataract, left eye: Secondary | ICD-10-CM | POA: Diagnosis not present

## 2017-08-28 DIAGNOSIS — H2513 Age-related nuclear cataract, bilateral: Secondary | ICD-10-CM | POA: Diagnosis not present

## 2017-10-02 DIAGNOSIS — Z1389 Encounter for screening for other disorder: Secondary | ICD-10-CM | POA: Diagnosis not present

## 2017-10-02 DIAGNOSIS — E7800 Pure hypercholesterolemia, unspecified: Secondary | ICD-10-CM | POA: Diagnosis not present

## 2017-10-02 DIAGNOSIS — E039 Hypothyroidism, unspecified: Secondary | ICD-10-CM | POA: Diagnosis not present

## 2017-10-02 DIAGNOSIS — Z0000 Encounter for general adult medical examination without abnormal findings: Secondary | ICD-10-CM | POA: Diagnosis not present

## 2017-10-02 DIAGNOSIS — I10 Essential (primary) hypertension: Secondary | ICD-10-CM | POA: Diagnosis not present

## 2017-10-02 DIAGNOSIS — N959 Unspecified menopausal and perimenopausal disorder: Secondary | ICD-10-CM | POA: Diagnosis not present

## 2017-10-19 DIAGNOSIS — H2511 Age-related nuclear cataract, right eye: Secondary | ICD-10-CM | POA: Diagnosis not present

## 2017-10-19 DIAGNOSIS — H2512 Age-related nuclear cataract, left eye: Secondary | ICD-10-CM | POA: Diagnosis not present

## 2017-10-25 DIAGNOSIS — D1801 Hemangioma of skin and subcutaneous tissue: Secondary | ICD-10-CM | POA: Diagnosis not present

## 2017-10-25 DIAGNOSIS — D2239 Melanocytic nevi of other parts of face: Secondary | ICD-10-CM | POA: Diagnosis not present

## 2017-10-25 DIAGNOSIS — L720 Epidermal cyst: Secondary | ICD-10-CM | POA: Diagnosis not present

## 2017-10-25 DIAGNOSIS — L723 Sebaceous cyst: Secondary | ICD-10-CM | POA: Diagnosis not present

## 2017-10-25 DIAGNOSIS — L821 Other seborrheic keratosis: Secondary | ICD-10-CM | POA: Diagnosis not present

## 2017-11-06 ENCOUNTER — Other Ambulatory Visit: Payer: Self-pay | Admitting: Internal Medicine

## 2017-11-09 DIAGNOSIS — H2511 Age-related nuclear cataract, right eye: Secondary | ICD-10-CM | POA: Diagnosis not present

## 2017-12-03 ENCOUNTER — Other Ambulatory Visit: Payer: Self-pay | Admitting: Internal Medicine

## 2017-12-06 ENCOUNTER — Encounter: Payer: Self-pay | Admitting: Internal Medicine

## 2017-12-06 ENCOUNTER — Ambulatory Visit (INDEPENDENT_AMBULATORY_CARE_PROVIDER_SITE_OTHER): Payer: Medicare Other | Admitting: Internal Medicine

## 2017-12-06 VITALS — BP 120/72 | HR 60 | Ht 65.25 in | Wt 160.0 lb

## 2017-12-06 DIAGNOSIS — Z85850 Personal history of malignant neoplasm of thyroid: Secondary | ICD-10-CM

## 2017-12-06 DIAGNOSIS — E890 Postprocedural hypothyroidism: Secondary | ICD-10-CM

## 2017-12-06 DIAGNOSIS — Z23 Encounter for immunization: Secondary | ICD-10-CM

## 2017-12-06 MED ORDER — SYNTHROID 88 MCG PO TABS: 88.0000 ug | ORAL_TABLET | Freq: Every day | ORAL | 3 refills | Status: DC

## 2017-12-06 NOTE — Patient Instructions (Signed)
Please restart Synthroid 88 mcg daily.  Take the thyroid hormone every day, with water, at least 30 minutes before breakfast, separated by at least 4 hours from: - acid reflux medications - calcium - iron - multivitamins  Please come back in 1.5 months for labs.  Please come back for a follow-up appointment in 1 year.

## 2017-12-06 NOTE — Progress Notes (Signed)
Patient ID: Lauren Mcgee, female   DOB: 05/26/1940, 77 y.o.   MRN: [id number]   HPI  Lauren Mcgee is a 77 y.o.-year-old female, returning for f/u for Thyroid Cancer in remission, and postsurgical hypothyroidism. Last visit 1 year ago.  At last visit, I advised her to decrease the dose of her levothyroxine she was having night sweats. She forgot and did not change it.  She tells me she is off LT4 for 2 weeks as she did not want to refill this until coming here!!!  Reviewed and addended hx: - dx with thyroid cancer in 2010.  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 No nodes sampled. One of the papillary tumors was approaching and may have involved the surgical resection margin. There was minimal extra thyroid though invasion of one of the papillary microcarcinoma's, but no lymphovascular invasion.  - RAI tx with 154 mCi I-131 on 03/16/2010. At that time TSH was>100, thyroglobulin was 0.7, ATA<20  - Post treatment WBS did not show metastatic disease. There were foci of activity which were previously seen on the pretherapy scan. No new lesions identified.  Neck U/S 12/09/2012: Post thyroidectomy. No evidence of residual thyroid tissue or mass/nodule at the thyroid bed.  Reviewed her thyroglobulin and thyroglobulin antibody levels >> at last visit, thyroglobulin was very low, but detectable.  I advised her to come back in 6 months for repeat, but she did not do so. Lab Results  Component Value Date   THYROGLB 0.1 (L) 12/06/2016   THYROGLB <0.1 (L) 10/12/2015   THYROGLB <0.1 (L) 10/12/2014   THYROGLB <0.2 06/24/2013   THYROGLB <0.2 12/02/2012   THGAB <1 12/06/2016   THGAB <1 10/12/2015   THGAB <1 10/12/2014   THGAB <20.0 06/24/2013   THGAB <20.0 12/02/2012   Postsurgical hypothyroidism: She is on Synthroid 100 >>  88 mcg  She continues on Synthroid 88 mcg: - in am - fasting - at least 30 min from b'fast - no Ca, Fe, MVI, no PPI - not on Biotin  Reviewed patient's TFTs: Lab Results  Component Value Date   TSH 0.88 12/06/2016   TSH 0.72 08/28/2016   TSH 0.78 10/12/2015   TSH 0.16 (L) 05/24/2015   TSH 0.18 (L) 05/05/2015   TSH 0.06 (L) 10/12/2014   TSH 0.05 (L) 10/07/2013   TSH 0.02 (L) 06/24/2013   TSH 0.03 (L) 03/14/2013   TSH 0.21 (L) 12/02/2012   FREET4 1.02 12/06/2016   FREET4 0.93 08/28/2016   FREET4 0.89 10/12/2015   FREET4 1.18 05/24/2015   FREET4 1.07 10/12/2014   FREET4 1.22 06/24/2013   FREET4 1.40 03/14/2013   FREET4 1.15 12/02/2012   FREET4 1.60 10/10/2012   Pt denies: - feeling nodules in neck - hoarseness - choking - SOB with lying down  She has had dysphagia even before thyroidectomy and this continues.  She is on atenolol 25 mg daily-per cardiology.  She also has a history of HTN, HL (Crestor >> fatigue), PVD, GERD.   ROS: Constitutional: no weight gain/no weight loss, no fatigue, no subjective hyperthermia, no subjective hypothermia Eyes: no blurry vision, no xerophthalmia ENT: no sore throat, + see HPI Cardiovascular: no CP/no SOB/no palpitations/no leg swelling Respiratory: no cough/no SOB/no wheezing Gastrointestinal: no N/no V/no D/no C/no acid reflux Musculoskeletal: no muscle aches/no joint aches Skin: no rashes, no hair loss Neurological:  no tremors/no numbness/no tingling/no dizziness  I reviewed pt's medications, allergies, PMH, social hx, family hx, and changes were documented in the history of present illness. Otherwise, unchanged from my initial visit note.  Past Medical History:  Diagnosis Date  . Diverticulosis of colon (without mention of hemorrhage)   . GERD (gastroesophageal reflux disease)   . Hx of colonic polyp 2001   TUBULAR ADENOMA  . Hyperlipidemia   . Hypertension   . Hypothyroidism   . IBS (irritable bowel syndrome)   .  PVD (peripheral vascular disease) ([place])   . Thyroid cancer ([place]) 2011   Past Surgical History:  Procedure Laterality Date  . ABDOMINAL HYSTERECTOMY  1991  . COLONOSCOPY  2010   562.10  . THYROIDECTOMY  2010   and 131I treatment at [facility]- Dr. Celine Ahr   Social History   Socioeconomic History  . Marital status: Married    Spouse name: Not on file  . Number of children: 2  . Years of education: Not on file  . Highest education level: Not on file  Occupational History  . Occupation: retired  Scientific laboratory technician  . Financial resource strain: Not on file  . Food insecurity:    Worry: Not on file    Inability: Not on file  . Transportation needs:    Medical: Not on file    Non-medical: Not on file  Tobacco Use  . Smoking status: Former Smoker    Packs/day: 0.50    Types: Cigarettes    Last attempt to quit: 03/06/1958    Years since quitting: 59.7  . Smokeless tobacco: Never Used  Substance and Sexual Activity  . Alcohol use: No  . Drug use: No  . Sexual activity: Yes  Lifestyle  . Physical activity:    Days per week: Not on file    Minutes per session: Not on file  . Stress: Not on file  Relationships  . Social connections:    Talks on phone: Not on file    Gets together: Not on file    Attends religious service: Not on file    Active member of club or organization: Not on file    Attends meetings of clubs or organizations: Not on file    Relationship status: Not on file  . Intimate partner violence:    Fear of current or ex partner: Not on file    Emotionally abused: Not on file    Physically abused: Not on file    Forced sexual activity: Not on file  Other Topics Concern  . Not on file  Social History Narrative   Regular exercise: not outside the home    Caffeine use: coffee daily   Current Outpatient Medications on File Prior to Visit  Medication Sig Dispense Refill  . aspirin 81 MG chewable tablet Chew 81 mg by mouth daily.      Marland Kitchen atenolol (TENORMIN) 25 MG  tablet TAKE 1 TABLET BY MOUTH EVERY DAY 90 tablet 3  . b complex vitamins capsule Take 1 capsule by mouth daily.    . cholecalciferol (VITAMIN D) 1000 UNITS tablet Take 1,000 Units by mouth daily.      Marland Kitchen Cod Liver Oil 1000 MG CAPS Take 2 capsules by mouth daily.    . DENTA 5000 PLUS 1.1 % CREA dental cream     . escitalopram (LEXAPRO) 10 MG tablet Take 1 tablet (10 mg total) by mouth daily. 30 tablet 11  . MAGNESIUM PO Take 250 mg by mouth daily.    Marland Kitchen  Omega-3 Fatty Acids (FISH OIL) 1000 MG CAPS Take 1-2 capsules by mouth daily.    Marland Kitchen saccharomyces boulardii (FLORASTOR) 250 MG capsule Take 1 capsule (250 mg total) by mouth daily as needed.    Marland Kitchen SYNTHROID 88 MCG tablet TAKE 1 TABLET (88 MCG TOTAL) BY MOUTH DAILY BEFORE BREAKFAST. 90 tablet 3   Current Facility-Administered Medications on File Prior to Visit  Medication Dose Route Frequency Provider Last Rate Last Dose  . 0.9 %  sodium chloride infusion  500 mL Intravenous Continuous Gatha Mayer, MD       Allergies  Allergen Reactions  . Influenza Vaccines     Local reaction   Family History  Problem Relation Age of Onset  . Hyperlipidemia Mother   . Hypertension Other   . Colon cancer Paternal Aunt 78  . Colon cancer Son 64  . Colon polyps Neg Hx   . Diabetes Neg Hx     PE: BP 120/72   Pulse 60   Ht 5' 5.25" (1.657 m)   Wt 160 lb (72.6 kg)   SpO2 95%   BMI 26.42 kg/m  Body mass index is 26.42 kg/m. Wt Readings from Last 3 Encounters:  12/06/17 160 lb (72.6 kg)  12/06/16 154 lb (69.9 kg)  10/18/16 149 lb (67.6 kg)   Constitutional: Normal weight, in NAD Eyes: PERRLA, EOMI, no exophthalmos ENT: moist mucous membranes, no neck masses palpated, no cervical lymphadenopathy Cardiovascular: RRR, No MRG Respiratory: CTA B Gastrointestinal: abdomen soft, NT, ND, BS+ Musculoskeletal: no deformities, strength intact in all 4 Skin: moist, warm, no rashes Neurological: no tremor with outstretched hands, DTR normal in all  4  ASSESSMENT: 1. Hypothyroidism  2. History of thyroid cancer  PLAN:  1. Patient with  long-standing, postsurgical hypothyroidism, on levothyroxine therapy - latest thyroid labs reviewed with pt >> normal, however, we left her message to decrease the dose after last visit since she was having hot flashes.  She does not remember this and she continues at the same dose. - she continues on LT4 88 mcg daily - pt feels good on this dose. - we discussed about taking the thyroid hormone every day, with water, >30 minutes before breakfast, separated by >4 hours from acid reflux medications, calcium, iron, multivitamins. Pt. is taking it correctly, however, she is now off the medication for 2 weeks pending this appointment just in case we needed to change the dose.  I strongly advised her not to do this anymore, this is a medication that needs to be taken every single day.  I will call in another prescription for her now and have her back for labs - will check thyroid tests in 1.5 months: TSH and fT4.  I advised her to be off her B vitamins for 1 week before labs are drawn. - If labs are abnormal, she will need to return for repeat TFTs in 1.5 months  2. PTC -In remission -Reviewed records available from Dr. Celine Ahr -Reviewed latest thyroglobulin + ATA: Thyroglobulin at the lower limit of normal, but detectable, ATA undetectable -We discussed about having her back in 6 months to recheck her thyroglobulin, but she did not return for this. -At next blood draw, we will check anotherTg + ATA and we discussed that we will need a neck ultrasound if Tg increasing.   Philemon Kingdom, MD PhD [facility] Endocrinology

## 2018-01-03 DIAGNOSIS — E7800 Pure hypercholesterolemia, unspecified: Secondary | ICD-10-CM | POA: Diagnosis not present

## 2018-01-03 DIAGNOSIS — I10 Essential (primary) hypertension: Secondary | ICD-10-CM | POA: Diagnosis not present

## 2018-01-11 ENCOUNTER — Other Ambulatory Visit: Payer: Self-pay | Admitting: Endocrinology

## 2018-01-11 ENCOUNTER — Other Ambulatory Visit (INDEPENDENT_AMBULATORY_CARE_PROVIDER_SITE_OTHER): Payer: Medicare Other

## 2018-01-11 DIAGNOSIS — Z85850 Personal history of malignant neoplasm of thyroid: Secondary | ICD-10-CM

## 2018-01-11 DIAGNOSIS — E890 Postprocedural hypothyroidism: Secondary | ICD-10-CM

## 2018-01-11 LAB — T4, FREE: Free T4: 0.67 ng/dL (ref 0.60–1.60)

## 2018-01-11 LAB — TSH: TSH: 13.4 u[IU]/mL — AB (ref 0.35–4.50)

## 2018-01-13 LAB — THYROGLOBULIN ANTIBODY: Thyroglobulin Ab: <1 IU/mL (ref <=1)

## 2018-01-13 LAB — THYROGLOBULIN LEVEL: THYROGLOBULIN: 0.1 ng/mL — AB

## 2018-01-16 ENCOUNTER — Telehealth: Payer: Self-pay

## 2018-01-16 ENCOUNTER — Other Ambulatory Visit: Payer: Medicare Other

## 2018-01-16 NOTE — Telephone Encounter (Signed)
Left message for patient to return our call at [phone].  

## 2018-01-16 NOTE — Telephone Encounter (Signed)
-----   Message from Philemon Kingdom, MD sent at 01/15/2018  8:17 AM EST ----- Lenna Sciara, can you please call pt: Please also let her know that her thyroglobulin is very low and her antithyroglobulin antibodies are undetectable, which are both good news.  Please let me know what she says about the levothyroxine dosing (please see previous message).

## 2018-01-22 NOTE — Telephone Encounter (Signed)
No response

## 2018-02-04 DIAGNOSIS — J40 Bronchitis, not specified as acute or chronic: Secondary | ICD-10-CM | POA: Diagnosis not present

## 2018-02-04 DIAGNOSIS — R05 Cough: Secondary | ICD-10-CM | POA: Diagnosis not present

## 2018-02-06 ENCOUNTER — Telehealth: Payer: Self-pay | Admitting: Internal Medicine

## 2018-02-06 NOTE — Telephone Encounter (Signed)
Pt is aware of results. In answer to the questions  1. She did start the 88 mcg daily 2. She did not change the process she was following to take it  Pt is set up for labs as requested on 02/18/18 just need the orders put in if they have not been already.

## 2018-02-07 ENCOUNTER — Other Ambulatory Visit: Payer: Self-pay

## 2018-02-07 DIAGNOSIS — E890 Postprocedural hypothyroidism: Secondary | ICD-10-CM

## 2018-02-18 ENCOUNTER — Other Ambulatory Visit (INDEPENDENT_AMBULATORY_CARE_PROVIDER_SITE_OTHER): Payer: Medicare Other

## 2018-02-18 DIAGNOSIS — E890 Postprocedural hypothyroidism: Secondary | ICD-10-CM | POA: Diagnosis not present

## 2018-02-18 LAB — T4, FREE: FREE T4: 0.69 ng/dL (ref 0.60–1.60)

## 2018-02-18 LAB — TSH: TSH: 9.04 u[IU]/mL — AB (ref 0.35–4.50)

## 2018-02-20 ENCOUNTER — Other Ambulatory Visit: Payer: Self-pay

## 2018-02-20 DIAGNOSIS — E890 Postprocedural hypothyroidism: Secondary | ICD-10-CM

## 2018-02-20 MED ORDER — SYNTHROID 100 MCG PO TABS: ORAL_TABLET | ORAL | 3 refills | Status: DC

## 2018-04-03 ENCOUNTER — Other Ambulatory Visit (INDEPENDENT_AMBULATORY_CARE_PROVIDER_SITE_OTHER): Payer: Medicare Other

## 2018-04-03 DIAGNOSIS — E890 Postprocedural hypothyroidism: Secondary | ICD-10-CM

## 2018-04-03 LAB — T4, FREE: FREE T4: 1.14 ng/dL (ref 0.60–1.60)

## 2018-04-03 LAB — TSH: TSH: 0.88 u[IU]/mL (ref 0.35–4.50)

## 2018-04-04 NOTE — Progress Notes (Signed)
Result letter sent.

## 2018-07-16 DIAGNOSIS — L570 Actinic keratosis: Secondary | ICD-10-CM | POA: Diagnosis not present

## 2018-07-17 ENCOUNTER — Telehealth: Payer: Self-pay | Admitting: Internal Medicine

## 2018-07-17 NOTE — Telephone Encounter (Signed)
Patient states she believes she has an ulcer.  States that she burps a lot and had really bad indigestion.  Patient states she does not have access to complete a virtual visit.  Is requesting Dr. Alain Marion to see her in office next week. Please advise.

## 2018-07-18 NOTE — Telephone Encounter (Signed)
Patient states she will set an appointment up with her GI doctor in regard.

## 2018-07-18 NOTE — Telephone Encounter (Signed)
noted 

## 2018-07-18 NOTE — Telephone Encounter (Signed)
Okay to schedule in office.

## 2018-07-22 ENCOUNTER — Encounter: Payer: Self-pay | Admitting: General Surgery

## 2018-07-23 ENCOUNTER — Other Ambulatory Visit: Payer: Self-pay

## 2018-07-23 ENCOUNTER — Ambulatory Visit (INDEPENDENT_AMBULATORY_CARE_PROVIDER_SITE_OTHER): Payer: Medicare Other | Admitting: Internal Medicine

## 2018-07-23 ENCOUNTER — Encounter: Payer: Self-pay | Admitting: Internal Medicine

## 2018-07-23 VITALS — Ht 66.0 in | Wt 157.0 lb

## 2018-07-23 DIAGNOSIS — R1310 Dysphagia, unspecified: Secondary | ICD-10-CM

## 2018-07-23 DIAGNOSIS — R1319 Other dysphagia: Secondary | ICD-10-CM

## 2018-07-23 DIAGNOSIS — R12 Heartburn: Secondary | ICD-10-CM | POA: Diagnosis not present

## 2018-07-23 NOTE — Progress Notes (Signed)
    TELEHEALTH ENCOUNTER IN SETTING OF COVID-19 PANDEMIC - REQUESTED BY PATIENT SERVICE PROVIDED BY TELEMEDECINE - TYPE: Telephone for patient request  PATIENT LOCATION: Home  PATIENT HAS CONSENTED TO TELEHEALTH VISIT PROVIDER LOCATION: OFFICE REFERRING PROVIDER: None PARTICIPANTS OTHER THAN PATIENT: None TIME SPENT ON CALL: 15 minutes    Lauren Mcgee 77 y.o. 1940-10-01 [id number]  Assessment & Plan:   Encounter Diagnoses  Name Primary?  . Esophageal dysphagia Yes  . Heartburn     EGD possible esophageal dilation GERD diet Stay on OTC Prilosec The risks and benefits as well as alternatives of endoscopic procedure(s) have been discussed and reviewed. All questions answered. The patient agrees to proceed.  Explained she could contract Coronavirus through this experience. She understands.   Subjective:   Chief Complaint: Indigestion and swallowing difficulty  HPI Post-prandial indigestion going on x couple of years Tried Prilosec and it helped - OTC x 1 week  Sometimes has soild dysphagia - relates it to thyroid tumor - went away and now coming back Drinks a lot of tea - not sweetened  2 diminutive adenomas in August 2018 on her last colonoscopy no recall planned due to her age and those findings. Allergies  Allergen Reactions  . Influenza Vaccines     Local reaction   Current Meds  Medication Sig  . aspirin 81 MG chewable tablet Chew 81 mg by mouth daily.    Marland Kitchen atenolol (TENORMIN) 25 MG tablet TAKE 1 TABLET BY MOUTH EVERY DAY  . b complex vitamins capsule Take 1 capsule by mouth daily.  . cholecalciferol (VITAMIN D) 1000 UNITS tablet Take 1,000 Units by mouth daily.    Marland Kitchen Cod Liver Oil 1000 MG CAPS Take 2 capsules by mouth daily.  . DENTA 5000 PLUS 1.1 % CREA dental cream   . escitalopram (LEXAPRO) 10 MG tablet Take 1 tablet (10 mg total) by mouth daily.  Marland Kitchen MAGNESIUM PO Take 250 mg by mouth daily.  . Omega-3 Fatty Acids (FISH OIL) 1000 MG CAPS Take 1-2 capsules by  mouth daily.  Marland Kitchen omeprazole (PRILOSEC OTC) 20 MG tablet Take 20 mg by mouth daily.  Marland Kitchen saccharomyces boulardii (FLORASTOR) 250 MG capsule Take 1 capsule (250 mg total) by mouth daily as needed.  Marland Kitchen SYNTHROID 100 MCG tablet Take 1 tablet daily before breakfast   Past Medical History:  Diagnosis Date  . Diverticulosis of colon (without mention of hemorrhage)   . GERD (gastroesophageal reflux disease)   . Hx of colonic polyp 2001   TUBULAR ADENOMA  . Hyperlipidemia   . Hypertension   . Hypothyroidism   . IBS (irritable bowel syndrome)   . PVD (peripheral vascular disease) ([place])   . Thyroid cancer ([place]) 2011   Past Surgical History:  Procedure Laterality Date  . ABDOMINAL HYSTERECTOMY  1991  . COLONOSCOPY  2010   562.10  . THYROIDECTOMY  2010   and 131I treatment at [facility]- Dr. Celine Ahr   Social History   Social History Narrative   Retired   Married 2 kids   Regular exercise: not outside the home    Caffeine use: coffee daily   Former smoker, no alcohol, no drugs   family history includes Colon cancer (age of onset: 71) in her son; Colon cancer (age of onset: 52) in her paternal aunt; Heart disease in her mother; Hyperlipidemia in her mother; Hypertension in an other family member.   Review of Systems As per HPI.  Otherwise without abnormality

## 2018-07-23 NOTE — Patient Instructions (Addendum)
Good to talk today.  I am glad the Prilosec is helping. Stay on that.  I have included reflux diet information for you.  You have been scheduled for an endoscopy. Please follow written instructions given to you at your visit today. If you use inhalers (even only as needed), please bring them with you on the day of your procedure.  I appreciate the opportunity to care for you. Gatha Mayer, MD, [facility]

## 2018-07-30 ENCOUNTER — Telehealth: Payer: Self-pay | Admitting: *Deleted

## 2018-07-30 NOTE — Telephone Encounter (Signed)
Covid-19 travel screening questions  Have you traveled in the last 14 days? no If yes where?  Do you now or have you had a fever in the last 14 days? no  Do you have any respiratory symptoms of shortness of breath or cough now or in the last 14 days? no  Do you have any family members or close contacts with diagnosed or suspected Covid-19? No  Pt is aware that care partner will be waiting in the car during procedure.  She will wear a mask into the building

## 2018-07-31 ENCOUNTER — Ambulatory Visit (AMBULATORY_SURGERY_CENTER): Payer: Medicare Other | Admitting: Internal Medicine

## 2018-07-31 ENCOUNTER — Encounter: Payer: Self-pay | Admitting: Internal Medicine

## 2018-07-31 ENCOUNTER — Other Ambulatory Visit: Payer: Self-pay

## 2018-07-31 VITALS — BP 140/74 | HR 57 | Temp 98.1°F | Resp 17 | Ht 66.0 in | Wt 157.0 lb

## 2018-07-31 DIAGNOSIS — R1310 Dysphagia, unspecified: Secondary | ICD-10-CM | POA: Diagnosis not present

## 2018-07-31 DIAGNOSIS — K210 Gastro-esophageal reflux disease with esophagitis: Secondary | ICD-10-CM

## 2018-07-31 DIAGNOSIS — K222 Esophageal obstruction: Secondary | ICD-10-CM | POA: Diagnosis not present

## 2018-07-31 DIAGNOSIS — K317 Polyp of stomach and duodenum: Secondary | ICD-10-CM | POA: Diagnosis not present

## 2018-07-31 MED ORDER — SODIUM CHLORIDE 0.9 % IV SOLN: 500.0000 mL | INTRAVENOUS | Status: DC

## 2018-07-31 NOTE — Progress Notes (Signed)
Called to room to assist during endoscopic procedure.  Patient ID and intended procedure confirmed with present staff. Received instructions for my participation in the procedure from the performing physician.  

## 2018-07-31 NOTE — Progress Notes (Signed)
Temps taken by [facility], [place]. V/S taken by Rica Mote, CMA

## 2018-07-31 NOTE — Patient Instructions (Addendum)
There was a mild stricture (narrowing) where the esophagus and stomach meet - I dilated it so no food stops in there. Mild inflammation also seen.  Stay on the Prilosec - don't stop.  Other findings slight stomach irritation and some innocent stomach polyps. The polyps were biopsied in 2008 and look unchanged.   I appreciate the opportunity to care for you. Gatha Mayer, MD, [facility]   Discharge instructions given. Normal exam. Resume previous medications. YOU HAD AN ENDOSCOPIC PROCEDURE TODAY AT [facility]:   Refer to the procedure report that was given to you for any specific questions about what was found during the examination.  If the procedure report does not answer your questions, please call your gastroenterologist to clarify.  If you requested that your care partner not be given the details of your procedure findings, then the procedure report has been included in a sealed envelope for you to review at your convenience later.  YOU SHOULD EXPECT: Some feelings of bloating in the abdomen. Passage of more gas than usual.  Walking can help get rid of the air that was put into your GI tract during the procedure and reduce the bloating. If you had a lower endoscopy (such as a colonoscopy or flexible sigmoidoscopy) you may notice spotting of blood in your stool or on the toilet paper. If you underwent a bowel prep for your procedure, you may not have a normal bowel movement for a few days.  Please Note:  You might notice some irritation and congestion in your nose or some drainage.  This is from the oxygen used during your procedure.  There is no need for concern and it should clear up in a day or so.  SYMPTOMS TO REPORT IMMEDIATELY:    Following upper endoscopy (EGD)  Vomiting of blood or coffee ground material  New chest pain or pain under the shoulder blades  Painful or persistently difficult swallowing  New shortness of breath  Fever of 100F or higher  Black, tarry-looking stools  For urgent or emergent issues, a gastroenterologist can be reached at any hour by calling [phone].   DIET:  We do recommend a small meal at first, but then you may proceed to your regular diet.  Drink plenty of fluids but you should avoid alcoholic beverages for 24 hours.  ACTIVITY:  You should plan to take it easy for the rest of today and you should NOT DRIVE or use heavy machinery until tomorrow (because of the sedation medicines used during the test).    FOLLOW UP: Our staff will call the number listed on your records 48-72 hours following your procedure to check on you and address any questions or concerns that you may have regarding the information given to you following your procedure. If we do not reach you, we will leave a message.  We will attempt to reach you two times.  During this call, we will ask if you have developed any symptoms of COVID 19. If you develop any symptoms (ie: fever, flu-like symptoms, shortness of breath, cough etc.) before then, please call [phone].  If you test positive for Covid 19 in the 2 weeks post procedure, please call and report this information to [place].    If any biopsies were taken you will be contacted by phone or by letter within the next 1-3 weeks.  Please call us at [phone] if you have not heard about the biopsies in 3 weeks.  SIGNATURES/CONFIDENTIALITY: You and/or your care partner have signed paperwork which will be entered into your electronic medical record.  These signatures attest to the fact that that the information above on your After Visit Summary has been reviewed and is understood.  Full responsibility of the confidentiality of this discharge information lies with you and/or your care-partner.

## 2018-07-31 NOTE — Op Note (Signed)
[facility] Patient Name: Lauren Mcgee Procedure Date: 07/31/2018 1:59 PM MRN: [id number] Endoscopist: Gatha Mayer , MD Age: 78 Referring MD:  Date of Birth: 11-Jul-1940 Gender: Female Account #: [id number] Procedure:                Upper GI endoscopy Indications:              Dysphagia, Heartburn Medicines:                Propofol per Anesthesia, Monitored Anesthesia Care Procedure:                Pre-Anesthesia Assessment:                           - Prior to the procedure, a History and Physical                            was performed, and patient medications and                            allergies were reviewed. The patient's tolerance of                            previous anesthesia was also reviewed. The risks                            and benefits of the procedure and the sedation                            options and risks were discussed with the patient.                            All questions were answered, and informed consent                            was obtained. Prior Anticoagulants: The patient has                            taken no previous anticoagulant or antiplatelet                            agents. ASA Grade Assessment: II - A patient with                            mild systemic disease. After reviewing the risks                            and benefits, the patient was deemed in                            satisfactory condition to undergo the procedure.                           After obtaining informed consent, the endoscope was  passed under direct vision. Throughout the                            procedure, the patient's blood pressure, pulse, and                            oxygen saturations were monitored continuously. The                            Endoscope was introduced through the mouth, and                            advanced to the second part of duodenum. The upper                            GI endoscopy  was accomplished without difficulty.                            The patient tolerated the procedure well. Scope In: Scope Out: Findings:                 One benign-appearing, intrinsic mild stenosis was                            found at the gastroesophageal junction. The                            stenosis was traversed. A TTS dilator was passed                            through the scope. Dilation with an 18-19-20 mm                            balloon dilator was performed to 20 mm. The                            dilation site was examined and showed mild mucosal                            disruption. Estimated blood loss was minimal.                           LA Grade A (one or more mucosal breaks less than 5                            mm, not extending between tops of 2 mucosal folds)                            esophagitis with no bleeding was found in the                            distal esophagus.  Multiple small semi-sessile polyps with no stigmata                            of recent bleeding were found in the gastric body.                           Patchy mildly erythematous mucosa was found in the                            prepyloric region of the stomach.                           The exam was otherwise without abnormality.                           The cardia and gastric fundus were normal on                            retroflexion. Complications:            No immediate complications. Estimated Blood Loss:     Estimated blood loss was minimal. Impression:               - Benign-appearing esophageal stenosis. Dilated.                           - LA Grade A reflux esophagitis.                           - Multiple gastric polyps. Consistent with benign                            fundic gland polps as seen and biopsied 2008                           - Erythematous mucosa in the prepyloric region of                            the stomach.                            - The examination was otherwise normal.                           - No specimens collected. Recommendation:           - Patient has a contact number available for                            emergencies. The signs and symptoms of potential                            delayed complications were discussed with the                            patient. Return to normal activities tomorrow.  Written discharge instructions were provided to the                            patient.                           - Clear liquids x 1 hour then soft foods rest of                            day. Start prior diet tomorrow.                           - Continue present medications.                           - Stay on Prilosec (omeprazole)                           Return as needed Gatha Mayer, MD 07/31/2018 2:41:22 PM This report has been signed electronically.

## 2018-07-31 NOTE — Progress Notes (Signed)
Report to PACU, RN, vss, BBS= Clear.  

## 2018-08-02 ENCOUNTER — Telehealth: Payer: Self-pay | Admitting: *Deleted

## 2018-08-02 NOTE — Telephone Encounter (Signed)
  Follow up Call-  Call back number 07/31/2018 10/18/2016  Post procedure Call Back phone  # [phone]  Permission to leave phone message Yes Yes  Some recent data might be hidden     Patient questions:  Do you have a fever, pain , or abdominal swelling? No. Pain Score  0 *  Have you tolerated food without any problems? Yes.    Have you been able to return to your normal activities? Yes.    Do you have any questions about your discharge instructions: Diet   No. Medications  No. Follow up visit  No.  Do you have questions or concerns about your Care? No.  Actions: * If pain score is 4 or above: No action needed, pain <4.  1. Have you developed a fever since your procedure? no  2.   Have you had an respiratory symptoms (SOB or cough) since your procedure? no  3.   Have you tested positive for COVID 19 since your procedure no  4.   Have you had any family members/close contacts diagnosed with the COVID 19 since your procedure?  no   If yes to any of these questions please route to Joylene John, RN and Alphonsa Gin, Therapist, sports.

## 2018-11-19 ENCOUNTER — Encounter: Payer: Self-pay | Admitting: Internal Medicine

## 2018-11-19 ENCOUNTER — Other Ambulatory Visit: Payer: Self-pay | Admitting: Internal Medicine

## 2018-11-19 ENCOUNTER — Other Ambulatory Visit: Payer: Self-pay

## 2018-11-19 ENCOUNTER — Ambulatory Visit (INDEPENDENT_AMBULATORY_CARE_PROVIDER_SITE_OTHER): Payer: Medicare Other | Admitting: Internal Medicine

## 2018-11-19 VITALS — BP 136/70 | HR 55 | Temp 98.4°F | Ht 66.0 in | Wt 156.0 lb

## 2018-11-19 DIAGNOSIS — I6523 Occlusion and stenosis of bilateral carotid arteries: Secondary | ICD-10-CM

## 2018-11-19 DIAGNOSIS — Z0000 Encounter for general adult medical examination without abnormal findings: Secondary | ICD-10-CM

## 2018-11-19 DIAGNOSIS — F411 Generalized anxiety disorder: Secondary | ICD-10-CM

## 2018-11-19 DIAGNOSIS — I10 Essential (primary) hypertension: Secondary | ICD-10-CM

## 2018-11-19 DIAGNOSIS — K635 Polyp of colon: Secondary | ICD-10-CM | POA: Diagnosis not present

## 2018-11-19 DIAGNOSIS — Z23 Encounter for immunization: Secondary | ICD-10-CM

## 2018-11-19 DIAGNOSIS — E890 Postprocedural hypothyroidism: Secondary | ICD-10-CM

## 2018-11-19 DIAGNOSIS — E785 Hyperlipidemia, unspecified: Secondary | ICD-10-CM

## 2018-11-19 NOTE — Assessment & Plan Note (Signed)
We discussed age appropriate health related issues, including available/recomended screening tests and vaccinations. We discussed a need for adhering to healthy diet and exercise. Labs were ordered to be later reviewed . All questions were answered. Dr Carlean Purl Pt declined a tDAP Last colon 2018 -  no need to repeat A cardiac CT scan for coronary calcium offered 9/20

## 2018-11-19 NOTE — Assessment & Plan Note (Signed)
Synthroid 

## 2018-11-19 NOTE — Assessment & Plan Note (Signed)
The pt was taking Crestor 3/wk for Carotid disease - stopped

## 2018-11-19 NOTE — Assessment & Plan Note (Signed)
Atenolol

## 2018-11-19 NOTE — Assessment & Plan Note (Addendum)
Doppler US Re-start Statin

## 2018-11-19 NOTE — Patient Instructions (Addendum)
Cardiac CT calcium scoring test $150   Computed tomography, more commonly known as a CT or CAT scan, is a diagnostic medical imaging test. Like traditional x-rays, it produces multiple images or pictures of the inside of the body. The cross-sectional images generated during a CT scan can be reformatted in multiple planes. They can even generate three-dimensional images. These images can be viewed on a computer monitor, printed on film or by a 3D printer, or transferred to a CD or DVD. CT images of internal organs, bones, soft tissue and blood vessels provide greater detail than traditional x-rays, particularly of soft tissues and blood vessels. A cardiac CT scan for coronary calcium is a non-invasive way of obtaining information about the presence, location and extent of calcified plaque in the coronary arteries-the vessels that supply oxygen-containing blood to the heart muscle. Calcified plaque results when there is a build-up of fat and other substances under the inner layer of the artery. This material can calcify which signals the presence of atherosclerosis, a disease of the vessel wall, also called coronary artery disease (CAD). People with this disease have an increased risk for heart attacks. In addition, over time, progression of plaque build up (CAD) can narrow the arteries or even close off blood flow to the heart. The result may be chest pain, sometimes called "angina," or a heart attack. Because calcium is a marker of CAD, the amount of calcium detected on a cardiac CT scan is a helpful prognostic tool. The findings on cardiac CT are expressed as a calcium score. Another name for this test is coronary artery calcium scoring.  What are some common uses of the procedure? The goal of cardiac CT scan for calcium scoring is to determine if CAD is present and to what extent, even if there are no symptoms. It is a screening study that may be recommended by a physician for patients with risk factors  for CAD but no clinical symptoms. The major risk factors for CAD are: . high blood cholesterol levels  . family history of heart attacks  . diabetes  . high blood pressure  . cigarette smoking  . overweight or obese  . physical inactivity   A negative cardiac CT scan for calcium scoring shows no calcification within the coronary arteries. This suggests that CAD is absent or so minimal it cannot be seen by this technique. The chance of having a heart attack over the next two to five years is very low under these circumstances. A positive test means that CAD is present, regardless of whether or not the patient is experiencing any symptoms. The amount of calcification-expressed as the calcium score-may help to predict the likelihood of a myocardial infarction (heart attack) in the coming years and helps your medical doctor or cardiologist decide whether the patient may need to take preventive medicine or undertake other measures such as diet and exercise to lower the risk for heart attack. The extent of CAD is graded according to your calcium score:  Calcium Score  Presence of CAD (coronary artery disease)  0 No evidence of CAD   1-10 Minimal evidence of CAD  11-100 Mild evidence of CAD  101-400 Moderate evidence of CAD  Over 400 Extensive evidence of CAD      If you have medicare related insurance (such as traditional Medicare, Blue Cross Medicare, United HealthCare Medicare, or similar), Please make an appointment at the scheduling desk with Jill, the Wellness Health Coach, for your Wellness visit in this office,   which is a benefit with your insurance.  

## 2018-11-19 NOTE — Progress Notes (Signed)
Subjective:  Patient ID: Lauren Mcgee, female    DOB: 07/31/1940  Age: 78 y.o. MRN: [id number]  CC: No chief complaint on file.   HPI Lauren Mcgee presents for a well exam  f/u dyslipidemia - stopped Rx a year ago  Outpatient Medications Prior to Visit  Medication Sig Dispense Refill  . aspirin 81 MG chewable tablet Chew 81 mg by mouth daily.      Marland Kitchen atenolol (TENORMIN) 25 MG tablet TAKE 1 TABLET BY MOUTH EVERY DAY 90 tablet 3  . b complex vitamins capsule Take 1 capsule by mouth daily.    . cholecalciferol (VITAMIN D) 1000 UNITS tablet Take 1,000 Units by mouth daily.      Marland Kitchen Cod Liver Oil 1000 MG CAPS Take 2 capsules by mouth daily.    . DENTA 5000 PLUS 1.1 % CREA dental cream     . escitalopram (LEXAPRO) 10 MG tablet Take 1 tablet (10 mg total) by mouth daily. 30 tablet 11  . MAGNESIUM PO Take 250 mg by mouth daily.    . Omega-3 Fatty Acids (FISH OIL) 1000 MG CAPS Take 1-2 capsules by mouth daily.    Marland Kitchen omeprazole (PRILOSEC OTC) 20 MG tablet Take 20 mg by mouth daily.    Marland Kitchen saccharomyces boulardii (FLORASTOR) 250 MG capsule Take 1 capsule (250 mg total) by mouth daily as needed.    Marland Kitchen SYNTHROID 100 MCG tablet Take 1 tablet daily before breakfast 90 tablet 3   No facility-administered medications prior to visit.     ROS: Review of Systems  Constitutional: Negative for activity change, appetite change, chills, diaphoresis, fatigue, fever and unexpected weight change.  HENT: Negative for congestion, dental problem, ear pain, hearing loss, mouth sores, postnasal drip, sinus pressure, sneezing, sore throat and voice change.   Eyes: Negative for pain and visual disturbance.  Respiratory: Negative for cough, chest tightness, wheezing and stridor.   Cardiovascular: Negative for chest pain, palpitations and leg swelling.  Gastrointestinal: Negative for abdominal distention, abdominal pain, blood in stool, nausea, rectal pain and vomiting.  Genitourinary: Negative for decreased urine  volume, difficulty urinating, dysuria, frequency, hematuria, menstrual problem, vaginal bleeding, vaginal discharge and vaginal pain.  Musculoskeletal: Positive for arthralgias. Negative for back pain, gait problem, joint swelling and neck pain.  Skin: Negative for color change, rash and wound.  Neurological: Negative for dizziness, tremors, syncope, speech difficulty, weakness and light-headedness.  Hematological: Negative for adenopathy.  Psychiatric/Behavioral: Negative for behavioral problems, confusion, decreased concentration, dysphoric mood, hallucinations, sleep disturbance and suicidal ideas. The patient is not nervous/anxious and is not hyperactive.     Objective:  BP 136/70 (BP Location: Left Arm, Patient Position: Sitting, Cuff Size: Normal)   Pulse (!) 55   Temp 98.4 F (36.9 C) (Oral)   Ht 5\' 6"  (1.676 m)   Wt 156 lb (70.8 kg)   SpO2 97%   BMI 25.18 kg/m   BP Readings from Last 3 Encounters:  11/19/18 136/70  07/31/18 140/74  12/06/17 120/72    Wt Readings from Last 3 Encounters:  11/19/18 156 lb (70.8 kg)  07/31/18 157 lb (71.2 kg)  07/23/18 157 lb (71.2 kg)    Physical Exam Constitutional:      General: She is not in acute distress.    Appearance: She is well-developed.  HENT:     Head: Normocephalic.     Right Ear: External ear normal.     Left Ear: External ear normal.     Nose: Nose  normal.  Eyes:     General:        Right eye: No discharge.        Left eye: No discharge.     Conjunctiva/sclera: Conjunctivae normal.     Pupils: Pupils are equal, round, and reactive to light.  Neck:     Musculoskeletal: Normal range of motion and neck supple.     Thyroid: No thyromegaly.     Vascular: No JVD.     Trachea: No tracheal deviation.  Cardiovascular:     Rate and Rhythm: Normal rate and regular rhythm.     Heart sounds: Normal heart sounds.  Pulmonary:     Effort: No respiratory distress.     Breath sounds: No stridor. No wheezing.  Abdominal:      General: Bowel sounds are normal. There is no distension.     Palpations: Abdomen is soft. There is no mass.     Tenderness: There is no abdominal tenderness. There is no guarding or rebound.  Musculoskeletal:        General: No tenderness.  Lymphadenopathy:     Cervical: No cervical adenopathy.  Skin:    Findings: No erythema or rash.  Neurological:     Cranial Nerves: No cranial nerve deficit.     Motor: No abnormal muscle tone.     Coordination: Coordination normal.     Deep Tendon Reflexes: Reflexes normal.  Psychiatric:        Behavior: Behavior normal.        Thought Content: Thought content normal.        Judgment: Judgment normal.     Lab Results  Component Value Date   WBC 7.9 08/28/2016   HGB 14.7 08/28/2016   HCT 41.7 08/28/2016   PLT 225.0 08/28/2016   GLUCOSE 101 (H) 08/28/2016   CHOL 187 08/28/2016   TRIG 94.0 08/28/2016   HDL 54.50 08/28/2016   LDLDIRECT 158.1 11/24/2008   LDLCALC 114 (H) 08/28/2016   ALT 24 08/28/2016   AST 21 08/28/2016   NA 139 08/28/2016   K 3.9 08/28/2016   CL 104 08/28/2016   CREATININE 0.85 08/28/2016   BUN 11 08/28/2016   CO2 26 08/28/2016   TSH 0.88 04/03/2018   HGBA1C 5.5 05/05/2015    US Soft Tissue Head/neck  Result Date: 12/09/2012 CLINICAL DATA:  Thyroid cancer post thyroidectomy and radioactive iodine ablation in 2010 EXAM: THYROID ULTRASOUND TECHNIQUE: Ultrasound examination of the thyroid gland and adjacent soft tissues was performed. COMPARISON:  None FINDINGS: Right thyroid lobe Surgically absent. No residual nodule or tissue identified at the right thyroid bed. Left thyroid lobe Surgically absent. No residual nodular tissue identified at the left thyroid bed. Isthmus Surgically absent. Lymphadenopathy None visualized. IMPRESSION: Post thyroidectomy. No evidence of residual thyroid tissue or mass/nodule at the thyroid bed. Electronically Signed   By: Lavonia Dana M.D.   On: 12/09/2012 14:09    Assessment & Plan:    There are no diagnoses linked to this encounter.   No orders of the defined types were placed in this encounter.    Follow-up: No follow-ups on file.  Walker Kehr, MD

## 2018-11-19 NOTE — Assessment & Plan Note (Signed)
Last colon 2081 no need to repeat

## 2018-11-21 NOTE — Addendum Note (Signed)
Addended by: Karren Cobble on: 11/21/2018 11:00 AM   Modules accepted: Orders

## 2018-11-22 ENCOUNTER — Other Ambulatory Visit (INDEPENDENT_AMBULATORY_CARE_PROVIDER_SITE_OTHER): Payer: Medicare Other

## 2018-11-22 DIAGNOSIS — Z0000 Encounter for general adult medical examination without abnormal findings: Secondary | ICD-10-CM

## 2018-11-22 DIAGNOSIS — E785 Hyperlipidemia, unspecified: Secondary | ICD-10-CM

## 2018-11-22 LAB — URINALYSIS, ROUTINE W REFLEX MICROSCOPIC
Bilirubin Urine: NEGATIVE
Hgb urine dipstick: NEGATIVE
Ketones, ur: NEGATIVE
Nitrite: NEGATIVE
RBC / HPF: NONE SEEN (ref 0–?)
Specific Gravity, Urine: 1.02 (ref 1.000–1.030)
Total Protein, Urine: NEGATIVE
Urine Glucose: NEGATIVE
Urobilinogen, UA: 0.2 (ref 0.0–1.0)
pH: 6 (ref 5.0–8.0)

## 2018-11-22 LAB — CBC WITH DIFFERENTIAL/PLATELET
Basophils Absolute: 0 10*3/uL (ref 0.0–0.1)
Basophils Relative: 0.7 % (ref 0.0–3.0)
Eosinophils Absolute: 0.2 10*3/uL (ref 0.0–0.7)
Eosinophils Relative: 3.1 % (ref 0.0–5.0)
HCT: 42.1 % (ref 36.0–46.0)
Hemoglobin: 14.4 g/dL (ref 12.0–15.0)
Lymphocytes Relative: 23.2 % (ref 12.0–46.0)
Lymphs Abs: 1.5 10*3/uL (ref 0.7–4.0)
MCHC: 34.1 g/dL (ref 30.0–36.0)
MCV: 85.3 fl (ref 78.0–100.0)
Monocytes Absolute: 0.5 10*3/uL (ref 0.1–1.0)
Monocytes Relative: 7.7 % (ref 3.0–12.0)
Neutro Abs: 4.3 10*3/uL (ref 1.4–7.7)
Neutrophils Relative %: 65.3 % (ref 43.0–77.0)
Platelets: 157 10*3/uL (ref 150.0–400.0)
RBC: 4.94 Mil/uL (ref 3.87–5.11)
RDW: 13.9 % (ref 11.5–15.5)
WBC: 6.5 10*3/uL (ref 4.0–10.5)

## 2018-11-22 LAB — BASIC METABOLIC PANEL
BUN: 8 mg/dL (ref 6–23)
CO2: 25 mEq/L (ref 19–32)
Calcium: 8.9 mg/dL (ref 8.4–10.5)
Chloride: 106 mEq/L (ref 96–112)
Creatinine, Ser: 0.83 mg/dL (ref 0.40–1.20)
GFR: 66.48 mL/min (ref >60.00)
Glucose, Bld: 97 mg/dL (ref 70–99)
Potassium: 4 mEq/L (ref 3.5–5.1)
Sodium: 140 mEq/L (ref 135–145)

## 2018-11-22 LAB — LIPID PANEL
Cholesterol: 173 mg/dL (ref 0–200)
HDL: 43.2 mg/dL (ref >39.00)
LDL Cholesterol: 97 mg/dL (ref 0–99)
NonHDL: 129.81
Total CHOL/HDL Ratio: 4
Triglycerides: 163 mg/dL — ABNORMAL HIGH (ref 0.0–149.0)
VLDL: 32.6 mg/dL (ref 0.0–40.0)

## 2018-11-22 LAB — HEPATIC FUNCTION PANEL
ALT: 22 U/L (ref 0–35)
AST: 25 U/L (ref 0–37)
Albumin: 4.3 g/dL (ref 3.5–5.2)
Alkaline Phosphatase: 45 U/L (ref 39–117)
Bilirubin, Direct: 0.1 mg/dL (ref 0.0–0.3)
Total Bilirubin: 0.6 mg/dL (ref 0.2–1.2)
Total Protein: 6.7 g/dL (ref 6.0–8.3)

## 2018-11-22 LAB — TSH: TSH: 0.09 u[IU]/mL — ABNORMAL LOW (ref 0.35–4.50)

## 2018-12-05 ENCOUNTER — Other Ambulatory Visit: Payer: Self-pay

## 2018-12-05 ENCOUNTER — Telehealth: Payer: Self-pay | Admitting: Internal Medicine

## 2018-12-05 DIAGNOSIS — E890 Postprocedural hypothyroidism: Secondary | ICD-10-CM

## 2018-12-05 NOTE — Telephone Encounter (Signed)
Copied from [place] [id number]. Topic: General - Other >> Dec 05, 2018  2:49 PM Keene Breath wrote: Reason for CRM: Patient called to ask the nurse to call her so she can ask some questions about her blood work results.  CB# [phone]

## 2018-12-06 ENCOUNTER — Other Ambulatory Visit (INDEPENDENT_AMBULATORY_CARE_PROVIDER_SITE_OTHER): Payer: Medicare Other

## 2018-12-06 DIAGNOSIS — E890 Postprocedural hypothyroidism: Secondary | ICD-10-CM | POA: Diagnosis not present

## 2018-12-06 LAB — T4, FREE: Free T4: 1.05 ng/dL (ref 0.60–1.60)

## 2018-12-06 NOTE — Telephone Encounter (Signed)
Pt notified of results

## 2018-12-08 ENCOUNTER — Other Ambulatory Visit: Payer: Self-pay | Admitting: Internal Medicine

## 2018-12-10 ENCOUNTER — Ambulatory Visit (INDEPENDENT_AMBULATORY_CARE_PROVIDER_SITE_OTHER): Payer: Medicare Other | Admitting: Internal Medicine

## 2018-12-10 ENCOUNTER — Encounter: Payer: Self-pay | Admitting: Internal Medicine

## 2018-12-10 ENCOUNTER — Other Ambulatory Visit: Payer: Self-pay

## 2018-12-10 ENCOUNTER — Ambulatory Visit: Payer: Medicare Other | Admitting: Internal Medicine

## 2018-12-10 VITALS — BP 138/64 | HR 69 | Temp 97.4°F | Ht 65.5 in | Wt 159.2 lb

## 2018-12-10 DIAGNOSIS — Z85850 Personal history of malignant neoplasm of thyroid: Secondary | ICD-10-CM

## 2018-12-10 DIAGNOSIS — E890 Postprocedural hypothyroidism: Secondary | ICD-10-CM | POA: Diagnosis not present

## 2018-12-10 MED ORDER — SYNTHROID 88 MCG PO TABS: 88.0000 ug | ORAL_TABLET | Freq: Every day | ORAL | 3 refills | Status: DC

## 2018-12-10 NOTE — Progress Notes (Signed)
Patient ID: Lauren Mcgee, female   DOB: September 02, 1940, 78 y.o.   MRN: [id number]   HPI  Lauren Mcgee is a 78 y.o.-year-old female, returning for f/u for Thyroid Cancer in remission, and postsurgical hypothyroidism. Last visit 1 year ago.  Reviewed and addended history:: - Diagnosed thyroid cancer in 2010.  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 No nodes sampled. One of the papillary tumors was approaching and may have involved the surgical resection margin. There was minimal extra thyroid though invasion of one of the papillary microcarcinoma's, but no lymphovascular invasion.  - RAI tx with 154 mCi I-131 on 03/16/2010. At that time TSH was>100, thyroglobulin was 0.7, ATA<20  - Post treatment WBS did not show metastatic disease. There were foci of activity which were previously seen on the pretherapy scan. No new lesions identified.  Neck U/S 12/09/2012: Post thyroidectomy. No evidence of residual thyroid tissue or mass/nodule at the thyroid bed.  Reviewed her thyroglobulin and antithyroglobulin antibody levels: Detectable, but very low: Lab Results  Component Value Date   THYROGLB 0.1 (L) 01/11/2018   THYROGLB 0.1 (L) 12/06/2016   THYROGLB <0.1 (L) 10/12/2015   THYROGLB <0.1 (L) 10/12/2014   THYROGLB <0.2 06/24/2013   THYROGLB <0.2 12/02/2012   THGAB <1 01/11/2018   THGAB <1 12/06/2016   THGAB <1 10/12/2015   THGAB <1 10/12/2014   THGAB <20.0 06/24/2013   THGAB <20.0 12/02/2012   Postsurgical hypothyroidism:  Pt is on Synthroid 100 mcg daily (dose increased since last visit), taken: - in am - fasting - at least 30-60 min from b'fast - no Ca, Fe - no MVI - + PPIs once a mo - later in the day - no Biotin  Reviewed patient's TFTs: Lab Results  Component Value Date   TSH 0.09 (L) 11/22/2018   TSH  0.88 04/03/2018   TSH 9.04 (H) 02/18/2018   TSH 13.40 (H) 01/11/2018   TSH 0.88 12/06/2016   TSH 0.72 08/28/2016   TSH 0.78 10/12/2015   TSH 0.16 (L) 05/24/2015   TSH 0.18 (L) 05/05/2015   TSH 0.06 (L) 10/12/2014   FREET4 1.05 12/06/2018   FREET4 1.14 04/03/2018   FREET4 0.69 02/18/2018   FREET4 0.67 01/11/2018   FREET4 1.02 12/06/2016   FREET4 0.93 08/28/2016   FREET4 0.89 10/12/2015   FREET4 1.18 05/24/2015   FREET4 1.07 10/12/2014   FREET4 1.22 06/24/2013   Pt denies: - feeling nodules in neck - hoarseness - choking - SOB with lying down She continues to have dysphagia, which was present even before her thyroidectomy  She is on atenolol 25 mg daily per cardiology  She also has a history of HTN, HL (Crestor >> fatigue), PVD, GERD.   ROS: Constitutional: no weight gain/no weight loss, no fatigue, + subjective hyperthermia, no subjective hypothermia Eyes: no blurry vision, no xerophthalmia ENT: no sore throat, + see HPI Cardiovascular: no CP/no SOB/no palpitations/no leg swelling Respiratory: no cough/no SOB/no wheezing Gastrointestinal: no N/no V/no D/no C/no acid reflux Musculoskeletal: no muscle aches/no joint aches Skin: no rashes, no hair loss Neurological: no tremors/no numbness/no tingling/no dizziness  I reviewed pt's medications, allergies, PMH, social hx, family hx, and changes were documented in the history of present illness. Otherwise, unchanged from my initial visit note.  Past Medical History:  Diagnosis Date  . Diverticulosis of colon (without mention  of hemorrhage)   . GERD (gastroesophageal reflux disease)   . Hx of colonic polyp 2001   TUBULAR ADENOMA  . Hyperlipidemia   . Hypertension   . Hypothyroidism   . IBS (irritable bowel syndrome)   . PVD (peripheral vascular disease) ([place])   . Thyroid cancer ([place]) 2011   Past Surgical History:  Procedure Laterality Date  . ABDOMINAL HYSTERECTOMY  1991  . COLONOSCOPY  2010   562.10  .  ESOPHAGOGASTRODUODENOSCOPY     multiple  . THYROIDECTOMY  2010   and 131I treatment at [facility]- Dr. Celine Ahr   Social History   Socioeconomic History  . Marital status: Married    Spouse name: Not on file  . Number of children: 2  . Years of education: Not on file  . Highest education level: Not on file  Occupational History  . Occupation: retired  Scientific laboratory technician  . Financial resource strain: Not on file  . Food insecurity    Worry: Not on file    Inability: Not on file  . Transportation needs    Medical: Not on file    Non-medical: Not on file  Tobacco Use  . Smoking status: Former Smoker    Packs/day: 0.50    Types: Cigarettes    Quit date: 03/06/1958    Years since quitting: 60.8  . Smokeless tobacco: Never Used  Substance and Sexual Activity  . Alcohol use: No  . Drug use: No  . Sexual activity: Yes  Lifestyle  . Physical activity    Days per week: Not on file    Minutes per session: Not on file  . Stress: Not on file  Relationships  . Social Herbalist on phone: Not on file    Gets together: Not on file    Attends religious service: Not on file    Active member of club or organization: Not on file    Attends meetings of clubs or organizations: Not on file    Relationship status: Not on file  . Intimate partner violence    Fear of current or ex partner: Not on file    Emotionally abused: Not on file    Physically abused: Not on file    Forced sexual activity: Not on file  Other Topics Concern  . Not on file  Social History Narrative   Retired   Married 2 kids   Regular exercise: not outside the home    Caffeine use: coffee daily   Former smoker, no alcohol, no drugs   Current Outpatient Medications on File Prior to Visit  Medication Sig Dispense Refill  . aspirin 81 MG chewable tablet Chew 81 mg by mouth daily.      Marland Kitchen atenolol (TENORMIN) 25 MG tablet TAKE 1 TABLET BY MOUTH EVERY DAY 90 tablet 3  . b complex vitamins capsule Take 1 capsule by  mouth daily.    . cholecalciferol (VITAMIN D) 1000 UNITS tablet Take 1,000 Units by mouth daily.      Marland Kitchen Cod Liver Oil 1000 MG CAPS Take 2 capsules by mouth daily.    . DENTA 5000 PLUS 1.1 % CREA dental cream     . escitalopram (LEXAPRO) 10 MG tablet Take 1 tablet (10 mg total) by mouth daily. 30 tablet 11  . MAGNESIUM PO Take 250 mg by mouth daily.    . Omega-3 Fatty Acids (FISH OIL) 1000 MG CAPS Take 1-2 capsules by mouth daily.    Marland Kitchen omeprazole (PRILOSEC OTC)  20 MG tablet Take 20 mg by mouth daily.    Marland Kitchen saccharomyces boulardii (FLORASTOR) 250 MG capsule Take 1 capsule (250 mg total) by mouth daily as needed.    Marland Kitchen SYNTHROID 100 MCG tablet TAKE 1 TABLET BY MOUTH DAILY BEFORE BREAKFAST 90 tablet 1   No current facility-administered medications on file prior to visit.    Allergies  Allergen Reactions  . Influenza Vaccines     Local reaction   Family History  Problem Relation Age of Onset  . Hyperlipidemia Mother   . Heart disease Mother   . Hypertension Other   . Colon cancer Paternal Aunt 78  . Colon cancer Son 76  . Colon polyps Neg Hx   . Liver cancer Neg Hx   . Pancreatic cancer Neg Hx   . Rectal cancer Neg Hx   . Stomach cancer Neg Hx     PE: BP 138/64   Pulse 69   Temp (!) 97.4 F (36.3 C)   Ht 5' 5.5" (1.664 m)   Wt 159 lb 3.2 oz (72.2 kg)   SpO2 98%   BMI 26.09 kg/m  Body mass index is 26.09 kg/m. Wt Readings from Last 3 Encounters:  12/10/18 159 lb 3.2 oz (72.2 kg)  11/19/18 156 lb (70.8 kg)  07/31/18 157 lb (71.2 kg)   Constitutional:  Normal weight, in NAD Eyes: PERRLA, EOMI, no exophthalmos ENT: moist mucous membranes, no thyromeneck masses palpatedrvical lymphadenopathy Cardiovascular: RRR, No MRG Respiratory: CTA B Gastrointestinal: abdomen soft, NT, ND, BS+ Musculoskeletal: no deformities, strength intact in all 4 Skin: moist, warm, no rashes Neurological: no tremor with outstretched hands, DTR normal in all 4  ASSESSMENT: 1. Hypothyroidism  2.  History of thyroid cancer  PLAN:  1. Patient with longstanding, uncontrolled, postsurgical hypothyroidism, on levothyroxine therapy - latest thyroid labs reviewed with pt >> TSH was suppressed at last check by PCP approximately 2 weeks ago.   - At last visit, she was off her levothyroxine for 2 weeks pending our appointment, to see if she needed to change the dose.  I strongly advised her not to do this anymore, since she needs to take the levothyroxine every single day. She is now taking it daily. - she continues on Synthroid d.a.w. 100 mcg daily - pt feels good on this dose but has increased perspiration and heat intolerance - we discussed about taking the thyroid hormone every day, with water, >30 minutes before breakfast, separated by >4 hours from acid reflux medications, calcium, iron, multivitamins. Pt. is taking it correctly. - will check thyroid tests in 1.5 mo: TSH and fT4 - I will then see her back in 6 mo  2. PTC -in remission -Reviewed records available from Dr. Celine Ahr -Reviewed latest thyroglobulin + ATA: Thyroglobulin is at the lower limits of normal, but detectable, ATA antibodies are undetectable -She does not complain of any neck pressure, and do not feel neck masses on palpation -At this visit, we will repeat her thyroglobulin + ATA we discussed today we will need a neck ultrasound if her thyroglobulin increases.  Orders Placed This Encounter  Procedures  . TSH  . T4, free  . Thyroglobulin Level  . Thyroglobulin antibody   - time spent with the patient: 25 minutes, of which >50% was spent in obtaining information about her symptoms, reviewing her previous labs, evaluations, and treatments, counseling her about her conditions (please see the discussed topics above), and developing a plan to further investigate and treat them; she had  a number of questions which I addressed.  Philemon Kingdom, MD PhD [facility] Endocrinology

## 2018-12-10 NOTE — Patient Instructions (Signed)
Please decrease Synthroid to 88 mcg daily.  Take the thyroid hormone every day, with water, at least 30 minutes before breakfast, separated by at least 4 hours from: - acid reflux medications - calcium - iron - multivitamins  Please come back in 1.5 months for labs.  Please come back for a follow-up appointment in 6 months.

## 2018-12-11 ENCOUNTER — Other Ambulatory Visit: Payer: Self-pay

## 2018-12-11 DIAGNOSIS — I6523 Occlusion and stenosis of bilateral carotid arteries: Secondary | ICD-10-CM

## 2018-12-19 ENCOUNTER — Encounter (HOSPITAL_COMMUNITY): Payer: Medicare Other

## 2018-12-27 ENCOUNTER — Other Ambulatory Visit: Payer: Self-pay

## 2018-12-27 ENCOUNTER — Telehealth: Payer: Self-pay | Admitting: Internal Medicine

## 2018-12-27 ENCOUNTER — Ambulatory Visit (INDEPENDENT_AMBULATORY_CARE_PROVIDER_SITE_OTHER)
Admission: RE | Admit: 2018-12-27 | Discharge: 2018-12-27 | Disposition: A | Discharge status: Home / Self Care | Payer: Self-pay | Source: Ambulatory Visit | Attending: Internal Medicine | Admitting: Internal Medicine

## 2018-12-27 DIAGNOSIS — E785 Hyperlipidemia, unspecified: Secondary | ICD-10-CM

## 2018-12-27 NOTE — Telephone Encounter (Signed)
[facility] called to make office aware of CT Cardiac Scoring Results that are in the chart.  FYI.

## 2018-12-30 NOTE — Telephone Encounter (Signed)
Noted thanks °

## 2019-01-06 ENCOUNTER — Other Ambulatory Visit (HOSPITAL_COMMUNITY): Payer: Self-pay | Admitting: Internal Medicine

## 2019-01-06 ENCOUNTER — Other Ambulatory Visit: Payer: Self-pay

## 2019-01-06 ENCOUNTER — Ambulatory Visit (HOSPITAL_COMMUNITY)
Admission: RE | Admit: 2019-01-06 | Discharge: 2019-01-06 | Disposition: A | Discharge status: Home / Self Care | Payer: Medicare Other | Source: Ambulatory Visit | Attending: Cardiology | Admitting: Cardiology

## 2019-01-06 DIAGNOSIS — I6523 Occlusion and stenosis of bilateral carotid arteries: Secondary | ICD-10-CM | POA: Diagnosis not present

## 2019-01-07 ENCOUNTER — Encounter: Payer: Self-pay | Admitting: Internal Medicine

## 2019-01-13 ENCOUNTER — Other Ambulatory Visit: Payer: Self-pay

## 2019-01-13 ENCOUNTER — Encounter: Payer: Self-pay | Admitting: Internal Medicine

## 2019-01-13 ENCOUNTER — Ambulatory Visit (INDEPENDENT_AMBULATORY_CARE_PROVIDER_SITE_OTHER): Payer: Medicare Other | Admitting: Internal Medicine

## 2019-01-13 DIAGNOSIS — R918 Other nonspecific abnormal finding of lung field: Secondary | ICD-10-CM | POA: Diagnosis not present

## 2019-01-13 NOTE — Assessment & Plan Note (Signed)
chest CT with 5 mm RIGHT lobe pulmonary nodule. Recommend follow-up CT without contrast in 12 months per Fleischner criteria.  The pt is very anxious - can't wait for 12 mo  Repeat CT in 6 months

## 2019-01-13 NOTE — Progress Notes (Signed)
Subjective:  Patient ID: Lauren Mcgee, female    DOB: August 22, 1940  Age: 78 y.o. MRN: [id number]  CC: No chief complaint on file.   HPI Lauren Mcgee presents for chest CT results discussion  Outpatient Medications Prior to Visit  Medication Sig Dispense Refill   aspirin 81 MG chewable tablet Chew 81 mg by mouth daily.       atenolol (TENORMIN) 25 MG tablet TAKE 1 TABLET BY MOUTH EVERY DAY 90 tablet 3   cholecalciferol (VITAMIN D) 1000 UNITS tablet Take 1,000 Units by mouth daily.       Cod Liver Oil 1000 MG CAPS Take 2 capsules by mouth daily.     DENTA 5000 PLUS 1.1 % CREA dental cream      escitalopram (LEXAPRO) 10 MG tablet Take 1 tablet (10 mg total) by mouth daily. 30 tablet 11   MAGNESIUM PO Take 250 mg by mouth daily.     Omega-3 Fatty Acids (FISH OIL) 1000 MG CAPS Take 1-2 capsules by mouth daily.     omeprazole (PRILOSEC OTC) 20 MG tablet Take 20 mg by mouth daily.     pravastatin (PRAVACHOL) 40 MG tablet Take 40 mg by mouth daily.     saccharomyces boulardii (FLORASTOR) 250 MG capsule Take 1 capsule (250 mg total) by mouth daily as needed.     SYNTHROID 88 MCG tablet Take 1 tablet (88 mcg total) by mouth daily before breakfast. 45 tablet 3   No facility-administered medications prior to visit.     ROS: Review of Systems  Constitutional: Positive for fatigue and unexpected weight change.  Respiratory: Positive for cough and shortness of breath.     Objective:  BP 130/74 (BP Location: Right Arm, Patient Position: Sitting, Cuff Size: Normal)    Pulse 71    Temp 97.8 F (36.6 C) (Oral)    Ht 5' 5.5" (1.664 m)    Wt 162 lb (73.5 kg)    SpO2 96%    BMI 26.55 kg/m   BP Readings from Last 3 Encounters:  01/13/19 130/74  12/10/18 138/64  11/19/18 136/70    Wt Readings from Last 3 Encounters:  01/13/19 162 lb (73.5 kg)  12/10/18 159 lb 3.2 oz (72.2 kg)  11/19/18 156 lb (70.8 kg)    Physical Exam Constitutional:      Appearance: Normal appearance.      Lab Results  Component Value Date   WBC 6.5 11/22/2018   HGB 14.4 11/22/2018   HCT 42.1 11/22/2018   PLT 157.0 11/22/2018   GLUCOSE 97 11/22/2018   CHOL 173 11/22/2018   TRIG 163.0 (H) 11/22/2018   HDL 43.20 11/22/2018   LDLDIRECT 158.1 11/24/2008   LDLCALC 97 11/22/2018   ALT 22 11/22/2018   AST 25 11/22/2018   NA 140 11/22/2018   K 4.0 11/22/2018   CL 106 11/22/2018   CREATININE 0.83 11/22/2018   BUN 8 11/22/2018   CO2 25 11/22/2018   TSH 0.09 (L) 11/22/2018   HGBA1C 5.5 05/05/2015    Vas US Carotid  Result Date: 01/06/2019 Carotid Arterial Duplex Study Indications:       Carotid artery disease and follow up. Patient denies any                    cerebrovascular symptoms today. Risk Factors:      Hypertension, hyperlipidemia, past history of smoking. Comparison Study:  Prior carotid duplex exam 02/15/2012 showed velocities in  right proximal ICA 138/29 cm/s and left mid ICA 103/25 cm/s. Performing Technologist: Salvadore Dom RVT, RDCS (AE), RDMS  Examination Guidelines: A complete evaluation includes B-mode imaging, spectral Doppler, color Doppler, and power Doppler as needed of all accessible portions of each vessel. Bilateral testing is considered an integral part of a complete examination. Limited examinations for reoccurring indications may be performed as noted.  Right Carotid Findings: +----------+-------+-------+--------+---------------------------------+--------+             PSV     EDV     Stenosis Plaque Description                Comments              cm/s    cm/s                                                         +----------+-------+-------+--------+---------------------------------+--------+  CCA Prox   58      6                                                            +----------+-------+-------+--------+---------------------------------+--------+  CCA Distal 64      14               heterogenous and calcific                    +----------+-------+-------+--------+---------------------------------+--------+  ICA Prox   106     25      1-39%    heterogenous, calcific and                                                       irregular                                   +----------+-------+-------+--------+---------------------------------+--------+  ICA Mid    101     21                                                           +----------+-------+-------+--------+---------------------------------+--------+  ICA Distal 78      23                                                           +----------+-------+-------+--------+---------------------------------+--------+  ECA        250     22      >50%     heterogenous, irregular and  calcific                                    +----------+-------+-------+--------+---------------------------------+--------+ +----------+--------+-------+----------------+-------------------+             PSV cm/s EDV cms Describe         Arm Pressure (mmHG)  +----------+--------+-------+----------------+-------------------+  Subclavian 190              Multiphasic, WNL 130                  +----------+--------+-------+----------------+-------------------+ +---------+--------+--+--------+-+---------+  Vertebral PSV cm/s 34 EDV cm/s 9 Antegrade  +---------+--------+--+--------+-+---------+ The ICA velocities have decreased since prior exam. Left Carotid Findings: +----------+--------+--------+--------+---------------------+------------------+             PSV cm/s EDV cm/s Stenosis Plaque Description    Comments            +----------+--------+--------+--------+---------------------+------------------+  CCA Prox   78       15                                                          +----------+--------+--------+--------+---------------------+------------------+  CCA Distal 141      25                heterogenous,         Shadowing                                                   irregular and                                                                    calcific                                  +----------+--------+--------+--------+---------------------+------------------+  ICA Prox   157      38       40-59%   heterogenous,         low end range                                              irregular and         based on peak                                              calcific              systolic velocity   +----------+--------+--------+--------+---------------------+------------------+  ICA Mid    149      34                                                          +----------+--------+--------+--------+---------------------+------------------+  ICA Distal 76       25                                                          +----------+--------+--------+--------+---------------------+------------------+  ECA        217      27                heterogenous,                                                                    irregular and                                                                    calcific                                  +----------+--------+--------+--------+---------------------+------------------+ +----------+--------+--------+----------------+-------------------+             PSV cm/s EDV cm/s Describe         Arm Pressure (mmHG)  +----------+--------+--------+----------------+-------------------+  Subclavian 177               Multiphasic, WNL 130                  +----------+--------+--------+----------------+-------------------+ +---------+--------+--+--------+--+---------+  Vertebral PSV cm/s 44 EDV cm/s 11 Antegrade  +---------+--------+--+--------+--+---------+ The distal CCA and ICA velocities have increased since prior exam. Summary: Right Carotid: Velocities in the right ICA are consistent with a 1-39% stenosis.                The ECA appears >50% stenosed. Left Carotid: Non-hemodynamically significant plaque <50% noted in the CCA.                Velocities in the left ICA are consistent with low end range               40-59%. Vertebrals:  Bilateral vertebral arteries demonstrate antegrade flow. Subclavians: Normal flow hemodynamics were seen in bilateral subclavian              arteries. *See table(s) above for measurements and observations. Suggest follow up study in 12 months. Electronically signed by Jenkins Rouge MD on 01/06/2019 at 3:33:38 PM.    Final     Assessment & Plan:   Diagnoses and all orders for this visit:  Abnormal findings on diagnostic imaging of lung -     CT CHEST NODULE FOLLOW UP LOW DOSE W/O; Future     No orders of the defined types were placed in this encounter.    Follow-up: No follow-ups on file.  Walker Kehr, MD

## 2019-01-23 ENCOUNTER — Other Ambulatory Visit (INDEPENDENT_AMBULATORY_CARE_PROVIDER_SITE_OTHER): Payer: Medicare Other

## 2019-01-23 DIAGNOSIS — E890 Postprocedural hypothyroidism: Secondary | ICD-10-CM

## 2019-01-23 DIAGNOSIS — Z85850 Personal history of malignant neoplasm of thyroid: Secondary | ICD-10-CM

## 2019-01-23 LAB — T4, FREE: Free T4: 1.09 ng/dL (ref 0.60–1.60)

## 2019-01-23 LAB — TSH: TSH: 0.18 u[IU]/mL — ABNORMAL LOW (ref 0.35–4.50)

## 2019-01-24 ENCOUNTER — Telehealth: Payer: Self-pay

## 2019-01-24 DIAGNOSIS — Z85850 Personal history of malignant neoplasm of thyroid: Secondary | ICD-10-CM

## 2019-01-24 DIAGNOSIS — E890 Postprocedural hypothyroidism: Secondary | ICD-10-CM

## 2019-01-24 LAB — THYROGLOBULIN ANTIBODY: Thyroglobulin Ab: <1 IU/mL (ref <=1)

## 2019-01-24 LAB — THYROGLOBULIN LEVEL: Thyroglobulin: <0.1 ng/mL — ABNORMAL LOW

## 2019-01-24 NOTE — Telephone Encounter (Signed)
-----   Message from Philemon Kingdom, MD sent at 01/24/2019  2:48 PM EST ----- Lenna Sciara, can you please call pt: The thyroglobulin (thyroid cancer marker) is excellent, undetectable, as are her antithyroglobulin antibodies.  The TSH has improved but is still lower than normal.  However, due to previous variability in the test, I would like to wait another 1.5 months to recheck her TSH and free T4 and then decide whether she needs another change in dose.  Can you please order these 2 tests?

## 2019-01-27 NOTE — Telephone Encounter (Signed)
Notified patient of message from Dr. Cruzita Lederer, patient expressed understanding and agreement. No further questions.  Lab appt made and ordered placed.

## 2019-03-05 ENCOUNTER — Other Ambulatory Visit: Payer: Medicare Other

## 2019-03-12 ENCOUNTER — Other Ambulatory Visit (INDEPENDENT_AMBULATORY_CARE_PROVIDER_SITE_OTHER): Payer: Medicare Other

## 2019-03-12 DIAGNOSIS — Z85850 Personal history of malignant neoplasm of thyroid: Secondary | ICD-10-CM

## 2019-03-12 DIAGNOSIS — E890 Postprocedural hypothyroidism: Secondary | ICD-10-CM

## 2019-03-12 LAB — TSH: TSH: 0.3 u[IU]/mL — ABNORMAL LOW (ref 0.35–4.50)

## 2019-03-12 LAB — T4, FREE: Free T4: 1.08 ng/dL (ref 0.60–1.60)

## 2019-03-14 ENCOUNTER — Telehealth: Payer: Self-pay

## 2019-03-14 DIAGNOSIS — E890 Postprocedural hypothyroidism: Secondary | ICD-10-CM

## 2019-03-14 DIAGNOSIS — Z85850 Personal history of malignant neoplasm of thyroid: Secondary | ICD-10-CM

## 2019-03-14 NOTE — Telephone Encounter (Signed)
-----   Message from Philemon Kingdom, MD sent at 03/13/2019 10:08 AM EST ----- Lenna Sciara, can you please call pt:  TSH is much better, but still slightly low so I would like to decrease the dose of her l Synthroid d.a.w. from 88 to 75 mcg daily.  Can you please send a 45-day supply of this dose to her pharmacy with 3 refills? Also, Let's order a TSH and a free T4 for 1.5 months from now.

## 2019-03-18 MED ORDER — SYNTHROID 75 MCG PO TABS: 75.0000 ug | ORAL_TABLET | Freq: Every day | ORAL | 3 refills | Status: DC

## 2019-03-18 NOTE — Telephone Encounter (Signed)
Noted  

## 2019-03-18 NOTE — Telephone Encounter (Signed)
Spoke to patient's husband and gave the message below, he states that a week ago a doctor told them she needs to take 100 mcg and has been doing so for the last week. He does not recall the name of the doctor and I do not see it in her chart.  They will pick up the 75 dose today.

## 2019-04-14 NOTE — Addendum Note (Signed)
Addended by: Cardell Peach I on: 04/14/2019 01:22 PM   Modules accepted: Orders

## 2019-04-14 NOTE — Telephone Encounter (Signed)
Orders placed.

## 2019-04-14 NOTE — Telephone Encounter (Signed)
Pt has set up her appt for labs for tomorrow. I did not see current labs in yet.

## 2019-04-15 ENCOUNTER — Other Ambulatory Visit (INDEPENDENT_AMBULATORY_CARE_PROVIDER_SITE_OTHER): Payer: Medicare Other

## 2019-04-15 ENCOUNTER — Other Ambulatory Visit: Payer: Self-pay

## 2019-04-15 DIAGNOSIS — Z85850 Personal history of malignant neoplasm of thyroid: Secondary | ICD-10-CM | POA: Diagnosis not present

## 2019-04-15 DIAGNOSIS — E890 Postprocedural hypothyroidism: Secondary | ICD-10-CM

## 2019-04-15 LAB — TSH: TSH: 0.57 u[IU]/mL (ref 0.35–4.50)

## 2019-04-15 LAB — T4, FREE: Free T4: 0.94 ng/dL (ref 0.60–1.60)

## 2019-04-19 ENCOUNTER — Other Ambulatory Visit: Payer: Self-pay | Admitting: Internal Medicine

## 2019-05-22 DIAGNOSIS — N390 Urinary tract infection, site not specified: Secondary | ICD-10-CM | POA: Diagnosis not present

## 2019-05-28 ENCOUNTER — Other Ambulatory Visit: Payer: Self-pay | Admitting: Internal Medicine

## 2019-06-10 ENCOUNTER — Other Ambulatory Visit: Payer: Self-pay

## 2019-06-12 ENCOUNTER — Ambulatory Visit (INDEPENDENT_AMBULATORY_CARE_PROVIDER_SITE_OTHER): Payer: Medicare Other | Admitting: Internal Medicine

## 2019-06-12 ENCOUNTER — Other Ambulatory Visit: Payer: Self-pay

## 2019-06-12 ENCOUNTER — Encounter: Payer: Self-pay | Admitting: Internal Medicine

## 2019-06-12 VITALS — BP 140/72 | HR 61 | Ht 65.5 in | Wt 160.5 lb

## 2019-06-12 DIAGNOSIS — Z85850 Personal history of malignant neoplasm of thyroid: Secondary | ICD-10-CM | POA: Diagnosis not present

## 2019-06-12 DIAGNOSIS — E890 Postprocedural hypothyroidism: Secondary | ICD-10-CM

## 2019-06-12 LAB — TSH: TSH: 1.83 u[IU]/mL (ref 0.35–4.50)

## 2019-06-12 LAB — T4, FREE: Free T4: 0.75 ng/dL (ref 0.60–1.60)

## 2019-06-12 MED ORDER — SYNTHROID 75 MCG PO TABS: 75.0000 ug | ORAL_TABLET | Freq: Every day | ORAL | 3 refills | Status: DC

## 2019-06-12 NOTE — Patient Instructions (Signed)
Please continue Synthroid 75 mcg daily.  Take the thyroid hormone every day, with water, at least 30 minutes before breakfast, separated by at least 4 hours from: - acid reflux medications - calcium - iron - multivitamins  Please stop at the lab.  Please come back for a follow-up appointment in 6 months.  

## 2019-06-12 NOTE — Progress Notes (Signed)
Patient ID: Lauren Mcgee, female   DOB: 1940/12/20, 79 y.o.   MRN: [id number]  This visit occurred during the SARS-CoV-2 public health emergency.  Safety protocols were in place, including screening questions prior to the visit, additional usage of staff PPE, and extensive cleaning of exam room while observing appropriate contact time as indicated for disinfecting solutions.   HPI  Lauren Mcgee is a 79 y.o.-year-old female, returning for f/u for Thyroid Cancer in remission, and postsurgical hypothyroidism. Last visit 6 mo ago.  Reviewed and addended history: - Diagnosed thyroid cancer in 2010.  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 No nodes sampled. One of the papillary tumors was approaching and may have involved the surgical resection margin. There was minimal extra thyroid though invasion of one of the papillary microcarcinoma's, but no lymphovascular invasion.  - RAI tx with 154 mCi I-131 on 03/16/2010. At that time TSH was>100, thyroglobulin was 0.7, ATA<20  - Post treatment WBS did not show metastatic disease. There were foci of activity which were previously seen on the pretherapy scan. No new lesions identified.  Neck U/S 12/09/2012: Post thyroidectomy. No evidence of residual thyroid tissue or mass/nodule at the thyroid bed.  Reviewed her thyroglobulin and antithyroglobulin antibody levels: Lab Results  Component Value Date   THYROGLB <0.1 (L) 01/23/2019   THYROGLB 0.1 (L) 01/11/2018   THYROGLB 0.1 (L) 12/06/2016   THYROGLB <0.1 (L) 10/12/2015   THYROGLB <0.1 (L) 10/12/2014   THYROGLB <0.2 06/24/2013   THYROGLB <0.2 12/02/2012   THGAB <1 01/23/2019   THGAB <1 01/11/2018   THGAB <1 12/06/2016   THGAB <1 10/12/2015   THGAB <1 10/12/2014   THGAB <20.0 06/24/2013   THGAB <20.0 12/02/2012    Postsurgical hypothyroidism:  Pt is on Synthroid 75 (decreased his last visit, taken: - in am - fasting - 60 min from b'fast - no Ca, Fe, MVI, + rarely PPIs - later in the day - not on Biotin  Reviewed her TFTs: Lab Results  Component Value Date   TSH 0.57 04/15/2019   TSH 0.30 (L) 03/12/2019   TSH 0.18 (L) 01/23/2019   TSH 0.09 (L) 11/22/2018   TSH 0.88 04/03/2018   TSH 9.04 (H) 02/18/2018   TSH 13.40 (H) 01/11/2018   TSH 0.88 12/06/2016   TSH 0.72 08/28/2016   TSH 0.78 10/12/2015   FREET4 0.94 04/15/2019   FREET4 1.08 03/12/2019   FREET4 1.09 01/23/2019   FREET4 1.05 12/06/2018   FREET4 1.14 04/03/2018   FREET4 0.69 02/18/2018   FREET4 0.67 01/11/2018   FREET4 1.02 12/06/2016   FREET4 0.93 08/28/2016   FREET4 0.89 10/12/2015   Pt denies: - feeling nodules in neck - hoarseness - choking - SOB with lying down But has chronic dysphagia, present even before thyroid surgery.  She continues on atenolol per cardiology.  She also has a history of HTN, HL (Crestor >> fatigue), PVD, GERD.   Her son had COVID-19 infection in 02/2019.  ROS: Constitutional: no weight gain/no weight loss, no fatigue, + subjective hyperthermia, no subjective hypothermia Eyes: no blurry vision, no xerophthalmia ENT: no sore throat, + see HPI Cardiovascular: no CP/no SOB/no palpitations/no leg swelling Respiratory: no cough/no SOB/no wheezing Gastrointestinal: no N/no V/no D/no C/no acid reflux Musculoskeletal: no muscle aches/no joint aches Skin: no rashes, no hair loss Neurological: no tremors/no numbness/no tingling/no  dizziness  I reviewed pt's medications, allergies, PMH, social hx, family hx, and changes were documented in the history of present illness. Otherwise, unchanged from my initial visit note.  Past Medical History:  Diagnosis Date  . Diverticulosis of colon (without mention of hemorrhage)   . GERD (gastroesophageal reflux disease)   . Hx of colonic polyp 2001    TUBULAR ADENOMA  . Hyperlipidemia   . Hypertension   . Hypothyroidism   . IBS (irritable bowel syndrome)   . PVD (peripheral vascular disease) ([place])   . Thyroid cancer ([place]) 2011   Past Surgical History:  Procedure Laterality Date  . ABDOMINAL HYSTERECTOMY  1991  . COLONOSCOPY  2010   562.10  . ESOPHAGOGASTRODUODENOSCOPY     multiple  . THYROIDECTOMY  2010   and 131I treatment at [facility]- Dr. Celine Ahr   Social History   Socioeconomic History  . Marital status: Married    Spouse name: Not on file  . Number of children: 2  . Years of education: Not on file  . Highest education level: Not on file  Occupational History  . Occupation: retired  Tobacco Use  . Smoking status: Former Smoker    Packs/day: 0.50    Types: Cigarettes    Quit date: 03/06/1958    Years since quitting: 61.3  . Smokeless tobacco: Never Used  Substance and Sexual Activity  . Alcohol use: No  . Drug use: No  . Sexual activity: Yes  Other Topics Concern  . Not on file  Social History Narrative   Retired   Married 2 kids   Regular exercise: not outside the home    Caffeine use: coffee daily   Former smoker, no alcohol, no drugs   Social Determinants of Radio broadcast assistant Strain:   . Difficulty of Paying Living Expenses:   Food Insecurity:   . Worried About Charity fundraiser in the Last Year:   . Arboriculturist in the Last Year:   Transportation Needs:   . Film/video editor (Medical):   Marland Kitchen Lack of Transportation (Non-Medical):   Physical Activity:   . Days of Exercise per Week:   . Minutes of Exercise per Session:   Stress:   . Feeling of Stress :   Social Connections:   . Frequency of Communication with Friends and Family:   . Frequency of Social Gatherings with Friends and Family:   . Attends Religious Services:   . Active Member of Clubs or Organizations:   . Attends Archivist Meetings:   Marland Kitchen Marital Status:   Intimate Partner Violence:   . Fear of Current or  Ex-Partner:   . Emotionally Abused:   Marland Kitchen Physically Abused:   . Sexually Abused:    Current Outpatient Medications on File Prior to Visit  Medication Sig Dispense Refill  . aspirin 81 MG chewable tablet Chew 81 mg by mouth daily.      Marland Kitchen atenolol (TENORMIN) 25 MG tablet TAKE 1 TABLET BY MOUTH EVERY DAY 90 tablet 3  . cholecalciferol (VITAMIN D) 1000 UNITS tablet Take 1,000 Units by mouth daily.      Marland Kitchen Cod Liver Oil 1000 MG CAPS Take 2 capsules by mouth daily.    . DENTA 5000 PLUS 1.1 % CREA dental cream     . escitalopram (LEXAPRO) 10 MG tablet Take 1 tablet (10 mg total) by mouth daily. 30 tablet 11  . MAGNESIUM PO Take 250 mg by mouth daily.    Marland Kitchen  Omega-3 Fatty Acids (FISH OIL) 1000 MG CAPS Take 1-2 capsules by mouth daily.    Marland Kitchen omeprazole (PRILOSEC OTC) 20 MG tablet Take 20 mg by mouth daily.    . pravastatin (PRAVACHOL) 40 MG tablet Take 40 mg by mouth daily.    Marland Kitchen saccharomyces boulardii (FLORASTOR) 250 MG capsule Take 1 capsule (250 mg total) by mouth daily as needed.    Marland Kitchen SYNTHROID 75 MCG tablet Take 1 tablet (75 mcg total) by mouth daily before breakfast. 45 tablet 3   No current facility-administered medications on file prior to visit.   Allergies  Allergen Reactions  . Influenza Vaccines     Local reaction   Family History  Problem Relation Age of Onset  . Hyperlipidemia Mother   . Heart disease Mother   . Hypertension Other   . Colon cancer Paternal Aunt 78  . Colon cancer Son 12  . Colon polyps Neg Hx   . Liver cancer Neg Hx   . Pancreatic cancer Neg Hx   . Rectal cancer Neg Hx   . Stomach cancer Neg Hx     PE: BP 140/72 (BP Location: Left Arm, Patient Position: Sitting, Cuff Size: Normal)   Pulse 61   Ht 5' 5.5" (1.664 m)   Wt 160 lb 8 oz (72.8 kg)   SpO2 95%   BMI 26.30 kg/m  Body mass index is 26.3 kg/m. Wt Readings from Last 3 Encounters:  06/12/19 160 lb 8 oz (72.8 kg)  01/13/19 162 lb (73.5 kg)  12/10/18 159 lb 3.2 oz (72.2 kg)   Constitutional:  overweight, in NAD Eyes: PERRLA, EOMI, no exophthalmos ENT: moist mucous membranes, no neck masses, no cervical lymphadenopathy Cardiovascular: RRR, No MRG Respiratory: CTA B Gastrointestinal: abdomen soft, NT, ND, BS+ Musculoskeletal: no deformities, strength intact in all 4 Skin: moist, warm, no rashes Neurological: no tremor with outstretched hands, DTR normal in all 4  ASSESSMENT: 1. Hypothyroidism  2. History of thyroid cancer  PLAN:  1. Patient with longstanding, uncontrolled, past surgical hypothyroidism, on levothyroxine therapy - She is currently on Synthroid d.a.w. 75 mcg daily decreased in 03/2019 - At last visit, she had increased perspiration and heat intolerance.  TSH was suppressed so we backed off the Synthroid dose - latest thyroid labs reviewed with pt >> normal: Lab Results  Component Value Date   TSH 0.57 04/15/2019   - she continues on LT4 75 mcg daily - pt feels good on this dose, but does complain of feeling more hot whenever he does her chores. - we discussed about taking the thyroid hormone every day, with water, >30 minutes before breakfast, separated by >4 hours from acid reflux medications, calcium, iron, multivitamins. Pt. is taking it correctly. - will check thyroid tests today: TSH and fT4 - If labs are abnormal, she will need to return for repeat TFTs in 1.5 months - RTC in 6 mo  2. PTC -In remission -Reviewed records available from Dr. Celine Ahr -Reviewed thyroglobulin + ATA levels: Both undetectable. -No neck compression symptoms and I do not feel any masses in her neck on palpation. -At this visit, we discussed about possibly obtaining another neck ultrasound >> at next OV  Needs refills 90 days.  Office Visit on 06/12/2019  Component Date Value Ref Range Status  . TSH 06/12/2019 1.83  0.35 - 4.50 uIU/mL Final  . Free T4 06/12/2019 0.75  0.60 - 1.60 ng/dL Final   Comment: Specimens from patients who are undergoing biotin therapy and /or  ingesting biotin supplements may contain high levels of biotin.  The higher biotin concentration in these specimens interferes with this Free T4 assay.  Specimens that contain high levels  of biotin may cause false high results for this Free T4 assay.  Please interpret results in light of the total clinical presentation of the patient.     Normal labs.  Philemon Kingdom, MD PhD [facility] Endocrinology

## 2019-06-25 DIAGNOSIS — N390 Urinary tract infection, site not specified: Secondary | ICD-10-CM | POA: Diagnosis not present

## 2019-06-27 ENCOUNTER — Ambulatory Visit (INDEPENDENT_AMBULATORY_CARE_PROVIDER_SITE_OTHER)
Admission: RE | Admit: 2019-06-27 | Discharge: 2019-06-27 | Disposition: A | Discharge status: Home / Self Care | Payer: Medicare Other | Source: Ambulatory Visit | Attending: Internal Medicine | Admitting: Internal Medicine

## 2019-06-27 ENCOUNTER — Other Ambulatory Visit: Payer: Self-pay

## 2019-06-27 DIAGNOSIS — R911 Solitary pulmonary nodule: Secondary | ICD-10-CM | POA: Diagnosis not present

## 2019-06-27 DIAGNOSIS — R918 Other nonspecific abnormal finding of lung field: Secondary | ICD-10-CM

## 2019-06-30 ENCOUNTER — Other Ambulatory Visit: Payer: Self-pay | Admitting: Internal Medicine

## 2019-06-30 DIAGNOSIS — R918 Other nonspecific abnormal finding of lung field: Secondary | ICD-10-CM

## 2019-07-01 DIAGNOSIS — R309 Painful micturition, unspecified: Secondary | ICD-10-CM | POA: Diagnosis not present

## 2019-07-01 DIAGNOSIS — N952 Postmenopausal atrophic vaginitis: Secondary | ICD-10-CM | POA: Diagnosis not present

## 2019-07-01 DIAGNOSIS — N8110 Cystocele, unspecified: Secondary | ICD-10-CM | POA: Diagnosis not present

## 2019-07-02 DIAGNOSIS — N8110 Cystocele, unspecified: Secondary | ICD-10-CM | POA: Diagnosis not present

## 2019-07-04 ENCOUNTER — Telehealth: Payer: Self-pay | Admitting: Internal Medicine

## 2019-07-04 NOTE — Telephone Encounter (Signed)
New message:   Pt is calling and states she would like a call back to go over her test results. Today if possible. Please advise.

## 2019-07-04 NOTE — Telephone Encounter (Signed)
Pt renotified of CT results

## 2019-07-07 ENCOUNTER — Other Ambulatory Visit: Payer: Self-pay | Admitting: Internal Medicine

## 2019-07-07 DIAGNOSIS — R918 Other nonspecific abnormal finding of lung field: Secondary | ICD-10-CM

## 2019-07-15 ENCOUNTER — Other Ambulatory Visit: Payer: Self-pay | Admitting: Internal Medicine

## 2019-07-22 DIAGNOSIS — N8110 Cystocele, unspecified: Secondary | ICD-10-CM | POA: Diagnosis not present

## 2019-07-22 DIAGNOSIS — N390 Urinary tract infection, site not specified: Secondary | ICD-10-CM | POA: Diagnosis not present

## 2019-07-24 DIAGNOSIS — E7800 Pure hypercholesterolemia, unspecified: Secondary | ICD-10-CM | POA: Diagnosis not present

## 2019-07-24 DIAGNOSIS — I10 Essential (primary) hypertension: Secondary | ICD-10-CM | POA: Diagnosis not present

## 2019-07-24 DIAGNOSIS — E039 Hypothyroidism, unspecified: Secondary | ICD-10-CM | POA: Diagnosis not present

## 2019-07-24 DIAGNOSIS — Z0000 Encounter for general adult medical examination without abnormal findings: Secondary | ICD-10-CM | POA: Diagnosis not present

## 2019-07-24 DIAGNOSIS — Z1389 Encounter for screening for other disorder: Secondary | ICD-10-CM | POA: Diagnosis not present

## 2019-08-05 ENCOUNTER — Ambulatory Visit: Payer: Medicare Other | Admitting: Internal Medicine

## 2019-08-05 ENCOUNTER — Institutional Professional Consult (permissible substitution): Payer: Medicare Other | Admitting: Internal Medicine

## 2019-08-05 ENCOUNTER — Other Ambulatory Visit: Payer: Self-pay

## 2019-08-05 ENCOUNTER — Encounter: Payer: Self-pay | Admitting: Internal Medicine

## 2019-08-05 VITALS — BP 126/72 | HR 72 | Temp 98.4°F | Ht 65.5 in | Wt 160.4 lb

## 2019-08-05 DIAGNOSIS — R911 Solitary pulmonary nodule: Secondary | ICD-10-CM | POA: Diagnosis not present

## 2019-08-05 NOTE — Progress Notes (Signed)
Lauren Mcgee    FI:[id number]    Jan 04, 1941  Primary Care Physician:Plotnikov, Evie Lacks, MD  Referring Physician: Cassandria Anger, MD [address] [place],   [address] Reason for Consultation: pulmonary nodule Date of Consultation: 08/05/2019  Chief complaint:   Chief Complaint  Patient presents with  . Consult    Pt being refererred by PCP. Denys any SOB or cough     HPI: Lauren Mcgee is a 79 y.o. woman who presents for new patient evaluation of solitary pulmonary nodule. She had a CT cardiac scoring study in October 2020 which demonstrated a 6.12mm RLL nodule.  On repeat CT Chest 6 months later this was documented as stable and there was noted a 23mm ground glass nodule in the RUL. She presents today at the request of Dr. Alain Marion for nodule.    No fevers, chills, night sweats or weight loss. Here with her husband. They live in [place], [place]. They are fairly active for their age, and have been married for almost 28 years. No history of recurrent sinopulmonary infections. Had bronchitis over 2 years ago.   She is a former smoker - very early 71s. Less than 5 pack years.  Her son had cancer in the colon at age 40. Had colectomy. Doing well in remission now.   Otherwise she has no personal history of malignancy. Was up to date on all age appropriate cancer screening.    Social History   Occupational History  . Occupation: retired  Tobacco Use  . Smoking status: Former Smoker    Packs/day: 0.50    Types: Cigarettes    Quit date: 03/06/1958    Years since quitting: 61.4  . Smokeless tobacco: Never Used  Substance and Sexual Activity  . Alcohol use: No  . Drug use: No  . Sexual activity: Yes    Relevant family history:  Family History  Problem Relation Age of Onset  . Hyperlipidemia Mother   . Heart disease Mother   . Hypertension Other   . Colon cancer Paternal Aunt 78  . Colon cancer Son 26  . Colon polyps Neg Hx   . Liver cancer Neg Hx   .  Pancreatic cancer Neg Hx   . Rectal cancer Neg Hx   . Stomach cancer Neg Hx     Past Medical History:  Diagnosis Date  . Diverticulosis of colon (without mention of hemorrhage)   . GERD (gastroesophageal reflux disease)   . Hx of colonic polyp 2001   TUBULAR ADENOMA  . Hyperlipidemia   . Hypertension   . Hypothyroidism   . IBS (irritable bowel syndrome)   . PVD (peripheral vascular disease) ([place])   . Thyroid cancer ([place]) 2011    Past Surgical History:  Procedure Laterality Date  . ABDOMINAL HYSTERECTOMY  1991  . COLONOSCOPY  2010   562.10  . ESOPHAGOGASTRODUODENOSCOPY     multiple  . THYROIDECTOMY  2010   and 131I treatment at [facility]- Dr. Celine Ahr    Physical Exam: Blood pressure 126/72, pulse 72, temperature 98.4 F (36.9 C), temperature source Oral, height 5' 5.5" (1.664 m), weight 160 lb 6.4 oz (72.8 kg), SpO2 97 %. Gen:      No acute distress ENT:  no nasal polyps, mucus membranes moist Lungs:    No increased respiratory effort, symmetric chest wall excursion, clear to auscultation bilaterally, no wheezes or crackles CV:         Regular rate  and rhythm; no murmurs, rubs, or gallops.  No pedal edema Abd:      + bowel sounds; soft, non-tender; no distension MSK: no acute synovitis of DIP or PIP joints, no mechanics hands.  Skin:      Warm and dry; no rashes Neuro: normal speech, no focal facial asymmetry Psych: alert and oriented x3, normal mood and affect   Data Reviewed/Medical Decision Making:  Independent interpretation of tests: Imaging: . Review of patient's CT Chest April 2021 images revealed 6.7 mm pleural based RLL nodule (unchanged from previous study in Oct 2020) and RUL ggo 38mm in size. The patient's images have been independently reviewed by me.    Labs:  Lab Results  Component Value Date   WBC 6.5 11/22/2018   HGB 14.4 11/22/2018   HCT 42.1 11/22/2018   MCV 85.3 11/22/2018   PLT 157.0 11/22/2018   Lab Results  Component Value Date   NA 140  11/22/2018   K 4.0 11/22/2018   CL 106 11/22/2018   CO2 25 11/22/2018     Immunization status:  Immunization History  Administered Date(s) Administered  . Fluad Quad(high Dose 65+) 11/19/2018  . Influenza Whole 11/26/2007  . Influenza, High Dose Seasonal PF 12/06/2017  . Influenza,inj,Quad PF,6+ Mos 12/06/2016  . Moderna SARS-COVID-2 Vaccination 03/21/2019, 04/18/2019  . Pneumococcal Conjugate-13 10/08/2013  . Pneumococcal Polysaccharide-23 11/24/2008  . Zoster 11/10/2010    . I reviewed prior external note(s) from PCP . I reviewed the result(s) of the labs and imaging as noted above.  . I have ordered CT Chest   Assessment:  Pulmonary Nodule 6.7 mm with documented 6 month stability 45mm pulmonary ground glass nodule - newly visualized.   Plan/Recommendations: In this low risk patient, the 93mm nodule needs no further follow up.  Based on Fleischner society criteria, I recommend a repeat CT scan in October 2022 for follow up on the 6.50mm nodule. If unchanged will reflect 2 years of stability and needs no further follow up.  If there is a change or interval enlargement, I have asked her to return back to our clinic for further evaluation.  Will communicate these findings with Dr. Alain Marion.  I am happy to see her back as needed pending results of her CT scan in October 2022, or any change in her breathing.  We discussed disease management and progression at length today, and I provided anticipatory guidance on her nodule.   Return to Care: Return if symptoms worsen or fail to improve, for lung nodule. Lenice Llamas, MD Pulmonary and [place]  CC: Plotnikov, Evie Lacks, MD   Fleischner Society Guidelines [address] [place], [place], et al. Guidelines for management of incidental pulmonary nodules detected on CT Images: From the Fleischner Society. Radiology 2017; [id number]. Copyright  2017 Radiological Society of  [place].  Evaluation of the incidental solid pulmonary nodule in adults Nodule size (mm) Low (<5%) cancer risk High (>65%) or moderate (5 to 65%) cancer risk  Solitary  <6 No routine follow-up Optional CT at 12 months  6 to 8 CT at 6 to 12 months, then consider CT at 18 to 24 months CT at 6 to 12 months, then CT at 18 to 24 months  >8 CT at 3 months, then at 9 and 24 months FDG PET/CT, biopsy or resection  Multiple (evaluation based on largest nodule)  <6 No routine follow-up Optional CT at 12 months  ?6 CT at 3  to 6 months, then consider CT at 18 to 24 months CT at 3 to 6 months, then CT at 18 to 24 months    Not applicable to patients age <35 years, in lung cancer screening, with immunosuppression, known pulmonary disease or symptoms or active primary cancer.  Chest CT performed without contrast as contiguous 1 mm sections using low dose technique.  Growing or FDG-avid nodules should undergo biopsy or resection. Growth is defined as >1.5 mm increase.  Nodules unchanged for >2 years are benign.   CT: computed tomography; FDG: 18-fluorodeoxyglucose; PET: positron emission tomography.

## 2019-08-05 NOTE — Patient Instructions (Signed)
Plan to follow up on this lung nodule in October 2022 with another CT scan.  I am happy to see you back as needed if anything changes with your breathing or condition.

## 2019-08-06 DIAGNOSIS — N8111 Cystocele, midline: Secondary | ICD-10-CM | POA: Diagnosis not present

## 2019-08-06 DIAGNOSIS — N390 Urinary tract infection, site not specified: Secondary | ICD-10-CM | POA: Diagnosis not present

## 2019-08-18 ENCOUNTER — Ambulatory Visit: Payer: Medicare Other | Attending: Obstetrics and Gynecology | Admitting: Physical Therapy

## 2019-08-18 ENCOUNTER — Encounter: Payer: Self-pay | Admitting: Physical Therapy

## 2019-08-18 ENCOUNTER — Other Ambulatory Visit: Payer: Self-pay

## 2019-08-18 DIAGNOSIS — R278 Other lack of coordination: Secondary | ICD-10-CM | POA: Insufficient documentation

## 2019-08-18 DIAGNOSIS — M6281 Muscle weakness (generalized): Secondary | ICD-10-CM | POA: Diagnosis not present

## 2019-08-18 NOTE — Patient Instructions (Addendum)
Slow Contraction: Gravity Eliminated (Hook-Lying)    Lie with hips and knees bent. Slowly squeeze pelvic floor for _3__ seconds. Rest for _5__ seconds. Repeat _5__ times. Do __3_ times a day.   Copyright  VHI. All rights reserved.  [facility] [address], [place], [place] [address] Phone # [phone] Fax [phone]

## 2019-08-18 NOTE — Therapy (Signed)
[facility] [address] [place], [place], [address] Phone: [phone]   Fax:  [phone]  Physical Therapy Evaluation  Patient Details  Name: Lauren Mcgee MRN: [id number] Date of Birth: [birth year] Referring Provider (PT): Dr. Dian Queen   Encounter Date: 08/18/2019   PT End of Session - 08/18/19 1207    Visit Number 1    Date for PT Re-Evaluation 10/13/19    Authorization Type UHC medicare    PT Start Time 1937    PT Stop Time 1220    PT Time Calculation (min) 35 min    Activity Tolerance Patient tolerated treatment well;No increased pain    Behavior During Therapy WFL for tasks assessed/performed           Past Medical History:  Diagnosis Date  . Diverticulosis of colon (without mention of hemorrhage)   . GERD (gastroesophageal reflux disease)   . Hx of colonic polyp 2001   TUBULAR ADENOMA  . Hyperlipidemia   . Hypertension   . Hypothyroidism   . IBS (irritable bowel syndrome)   . PVD (peripheral vascular disease) ([place])   . Thyroid cancer ([place]) 2011    Past Surgical History:  Procedure Laterality Date  . ABDOMINAL HYSTERECTOMY  1991  . COLONOSCOPY  2010   562.10  . ESOPHAGOGASTRODUODENOSCOPY     multiple  . THYROIDECTOMY  2010   and 131I treatment at [facility]- Dr. Celine Ahr    There were no vitals filed for this visit.    Subjective Assessment - 08/18/19 1150    Subjective had a bladder tact 10 years ago. Bladder is falling down. Sometimes has a falling out feeling.    Patient Stated Goals pelvic floor strength    Currently in Pain? No/denies              [facility] PT Assessment - 08/18/19 0001      Assessment   Medical Diagnosis N81.10 Cystocele, unspecified    Referring Provider (PT) Dr. Dian Queen    Onset Date/Surgical Date 05/18/19    Prior Therapy None      Precautions   Precautions Other (comment)    Precaution Comments thyroid cancer      Restrictions   Weight  Bearing Restrictions No      Balance Screen   Has the patient fallen in the past 6 months No    Has the patient had a decrease in activity level because of a fear of falling?  No    Is the patient reluctant to leave their home because of a fear of falling?  No      Home Ecologist residence      Prior Function   Level of Independence Independent    Leisure Active in church      Cognition   Overall Cognitive Status Within Functional Limits for tasks assessed      Posture/Postural Control   Posture/Postural Control No significant limitations      ROM / Strength   AROM / PROM / Strength AROM;PROM;Strength      AROM   Lumbar Extension decreased by 50%    Lumbar - Right Side Bend decreased by 25%      PROM   Right Hip External Rotation  35    Left Hip External Rotation  30      Strength   Right Hip ABduction 3+/5    Left Hip ABduction 3/5  Objective measurements completed on examination: See above findings.     Pelvic Floor Special Questions - 08/18/19 0001    Prior Pregnancies Yes    Number of Pregnancies 2    Any difficulty with labor and deliveries No    Urinary Leakage No    Fecal incontinence No   no constipation   Prolapse Anterior Wall   to just above the entrance   Pelvic Floor Internal Exam Patient confirms identification and approves PT to assess pelvic floor and treatment    Exam Type Vaginal    Palpation tightness in the sphincter muscle and posterior perineal body    Strength Flicker    Strength # of seconds 1                    PT Education - 08/18/19 1701    Education Details Pelvic floor contraction in supine    Person(s) Educated Patient    Methods Explanation;Handout;Demonstration    Comprehension Verbalized understanding;Returned demonstration            PT Short Term Goals - 08/18/19 1208      PT SHORT TERM GOAL #1   Title independent with initial HEP    Time 4     Period Weeks    Status New    Target Date 09/15/19             PT Long Term Goals - 08/18/19 1208      PT LONG TERM GOAL #1   Title independent with advanced HEP    Time 8    Period Weeks    Status New    Target Date 10/13/19      PT LONG TERM GOAL #2   Title understand ways to manage prolapse during the day with laying down with hips elevated    Time 8    Period Weeks    Status New    Target Date 10/13/19      PT LONG TERM GOAL #3   Title reports prolapse reduce 50% of the time due to increased in pelvic floor strength    Time 8    Period Weeks    Status New    Target Date 10/13/19      PT LONG TERM GOAL #4   Title understand how to lift with correct body mechanics and breath to reduce pressure on the bladder    Time 8    Period Weeks    Status New    Target Date 10/13/19      PT LONG TERM GOAL #5   Title pelvic floor strength 3/5 with out bearing down and holding for 10 seconds    Time 8    Period Weeks    Status New    Target Date 10/13/19                  Plan - 08/18/19 1226    Clinical Impression Statement Patient is a 79 year old female with grade 2 cystocele. Patient reports no pain or urinary leakage. Pelvic floor strength is 1/5 and she will bear down and contract her gluteals with contraction. When patient laughs or coughs she will bear down. Patient has tightness posterior fourchette, perineal body and vaginal sphincter. Patient will bulge her abdomen when she contracts. Bilateral hip abduction strength is 3/5. Patient will benefit from skilled therapy to improve pelvic floor strength and coordination.    Personal Factors and Comorbidities Comorbidity 3+;Age    Comorbidities Hysterectomy;  IBS; Thyroid cancer    Examination-Activity Limitations Lift;Stand;Locomotion Level    Stability/Clinical Decision Making Evolving/Moderate complexity    Clinical Decision Making Moderate    Rehab Potential Excellent    PT Frequency 1x / week    PT  Duration 8 weeks    PT Treatment/Interventions Biofeedback;Electrical Stimulation;Neuromuscular re-education;Therapeutic exercise;Therapeutic activities;Patient/family education;Manual techniques    PT Next Visit Plan pelvic floor soft tissue work,    Oncologist with Plan of Care Patient           Patient will benefit from skilled therapeutic intervention in order to improve the following deficits and impairments:  Decreased coordination, Increased fascial restricitons, Decreased strength, Decreased activity tolerance, Decreased endurance  Visit Diagnosis: Muscle weakness (generalized) - Plan: PT plan of care cert/re-cert  Other lack of coordination - Plan: PT plan of care cert/re-cert     Problem List Patient Active Problem List   Diagnosis Date Noted  . Abnormal findings on diagnostic imaging of lung 01/13/2019  . Hearing loss 08/15/2016  . Colon polyps 08/15/2016  . Cystitis 05/05/2015  . Leg pain, bilateral 05/04/2014  . IBS (irritable bowel syndrome) 11/06/2013  . Generalized anxiety disorder 06/04/2013  . Hyperglycemia 10/08/2012  . Well adult exam 02/05/2012  . Hypothyroidism 05/13/2010  . Hx of thyroid cancer 12/02/2008  . COLONIC POLYPS, HX OF 11/24/2008  . Dyslipidemia 05/30/2007  . Essential hypertension 05/30/2007  . Carotid artery stenosis without cerebral infarction 05/30/2007  . GERD 05/30/2007    Earlie Counts, PT 08/18/19 5:11 PM   [facility] [address], [place], [place], [address] Phone: [phone]   Fax:  [phone]  Name: Lauren Mcgee MRN: [id number] Date of Birth: 23-Aug-1940

## 2019-08-25 ENCOUNTER — Other Ambulatory Visit: Payer: Self-pay | Admitting: Internal Medicine

## 2019-08-25 ENCOUNTER — Other Ambulatory Visit: Payer: Self-pay

## 2019-08-25 ENCOUNTER — Ambulatory Visit: Payer: Medicare Other | Admitting: Physical Therapy

## 2019-08-25 ENCOUNTER — Encounter: Payer: Self-pay | Admitting: Physical Therapy

## 2019-08-25 DIAGNOSIS — R278 Other lack of coordination: Secondary | ICD-10-CM | POA: Diagnosis not present

## 2019-08-25 DIAGNOSIS — M6281 Muscle weakness (generalized): Secondary | ICD-10-CM | POA: Diagnosis not present

## 2019-08-25 NOTE — Patient Instructions (Signed)
Access Code: [email or web address] URL: [email or web address] Date: 08/25/2019 Prepared by: Earlie Counts  Exercises Supine Transversus Abdominis Bracing - Hands on Stomach - 1 x daily - 7 x weekly - 1 sets - 10 reps - 5 sec hold Supine Pelvic Floor Contraction - 2 x daily - 7 x weekly - 1 sets - 10 reps - 5 sec hold Supine Diaphragmatic Breathing - 1 x daily - 7 x weekly - 1 sets - 10 reps [facility] [address], [place], [place] [address] Phone # [phone] Fax [phone]

## 2019-08-25 NOTE — Therapy (Addendum)
[facility] [address] [place], [place], [address] Phone: [phone]   Fax:  [phone]  Physical Therapy Treatment  Patient Details  Name: Lauren Mcgee MRN: [id number] Date of Birth: Feb 21, 1941 Referring Provider (PT): Dr. Dian Queen   Encounter Date: 08/25/2019   PT End of Session - 08/25/19 1706    Visit Number 2    Date for PT Re-Evaluation 10/13/19    Authorization Type UHC medicare    PT Start Time 2703    PT Stop Time 5009    PT Time Calculation (min) 50 min    Activity Tolerance Patient tolerated treatment well;No increased pain    Behavior During Therapy WFL for tasks assessed/performed           Past Medical History:  Diagnosis Date   Diverticulosis of colon (without mention of hemorrhage)    GERD (gastroesophageal reflux disease)    Hx of colonic polyp 2001   TUBULAR ADENOMA   Hyperlipidemia    Hypertension    Hypothyroidism    IBS (irritable bowel syndrome)    PVD (peripheral vascular disease) ([place])    Thyroid cancer ([place]) 2011    Past Surgical History:  Procedure Laterality Date   ABDOMINAL HYSTERECTOMY  1991   COLONOSCOPY  2010   562.10   ESOPHAGOGASTRODUODENOSCOPY     multiple   THYROIDECTOMY  2010   and 131I treatment at [facility]- Dr. Celine Ahr    There were no vitals filed for this visit.   Subjective Assessment - 08/25/19 1625    Subjective No changes since the initial evaluation.    Patient Stated Goals pelvic floor strength    Currently in Pain? No/denies                          Pelvic Floor Special Questions - 08/25/19 0001    Pelvic Floor Internal Exam Patient confirms identification and approves PT to assess pelvic floor and treatment    Exam Type Vaginal    Strength Flicker             [facility] Adult PT Treatment/Exercise - 08/25/19 0001      Neuro Re-ed    Neuro Re-ed Details  tactile cues to contract the pelvic floor, not  contract the gluteals, not hold breath and contract the hip adductors, Patient needed manu verbal and tactile cues      Lumbar Exercises: Aerobic   Nustep level 2 for 5 minutes while assessing patient      Lumbar Exercises: Supine   Ab Set 15 reps    AB Set Limitations with ball squeeze, tactile cues to contract the abdominals    Bridge --   1 time then leg cramps   Other Supine Lumbar Exercises 360 breathing to expand the rib cage posteriorly and on the sides then to expand the abdomen      Manual Therapy   Manual Therapy Internal Pelvic Floor    Internal Pelvic Floor soft tissue work to the periineal body, along the introitus, levator ani, along the right urethra sphincter                  PT Education - 08/25/19 1702    Education Details Access Code: [email or web address]    Person(s) Educated Patient    Methods Explanation;Demonstration;Verbal cues;Handout    Comprehension Returned demonstration;Verbalized understanding            PT Short Term Goals - 08/18/19  [name] #1   Title independent with initial HEP    Time 4    Period Weeks    Status New    Target Date 09/15/19             PT Long Term Goals - 08/18/19 1208      PT LONG TERM GOAL #1   Title independent with advanced HEP    Time 8    Period Weeks    Status New    Target Date 10/13/19      PT LONG TERM GOAL #2   Title understand ways to manage prolapse during the day with laying down with hips elevated    Time 8    Period Weeks    Status New    Target Date 10/13/19      PT LONG TERM GOAL #3   Title reports prolapse reduce 50% of the time due to increased in pelvic floor strength    Time 8    Period Weeks    Status New    Target Date 10/13/19      PT LONG TERM GOAL #4   Title understand how to lift with correct body mechanics and breath to reduce pressure on the bladder    Time 8    Period Weeks    Status New    Target Date 10/13/19      PT LONG TERM GOAL #5   Title  pelvic floor strength 3/5 with out bearing down and holding for 10 seconds    Time 8    Period Weeks    Status New    Target Date 10/13/19                 Plan - 08/25/19 1626    Clinical Impression Statement Patient needs more verbal cues to contract the pelvic floor without substitution. Patient was able to expand her lower rib cage posteriorly and on the sides after therapy. Patient was able to isolate the pelvic floor 3 times by the end of therapy. Patient just started therapy and has not met goals yet. Patient will benefit from skilled therapy to improve pelvic floor strength and coordination.    Personal Factors and Comorbidities Comorbidity 3+;Age    Comorbidities Hysterectomy; IBS; Thyroid cancer    Examination-Activity Limitations Lift;Stand;Locomotion Level    Stability/Clinical Decision Making Evolving/Moderate complexity    Rehab Potential Excellent    PT Frequency 1x / week    PT Duration 8 weeks    PT Treatment/Interventions Biofeedback;Electrical Stimulation;Neuromuscular re-education;Therapeutic exercise;Therapeutic activities;Patient/family education;Manual techniques    PT Next Visit Plan pelvic floor soft tissue work to pelvic floor, ways to manage prolapse with resting and hips elevated, breathing out with strenous work,    PT Home Exercise Plan Access Code: [phone]    Recommended Other Services MD signed the intial summary    Consulted and Agree with Plan of Care Patient           Patient will benefit from skilled therapeutic intervention in order to improve the following deficits and impairments:  Decreased coordination, Increased fascial restricitons, Decreased strength, Decreased activity tolerance, Decreased endurance  Visit Diagnosis: Muscle weakness (generalized)  Other lack of coordination     Problem List Patient Active Problem List   Diagnosis Date Noted   Abnormal findings on diagnostic imaging of lung 01/13/2019   Hearing loss  08/15/2016   Colon polyps 08/15/2016   Cystitis 05/05/2015  Leg pain, bilateral 05/04/2014   IBS (irritable bowel syndrome) 11/06/2013   Generalized anxiety disorder 06/04/2013   Hyperglycemia 10/08/2012   Well adult exam 02/05/2012   Hypothyroidism 05/13/2010   Hx of thyroid cancer 12/02/2008   COLONIC POLYPS, HX OF 11/24/2008   Dyslipidemia 05/30/2007   Essential hypertension 05/30/2007   Carotid artery stenosis without cerebral infarction 05/30/2007   GERD 05/30/2007    Earlie Counts, PT 08/25/19 5:10 PM   [facility] [address] [place], [place], [address] Phone: [phone]   Fax:  [phone]  Name: Lauren Mcgee MRN: [id number] Date of Birth: [birth year]  PHYSICAL THERAPY DISCHARGE SUMMARY  Visits from Start of Care: 2  Current functional level related to goals / functional outcomes: See above. Patient called on 10/03/2019 to report she wanted to be discharged from physical therapy.   Remaining deficits: See above.    Education / Equipment: HEP Plan: Patient agrees to discharge.  Patient goals were not met. Patient is being discharged due to the patient's request. Thank you for the referral. Earlie Counts, PT 10/06/19 8:50 AM   ?????

## 2019-09-22 ENCOUNTER — Ambulatory Visit: Payer: Medicare Other | Admitting: Physical Therapy

## 2019-10-06 ENCOUNTER — Ambulatory Visit: Payer: Medicare Other | Admitting: Physical Therapy

## 2019-10-22 ENCOUNTER — Encounter: Payer: Medicare Other | Admitting: Physical Therapy

## 2019-10-27 DIAGNOSIS — I10 Essential (primary) hypertension: Secondary | ICD-10-CM | POA: Diagnosis not present

## 2019-10-27 DIAGNOSIS — E7800 Pure hypercholesterolemia, unspecified: Secondary | ICD-10-CM | POA: Diagnosis not present

## 2019-11-04 DIAGNOSIS — H26493 Other secondary cataract, bilateral: Secondary | ICD-10-CM | POA: Diagnosis not present

## 2019-11-18 ENCOUNTER — Other Ambulatory Visit: Payer: Self-pay | Admitting: Internal Medicine

## 2019-11-24 DIAGNOSIS — H26491 Other secondary cataract, right eye: Secondary | ICD-10-CM | POA: Diagnosis not present

## 2019-11-25 DIAGNOSIS — H18413 Arcus senilis, bilateral: Secondary | ICD-10-CM | POA: Diagnosis not present

## 2019-11-25 DIAGNOSIS — H26491 Other secondary cataract, right eye: Secondary | ICD-10-CM | POA: Diagnosis not present

## 2019-11-25 DIAGNOSIS — Z961 Presence of intraocular lens: Secondary | ICD-10-CM | POA: Diagnosis not present

## 2019-11-25 DIAGNOSIS — H40013 Open angle with borderline findings, low risk, bilateral: Secondary | ICD-10-CM | POA: Diagnosis not present

## 2019-12-16 ENCOUNTER — Ambulatory Visit: Payer: Medicare Other | Admitting: Internal Medicine

## 2019-12-16 ENCOUNTER — Encounter: Payer: Self-pay | Admitting: Internal Medicine

## 2019-12-16 ENCOUNTER — Other Ambulatory Visit: Payer: Self-pay

## 2019-12-16 VITALS — BP 130/78 | HR 62 | Ht 65.5 in | Wt 158.8 lb

## 2019-12-16 DIAGNOSIS — E890 Postprocedural hypothyroidism: Secondary | ICD-10-CM

## 2019-12-16 DIAGNOSIS — Z85850 Personal history of malignant neoplasm of thyroid: Secondary | ICD-10-CM | POA: Diagnosis not present

## 2019-12-16 DIAGNOSIS — Z23 Encounter for immunization: Secondary | ICD-10-CM

## 2019-12-16 LAB — TSH: TSH: 5.42 u[IU]/mL — ABNORMAL HIGH (ref 0.35–4.50)

## 2019-12-16 LAB — T4, FREE: Free T4: 0.7 ng/dL (ref 0.60–1.60)

## 2019-12-16 NOTE — Patient Instructions (Signed)
Please continue Synthroid 75 mcg daily.  Take the thyroid hormone every day, with water, at least 30 minutes before breakfast, separated by at least 4 hours from: - acid reflux medications - calcium - iron - multivitamins  Please stop at the lab.  Please come back for a follow-up appointment in 6 months.

## 2019-12-16 NOTE — Progress Notes (Signed)
Patient ID: Lauren Mcgee, female   DOB: [birth year], 79 y.o.   MRN: [id number]  This visit occurred during the SARS-CoV-2 public health emergency.  Safety protocols were in place, including screening questions prior to the visit, additional usage of staff PPE, and extensive cleaning of exam room while observing appropriate contact time as indicated for disinfecting solutions.   HPI  Lauren Mcgee is a 79 y.o.-year-old female, returning for f/u for follicular and papillary thyroid cancer, in remission, and postsurgical hypothyroidism. Last visit 6 months ago.  She was recently investigated for lung nodules.  These were stable but a new CT is planned 6 months from the previous.  Reviewed history: Thyroid cancer  - dx'ed in 2010.  Thyroidectomy  on 12/07/2009 (Dr Fredirick Maudlin) for a thyroid tumor >> ThyCA: Minimally invasive follicular carcinoma of the right thyroid lobe, measuring 2.7 cm, and multifocal papillary microcarcinoma bilaterally. No nodes sampled. One of the papillary tumors was approaching and may have involved the surgical resection margin. There was minimal extra thyroid though invasion of one of the papillary microcarcinoma's, but no lymphovascular invasion.  RAI tx with 154 mCi I-131 on 03/16/2010. At that time TSH was >100, thyroglobulin was 0.7, ATA<20  Post treatment WBS did not show metastatic disease. There were foci of activity which were previously seen on the pretherapy scan. No new lesions identified.  Neck U/S 12/09/2012: Post thyroidectomy. No evidence of residual thyroid tissue or mass/nodule at the thyroid bed.  CT chest 06/27/2019: Lungs/Pleura: Again noted is a slightly irregular nodule in the superior segment of the right lower lobe with irregular margins. It appears essentially unchanged in size measuring approximately 6.7 mm maximum diameter. There is new posterior pleural thickening in the right hemithorax best seen on image 59 of series 6. There is a  new faint ground-glass 4 mm nodule in the right upper lobe seen on image 58 of series 6 and image 33 of series 3. The lungs are otherwise clear.  Reviewed her thyroglobulin and ATA levels: Lab Results  Component Value Date   THYROGLB <0.1 (L) 01/23/2019   THYROGLB 0.1 (L) 01/11/2018   THYROGLB 0.1 (L) 12/06/2016   THYROGLB <0.1 (L) 10/12/2015   THYROGLB <0.1 (L) 10/12/2014   THYROGLB <0.2 06/24/2013   THYROGLB <0.2 12/02/2012   THGAB <1 01/23/2019   THGAB <1 01/11/2018   THGAB <1 12/06/2016   THGAB <1 10/12/2015   THGAB <1 10/12/2014   THGAB <20.0 06/24/2013   THGAB <20.0 12/02/2012   Postsurgical hypothyroidism:  She is on Synthroid 75 mcg daily: - in am - fasting - 1h from b'fast - no Ca, Fe, MVI, + PPIs later in the day, occasionally - not on Biotin  Reviewed her TFTs: Lab Results  Component Value Date   TSH 1.83 06/12/2019   TSH 0.57 04/15/2019   TSH 0.30 (L) 03/12/2019   TSH 0.18 (L) 01/23/2019   TSH 0.09 (L) 11/22/2018   TSH 0.88 04/03/2018   TSH 9.04 (H) 02/18/2018   TSH 13.40 (H) 01/11/2018   TSH 0.88 12/06/2016   TSH 0.72 08/28/2016   FREET4 0.75 06/12/2019   FREET4 0.94 04/15/2019   FREET4 1.08 03/12/2019   FREET4 1.09 01/23/2019   FREET4 1.05 12/06/2018   FREET4 1.14 04/03/2018   FREET4 0.69 02/18/2018   FREET4 0.67 01/11/2018   FREET4 1.02 12/06/2016   FREET4 0.93 08/28/2016   Pt denies: - feeling nodules in neck - hoarseness - choking - SOB with lying down But does  have chronic dysphagia, which started before her surgery.  She continues atenolol per cardiology.  She also has a history of HTN, HL (Crestor >> fatigue), PVD, GERD.   Her son had COVID-19 infection in 02/2019.  ROS: Constitutional: no weight gain/no weight loss, no fatigue, + subjective hyperthermia, no subjective hypothermia Eyes: no blurry vision, no xerophthalmia ENT: no sore throat, no nodules palpated in neck, no dysphagia, no odynophagia, no  hoarseness Cardiovascular: no CP/no SOB/no palpitations/no leg swelling Respiratory: no cough/no SOB/no wheezing Gastrointestinal: no N/no V/no D/no C/no acid reflux Musculoskeletal: no muscle aches/no joint aches Skin: no rashes, no hair loss Neurological: no tremors/no numbness/no tingling/no dizziness  I reviewed pt's medications, allergies, PMH, social hx, family hx, and changes were documented in the history of present illness. Otherwise, unchanged from my initial visit note.  Past Medical History:  Diagnosis Date  . Diverticulosis of colon (without mention of hemorrhage)   . GERD (gastroesophageal reflux disease)   . Hx of colonic polyp 2001   TUBULAR ADENOMA  . Hyperlipidemia   . Hypertension   . Hypothyroidism   . IBS (irritable bowel syndrome)   . PVD (peripheral vascular disease) ([place])   . Thyroid cancer ([place]) 2011   Past Surgical History:  Procedure Laterality Date  . ABDOMINAL HYSTERECTOMY  1991  . COLONOSCOPY  2010   562.10  . ESOPHAGOGASTRODUODENOSCOPY     multiple  . THYROIDECTOMY  2010   and 131I treatment at [facility]- Dr. Celine Ahr   Social History   Socioeconomic History  . Marital status: Married    Spouse name: Not on file  . Number of children: 2  . Years of education: Not on file  . Highest education level: Not on file  Occupational History  . Occupation: retired  Tobacco Use  . Smoking status: Former Smoker    Packs/day: 0.50    Types: Cigarettes    Quit date: 03/06/1958    Years since quitting: 61.8  . Smokeless tobacco: Never Used  Vaping Use  . Vaping Use: Never used  Substance and Sexual Activity  . Alcohol use: No  . Drug use: No  . Sexual activity: Yes  Other Topics Concern  . Not on file  Social History Narrative   Retired   Married 2 kids   Regular exercise: not outside the home    Caffeine use: coffee daily   Former smoker, no alcohol, no drugs   Social Determinants of Radio broadcast assistant Strain:   . Difficulty of  Paying Living Expenses: Not on file  Food Insecurity:   . Worried About Charity fundraiser in the Last Year: Not on file  . Ran Out of Food in the Last Year: Not on file  Transportation Needs:   . Lack of Transportation (Medical): Not on file  . Lack of Transportation (Non-Medical): Not on file  Physical Activity:   . Days of Exercise per Week: Not on file  . Minutes of Exercise per Session: Not on file  Stress:   . Feeling of Stress : Not on file  Social Connections:   . Frequency of Communication with Friends and Family: Not on file  . Frequency of Social Gatherings with Friends and Family: Not on file  . Attends Religious Services: Not on file  . Active Member of Clubs or Organizations: Not on file  . Attends Archivist Meetings: Not on file  . Marital Status: Not on file  Intimate Partner Violence:   .  Fear of Current or Ex-Partner: Not on file  . Emotionally Abused: Not on file  . Physically Abused: Not on file  . Sexually Abused: Not on file   Current Outpatient Medications on File Prior to Visit  Medication Sig Dispense Refill  . aspirin 81 MG chewable tablet Chew 81 mg by mouth daily.      Marland Kitchen atenolol (TENORMIN) 25 MG tablet TAKE 1 TABLET BY MOUTH EVERY DAY 90 tablet 3  . cholecalciferol (VITAMIN D) 1000 UNITS tablet Take 1,000 Units by mouth daily.      Marland Kitchen Cod Liver Oil 1000 MG CAPS Take 2 capsules by mouth daily.    . DENTA 5000 PLUS 1.1 % CREA dental cream     . escitalopram (LEXAPRO) 10 MG tablet Take 1 tablet (10 mg total) by mouth daily. 30 tablet 11  . Omega-3 Fatty Acids (FISH OIL) 1000 MG CAPS Take 1-2 capsules by mouth daily.    Marland Kitchen omeprazole (PRILOSEC OTC) 20 MG tablet Take 20 mg by mouth daily.    . pravastatin (PRAVACHOL) 40 MG tablet Take 40 mg by mouth daily.    Marland Kitchen saccharomyces boulardii (FLORASTOR) 250 MG capsule Take 1 capsule (250 mg total) by mouth daily as needed.    Marland Kitchen SYNTHROID 75 MCG tablet TAKE 1 TABLET (75 MCG TOTAL) BY MOUTH DAILY BEFORE  BREAKFAST. 90 tablet 1   No current facility-administered medications on file prior to visit.   Allergies  Allergen Reactions  . Influenza Vaccines     Local reaction   Family History  Problem Relation Age of Onset  . Hyperlipidemia Mother   . Heart disease Mother   . Hypertension Other   . Colon cancer Paternal Aunt 78  . Colon cancer Son 77  . Colon polyps Neg Hx   . Liver cancer Neg Hx   . Pancreatic cancer Neg Hx   . Rectal cancer Neg Hx   . Stomach cancer Neg Hx     PE: BP 130/78   Pulse 62   Ht 5' 5.5" (1.664 m)   Wt 158 lb 12.8 oz (72 kg)   SpO2 98%   BMI 26.02 kg/m  Body mass index is 26.02 kg/m. Wt Readings from Last 3 Encounters:  12/16/19 158 lb 12.8 oz (72 kg)  08/05/19 160 lb 6.4 oz (72.8 kg)  06/12/19 160 lb 8 oz (72.8 kg)   Constitutional: overweight, in NAD Eyes: PERRLA, EOMI, no exophthalmos ENT: moist mucous membranes, no neck masses, no cervical lymphadenopathy Cardiovascular: RRR, No MRG Respiratory: CTA B Gastrointestinal: abdomen soft, NT, ND, BS+ Musculoskeletal: no deformities, strength intact in all 4 Skin: moist, warm, no rashes Neurological: no tremor with outstretched hands, DTR normal in all 4  ASSESSMENT: 1. Hypothyroidism  2. History of thyroid cancer  PLAN:  1. Patient with longstanding postsurgical hypothyroidism, on brand-name levothyroxine - in the past, she had increased perspiration and heat intolerance.  TSH was suppressed so we decreased her Synthroid dose - latest thyroid labs reviewed with pt >> normal: Lab Results  Component Value Date   TSH 1.83 06/12/2019   - she continues on Synthroid d.a.w. 75 mcg daily, dose decreased 03/2019 - pt feels good on this dose. - we discussed about taking the thyroid hormone every day, with water, >30 minutes before breakfast, separated by >4 hours from acid reflux medications, calcium, iron, multivitamins. Pt. is taking it correctly. - will check thyroid tests today: TSH and  fT4 - If labs are abnormal, she will need  to return for repeat TFTs in 1.5 months - RTC in 6 months  2. PTC -In remission -Reviewed records available from Dr. Celine Ahr -Both thyroglobulin and ATA levels were undetectable at last check -No neck compression symptoms and no masses felt on palpation of her neck today -We will obtain another neck ultrasound if Tg starts increasing - we reviewed together the result of he chest CT >> small lung nodules  - she will have a repeat CT in 02/2020. I doubt a relationship with her thyroid cancer but would like to wait for the new CT scan and reevaluate  + Flu shot today.  Needs refills.   Component     Latest Ref Rng & Units 12/16/2019  Thyroglobulin     ng/mL 0.1 (L)  Comment        TSH     0.35 - 4.50 uIU/mL 5.42 (H)  T4,Free(Direct)     0.60 - 1.60 ng/dL 0.70  Thyroglobulin Ab     < or = 1 IU/mL <1   TSH is slightly high, but due to previous normal TFTs, I would like to repeat the test in 1.5 months before changing the dose.  Thyroglobulin is at the lower limit of normal.  Patient has been fluctuating in the past between undetectable and 0.1.  I suspect that the fluctuations are related to variability of the assay.  Philemon Kingdom, MD PhD [facility] Endocrinology

## 2019-12-17 LAB — THYROGLOBULIN ANTIBODY: Thyroglobulin Ab: <1 IU/mL (ref <=1)

## 2019-12-17 LAB — THYROGLOBULIN LEVEL: Thyroglobulin: 0.1 ng/mL — ABNORMAL LOW

## 2019-12-18 MED ORDER — SYNTHROID 75 MCG PO TABS: 75.0000 ug | ORAL_TABLET | Freq: Every day | ORAL | 3 refills | Status: DC

## 2019-12-31 ENCOUNTER — Other Ambulatory Visit: Payer: Self-pay

## 2019-12-31 ENCOUNTER — Ambulatory Visit (INDEPENDENT_AMBULATORY_CARE_PROVIDER_SITE_OTHER): Payer: Medicare Other | Admitting: Internal Medicine

## 2019-12-31 ENCOUNTER — Encounter: Payer: Self-pay | Admitting: Internal Medicine

## 2019-12-31 DIAGNOSIS — I2510 Atherosclerotic heart disease of native coronary artery without angina pectoris: Secondary | ICD-10-CM | POA: Diagnosis not present

## 2019-12-31 DIAGNOSIS — E785 Hyperlipidemia, unspecified: Secondary | ICD-10-CM | POA: Diagnosis not present

## 2019-12-31 DIAGNOSIS — F411 Generalized anxiety disorder: Secondary | ICD-10-CM

## 2019-12-31 DIAGNOSIS — I2583 Coronary atherosclerosis due to lipid rich plaque: Secondary | ICD-10-CM

## 2019-12-31 DIAGNOSIS — I6523 Occlusion and stenosis of bilateral carotid arteries: Secondary | ICD-10-CM | POA: Diagnosis not present

## 2019-12-31 DIAGNOSIS — I10 Essential (primary) hypertension: Secondary | ICD-10-CM | POA: Diagnosis not present

## 2019-12-31 MED ORDER — PRAVASTATIN SODIUM 40 MG PO TABS
40.0000 mg | ORAL_TABLET | Freq: Every day | ORAL | 3 refills | Status: DC
Start: 2019-12-31 — End: 2020-09-15

## 2019-12-31 MED ORDER — SYNTHROID 75 MCG PO TABS
75.0000 ug | ORAL_TABLET | Freq: Every day | ORAL | 3 refills | Status: DC
Start: 2019-12-31 — End: 2020-08-25

## 2019-12-31 MED ORDER — FLUOXETINE HCL 20 MG PO TABS: 20.0000 mg | ORAL_TABLET | Freq: Every day | ORAL | 3 refills | Status: DC

## 2019-12-31 NOTE — Progress Notes (Signed)
Subjective:  Patient ID: Lauren Mcgee, female    DOB: Sep 25, 1940  Age: 79 y.o. MRN: [id number]  CC: Follow-up (6 MONTH FOLLOW-UP)   HPI Lauren Mcgee presents for depression. C/o being nervous after missing a dose or two of Lexapro Pt stopped a statin a year ago F/u HTN  Outpatient Medications Prior to Visit  Medication Sig Dispense Refill  . aspirin 81 MG chewable tablet Chew 81 mg by mouth daily.      Marland Kitchen atenolol (TENORMIN) 25 MG tablet TAKE 1 TABLET BY MOUTH EVERY DAY 90 tablet 3  . cholecalciferol (VITAMIN D) 1000 UNITS tablet Take 1,000 Units by mouth daily.      Marland Kitchen Cod Liver Oil 1000 MG CAPS Take 2 capsules by mouth daily.    . DENTA 5000 PLUS 1.1 % CREA dental cream     . escitalopram (LEXAPRO) 10 MG tablet Take 1 tablet (10 mg total) by mouth daily. 30 tablet 11  . Omega-3 Fatty Acids (FISH OIL) 1000 MG CAPS Take 1-2 capsules by mouth daily.    Marland Kitchen omeprazole (PRILOSEC OTC) 20 MG tablet Take 20 mg by mouth daily.    Marland Kitchen saccharomyces boulardii (FLORASTOR) 250 MG capsule Take 1 capsule (250 mg total) by mouth daily as needed.    Marland Kitchen SYNTHROID 75 MCG tablet Take 1 tablet (75 mcg total) by mouth daily before breakfast. 45 tablet 3  . pravastatin (PRAVACHOL) 40 MG tablet Take 40 mg by mouth daily. (Patient not taking: Reported on 12/16/2019)     No facility-administered medications prior to visit.    ROS: Review of Systems  Constitutional: Negative for activity change, appetite change, chills, fatigue and unexpected weight change.  HENT: Negative for congestion, mouth sores and sinus pressure.   Eyes: Negative for visual disturbance.  Respiratory: Negative for cough and chest tightness.   Gastrointestinal: Negative for abdominal pain and nausea.  Genitourinary: Negative for difficulty urinating, frequency and vaginal pain.  Musculoskeletal: Positive for arthralgias. Negative for back pain and gait problem.  Skin: Negative for pallor and rash.  Neurological: Negative for dizziness,  tremors, weakness, numbness and headaches.  Psychiatric/Behavioral: Negative for confusion and sleep disturbance.    Objective:  BP 140/82 (BP Location: Left Arm)   Pulse 78   Temp 98.6 F (37 C) (Oral)   Wt 161 lb 12.8 oz (73.4 kg)   SpO2 97%   BMI 26.52 kg/m   BP Readings from Last 3 Encounters:  12/31/19 140/82  12/16/19 130/78  08/05/19 126/72    Wt Readings from Last 3 Encounters:  12/31/19 161 lb 12.8 oz (73.4 kg)  12/16/19 158 lb 12.8 oz (72 kg)  08/05/19 160 lb 6.4 oz (72.8 kg)    Physical Exam Constitutional:      General: She is not in acute distress.    Appearance: She is well-developed.  HENT:     Head: Normocephalic.     Right Ear: External ear normal.     Left Ear: External ear normal.     Nose: Nose normal.  Eyes:     General:        Right eye: No discharge.        Left eye: No discharge.     Conjunctiva/sclera: Conjunctivae normal.     Pupils: Pupils are equal, round, and reactive to light.  Neck:     Thyroid: No thyromegaly.     Vascular: No JVD.     Trachea: No tracheal deviation.  Cardiovascular:  Rate and Rhythm: Normal rate and regular rhythm.     Heart sounds: Normal heart sounds.  Pulmonary:     Effort: No respiratory distress.     Breath sounds: No stridor. No wheezing.  Abdominal:     General: Bowel sounds are normal. There is no distension.     Palpations: Abdomen is soft. There is no mass.     Tenderness: There is no abdominal tenderness. There is no guarding or rebound.  Musculoskeletal:        General: No tenderness.     Cervical back: Normal range of motion and neck supple.  Lymphadenopathy:     Cervical: No cervical adenopathy.  Skin:    Findings: No erythema or rash.  Neurological:     Mental Status: She is oriented to person, place, and time.     Cranial Nerves: No cranial nerve deficit.     Motor: No abnormal muscle tone.     Coordination: Coordination normal.     Deep Tendon Reflexes: Reflexes normal.    Psychiatric:        Behavior: Behavior normal.        Thought Content: Thought content normal.        Judgment: Judgment normal.     Lab Results  Component Value Date   WBC 6.5 11/22/2018   HGB 14.4 11/22/2018   HCT 42.1 11/22/2018   PLT 157.0 11/22/2018   GLUCOSE 97 11/22/2018   CHOL 173 11/22/2018   TRIG 163.0 (H) 11/22/2018   HDL 43.20 11/22/2018   LDLDIRECT 158.1 11/24/2008   LDLCALC 97 11/22/2018   ALT 22 11/22/2018   AST 25 11/22/2018   NA 140 11/22/2018   K 4.0 11/22/2018   CL 106 11/22/2018   CREATININE 0.83 11/22/2018   BUN 8 11/22/2018   CO2 25 11/22/2018   TSH 5.42 (H) 12/16/2019   HGBA1C 5.5 05/05/2015    CT CHEST WO CONTRAST  Result Date: 06/27/2019 CLINICAL DATA:  Follow-up of pulmonary nodule in the right lung detected on heart CT scan dated 12/27/2018. EXAM: CT CHEST WITHOUT CONTRAST TECHNIQUE: Multidetector CT imaging of the chest was performed following the standard protocol without IV contrast. COMPARISON:  CT scan dated 12/27/2018 FINDINGS: Cardiovascular: Aortic atherosclerosis. Focal coronary artery calcifications. Heart size is normal. No pericardial effusion. Mediastinum/Nodes: No enlarged mediastinal or axillary lymph nodes. Thyroid gland, trachea, and esophagus demonstrate no significant findings. Lungs/Pleura: Again noted is a slightly irregular nodule in the superior segment of the right lower lobe with irregular margins. It appears essentially unchanged in size measuring approximately 6.7 mm maximum diameter. There is new posterior pleural thickening in the right hemithorax best seen on image 59 of series 6. There is a new faint ground-glass 4 mm nodule in the right upper lobe seen on image 58 of series 6 and image 33 of series 3. The lungs are otherwise clear. Upper Abdomen: 5 mm stone in the neck of the gallbladder. Otherwise benign appearing abdomen. Musculoskeletal: No chest wall mass or suspicious bone lesions identified. IMPRESSION: 1. Stable  slightly irregular nodule in the superior segment of the right lower lobe. 2. New posterior pleural thickening in the right hemithorax. 3. New faint ground-glass 4 mm nodule in the right upper lobe. 4. In view of the ground-glass lesion in the right upper lobe in a region not covered by the prior heart CT scan and the persistent right lower lobe nodule with irregularity, I recommend a follow-up CT scan of the chest without contrast in  6 months to determine ongoing stability of both lesions. 5. Aortic atherosclerosis. Aortic Atherosclerosis (ICD10-I70.0). Electronically Signed   By: Lorriane Shire M.D.   On: 06/27/2019 16:56    Assessment & Plan:   There are no diagnoses linked to this encounter.   No orders of the defined types were placed in this encounter.    Follow-up: No follow-ups on file.  Walker Kehr, MD

## 2019-12-31 NOTE — Assessment & Plan Note (Signed)
BP Readings from Last 3 Encounters:  12/31/19 140/82  12/16/19 130/78  08/05/19 126/72

## 2019-12-31 NOTE — Assessment & Plan Note (Signed)
Re-start Pravastatin Risks associated with treatment noncompliance were discussed. Compliance was encouraged.

## 2019-12-31 NOTE — Assessment & Plan Note (Signed)
Change to Prozac - pt wants to use brand Rx

## 2019-12-31 NOTE — Assessment & Plan Note (Signed)
2020 Cor CT ca score 122   Re-start Pravastatin Risks associated with treatment noncompliance were discussed. Compliance was encouraged.

## 2020-01-16 ENCOUNTER — Encounter: Payer: Self-pay | Admitting: Internal Medicine

## 2020-01-16 DIAGNOSIS — Z1231 Encounter for screening mammogram for malignant neoplasm of breast: Secondary | ICD-10-CM | POA: Diagnosis not present

## 2020-01-28 DIAGNOSIS — E7800 Pure hypercholesterolemia, unspecified: Secondary | ICD-10-CM | POA: Diagnosis not present

## 2020-01-28 DIAGNOSIS — I10 Essential (primary) hypertension: Secondary | ICD-10-CM | POA: Diagnosis not present

## 2020-02-03 ENCOUNTER — Ambulatory Visit (INDEPENDENT_AMBULATORY_CARE_PROVIDER_SITE_OTHER)
Admission: RE | Admit: 2020-02-03 | Discharge: 2020-02-03 | Disposition: A | Discharge status: Home / Self Care | Payer: Medicare Other | Source: Ambulatory Visit | Attending: Internal Medicine | Admitting: Internal Medicine

## 2020-02-03 ENCOUNTER — Other Ambulatory Visit: Payer: Self-pay

## 2020-02-03 DIAGNOSIS — R918 Other nonspecific abnormal finding of lung field: Secondary | ICD-10-CM

## 2020-02-03 DIAGNOSIS — J984 Other disorders of lung: Secondary | ICD-10-CM | POA: Diagnosis not present

## 2020-02-03 DIAGNOSIS — I700 Atherosclerosis of aorta: Secondary | ICD-10-CM | POA: Diagnosis not present

## 2020-02-03 DIAGNOSIS — R911 Solitary pulmonary nodule: Secondary | ICD-10-CM | POA: Diagnosis not present

## 2020-02-03 DIAGNOSIS — I2510 Atherosclerotic heart disease of native coronary artery without angina pectoris: Secondary | ICD-10-CM | POA: Diagnosis not present

## 2020-02-18 ENCOUNTER — Other Ambulatory Visit: Payer: Medicare Other

## 2020-02-24 ENCOUNTER — Other Ambulatory Visit: Payer: Self-pay | Admitting: Internal Medicine

## 2020-03-30 ENCOUNTER — Other Ambulatory Visit (INDEPENDENT_AMBULATORY_CARE_PROVIDER_SITE_OTHER): Payer: Medicare Other

## 2020-03-30 DIAGNOSIS — I2510 Atherosclerotic heart disease of native coronary artery without angina pectoris: Secondary | ICD-10-CM

## 2020-03-30 DIAGNOSIS — E785 Hyperlipidemia, unspecified: Secondary | ICD-10-CM

## 2020-03-30 DIAGNOSIS — I10 Essential (primary) hypertension: Secondary | ICD-10-CM | POA: Diagnosis not present

## 2020-03-30 DIAGNOSIS — I2583 Coronary atherosclerosis due to lipid rich plaque: Secondary | ICD-10-CM | POA: Diagnosis not present

## 2020-03-30 LAB — COMPREHENSIVE METABOLIC PANEL
ALT: 22 U/L (ref 0–35)
AST: 26 U/L (ref 0–37)
Albumin: 4.7 g/dL (ref 3.5–5.2)
Alkaline Phosphatase: 45 U/L (ref 39–117)
BUN: 10 mg/dL (ref 6–23)
CO2: 25 mEq/L (ref 19–32)
Calcium: 8.9 mg/dL (ref 8.4–10.5)
Chloride: 102 mEq/L (ref 96–112)
Creatinine, Ser: 0.93 mg/dL (ref 0.40–1.20)
GFR: 58.52 mL/min — ABNORMAL LOW (ref >60.00)
Glucose, Bld: 100 mg/dL — ABNORMAL HIGH (ref 70–99)
Potassium: 4.3 mEq/L (ref 3.5–5.1)
Sodium: 135 mEq/L (ref 135–145)
Total Bilirubin: 0.9 mg/dL (ref 0.2–1.2)
Total Protein: 7.4 g/dL (ref 6.0–8.3)

## 2020-03-30 LAB — LIPID PANEL
Cholesterol: 177 mg/dL (ref 0–200)
HDL: 44 mg/dL (ref >39.00)
LDL Cholesterol: 93 mg/dL (ref 0–99)
NonHDL: 132.66
Total CHOL/HDL Ratio: 4
Triglycerides: 199 mg/dL — ABNORMAL HIGH (ref 0.0–149.0)
VLDL: 39.8 mg/dL (ref 0.0–40.0)

## 2020-03-30 LAB — URINALYSIS, ROUTINE W REFLEX MICROSCOPIC
Bilirubin Urine: NEGATIVE
Ketones, ur: NEGATIVE
Nitrite: NEGATIVE
Specific Gravity, Urine: 1.015 (ref 1.000–1.030)
Total Protein, Urine: NEGATIVE
Urine Glucose: NEGATIVE
Urobilinogen, UA: 0.2 (ref 0.0–1.0)
pH: 7 (ref 5.0–8.0)

## 2020-03-30 LAB — CBC WITH DIFFERENTIAL/PLATELET
Basophils Absolute: 0.1 10*3/uL (ref 0.0–0.1)
Basophils Relative: 1 % (ref 0.0–3.0)
Eosinophils Absolute: 0.2 10*3/uL (ref 0.0–0.7)
Eosinophils Relative: 2.8 % (ref 0.0–5.0)
HCT: 41.8 % (ref 36.0–46.0)
Hemoglobin: 14.5 g/dL (ref 12.0–15.0)
Lymphocytes Relative: 18.4 % (ref 12.0–46.0)
Lymphs Abs: 1.1 10*3/uL (ref 0.7–4.0)
MCHC: 34.7 g/dL (ref 30.0–36.0)
MCV: 84.8 fl (ref 78.0–100.0)
Monocytes Absolute: 0.5 10*3/uL (ref 0.1–1.0)
Monocytes Relative: 7.4 % (ref 3.0–12.0)
Neutro Abs: 4.4 10*3/uL (ref 1.4–7.7)
Neutrophils Relative %: 70.4 % (ref 43.0–77.0)
Platelets: 172 10*3/uL (ref 150.0–400.0)
RBC: 4.93 Mil/uL (ref 3.87–5.11)
RDW: 13.4 % (ref 11.5–15.5)
WBC: 6.2 10*3/uL (ref 4.0–10.5)

## 2020-03-30 LAB — TSH: TSH: 11.76 u[IU]/mL — ABNORMAL HIGH (ref 0.35–4.50)

## 2020-04-01 ENCOUNTER — Other Ambulatory Visit (INDEPENDENT_AMBULATORY_CARE_PROVIDER_SITE_OTHER): Payer: Medicare Other

## 2020-04-01 DIAGNOSIS — E039 Hypothyroidism, unspecified: Secondary | ICD-10-CM

## 2020-04-01 LAB — T4, FREE: Free T4: 0.91 ng/dL (ref 0.60–1.60)

## 2020-04-06 ENCOUNTER — Other Ambulatory Visit: Payer: Self-pay

## 2020-04-07 ENCOUNTER — Ambulatory Visit (INDEPENDENT_AMBULATORY_CARE_PROVIDER_SITE_OTHER): Payer: Medicare Other | Admitting: Internal Medicine

## 2020-04-07 ENCOUNTER — Encounter: Payer: Self-pay | Admitting: Internal Medicine

## 2020-04-07 DIAGNOSIS — N3090 Cystitis, unspecified without hematuria: Secondary | ICD-10-CM | POA: Diagnosis not present

## 2020-04-07 DIAGNOSIS — F411 Generalized anxiety disorder: Secondary | ICD-10-CM

## 2020-04-07 DIAGNOSIS — E890 Postprocedural hypothyroidism: Secondary | ICD-10-CM

## 2020-04-07 DIAGNOSIS — I2583 Coronary atherosclerosis due to lipid rich plaque: Secondary | ICD-10-CM

## 2020-04-07 DIAGNOSIS — I2510 Atherosclerotic heart disease of native coronary artery without angina pectoris: Secondary | ICD-10-CM

## 2020-04-07 DIAGNOSIS — I10 Essential (primary) hypertension: Secondary | ICD-10-CM

## 2020-04-07 MED ORDER — CEFUROXIME AXETIL 250 MG PO TABS: 250.0000 mg | ORAL_TABLET | Freq: Two times a day (BID) | ORAL | 1 refills | Status: DC

## 2020-04-07 NOTE — Assessment & Plan Note (Addendum)
On Pravastatin 

## 2020-04-07 NOTE — Assessment & Plan Note (Signed)
On Atenolol 

## 2020-04-07 NOTE — Assessment & Plan Note (Signed)
On Prozac 

## 2020-04-07 NOTE — Progress Notes (Signed)
Subjective:  Patient ID: Lauren Mcgee, female    DOB: May 27, 1940  Age: 80 y.o. MRN: [id number]  CC: Follow-up (3 month f/u)   HPI Lauren Mcgee presents for anxiety - better, dyslipidemia - taking Pravastatin F/u HTN, abn TSH  Outpatient Medications Prior to Visit  Medication Sig Dispense Refill  . aspirin 81 MG chewable tablet Chew 81 mg by mouth daily.      Marland Kitchen atenolol (TENORMIN) 25 MG tablet TAKE 1 TABLET BY MOUTH EVERY DAY 90 tablet 3  . cholecalciferol (VITAMIN D) 1000 UNITS tablet Take 1,000 Units by mouth daily.      Marland Kitchen Cod Liver Oil 1000 MG CAPS Take 2 capsules by mouth daily.    . DENTA 5000 PLUS 1.1 % CREA dental cream     . FLUoxetine (PROZAC) 20 MG tablet TAKE 1 TABLET BY MOUTH EVERY DAY 90 tablet 1  . Omega-3 Fatty Acids (FISH OIL) 1000 MG CAPS Take 1-2 capsules by mouth daily.    Marland Kitchen omeprazole (PRILOSEC OTC) 20 MG tablet Take 20 mg by mouth daily.    . pravastatin (PRAVACHOL) 40 MG tablet Take 1 tablet (40 mg total) by mouth daily. 90 tablet 3  . saccharomyces boulardii (FLORASTOR) 250 MG capsule Take 1 capsule (250 mg total) by mouth daily as needed.    Marland Kitchen SYNTHROID 75 MCG tablet Take 1 tablet (75 mcg total) by mouth daily before breakfast. 90 tablet 3   No facility-administered medications prior to visit.    ROS: Review of Systems  Constitutional: Negative for activity change, appetite change, chills, fatigue and unexpected weight change.  HENT: Negative for congestion, mouth sores and sinus pressure.   Eyes: Negative for visual disturbance.  Respiratory: Negative for cough and chest tightness.   Gastrointestinal: Negative for abdominal pain and nausea.  Genitourinary: Negative for difficulty urinating, frequency and vaginal pain.  Musculoskeletal: Negative for back pain and gait problem.  Skin: Negative for pallor and rash.  Neurological: Negative for dizziness, tremors, weakness, numbness and headaches.  Psychiatric/Behavioral: Negative for confusion and sleep  disturbance.    Objective:  BP 120/62 (BP Location: Left Arm)   Pulse 62   Temp 98.2 F (36.8 C) (Oral)   Ht 5' 5.5" (1.664 m)   Wt 158 lb 9.6 oz (71.9 kg)   SpO2 97%   BMI 25.99 kg/m   BP Readings from Last 3 Encounters:  04/07/20 120/62  12/31/19 140/82  12/16/19 130/78    Wt Readings from Last 3 Encounters:  04/07/20 158 lb 9.6 oz (71.9 kg)  12/31/19 161 lb 12.8 oz (73.4 kg)  12/16/19 158 lb 12.8 oz (72 kg)    Physical Exam Constitutional:      General: She is not in acute distress.    Appearance: She is well-developed.  HENT:     Head: Normocephalic.     Right Ear: External ear normal.     Left Ear: External ear normal.     Nose: Nose normal.     Mouth/Throat:     Mouth: Oropharynx is clear and moist.  Eyes:     General:        Right eye: No discharge.        Left eye: No discharge.     Conjunctiva/sclera: Conjunctivae normal.     Pupils: Pupils are equal, round, and reactive to light.  Neck:     Thyroid: No thyromegaly.     Vascular: No JVD.     Trachea: No tracheal deviation.  Cardiovascular:     Rate and Rhythm: Normal rate and regular rhythm.     Heart sounds: Normal heart sounds.  Pulmonary:     Effort: No respiratory distress.     Breath sounds: No stridor. No wheezing.  Abdominal:     General: Bowel sounds are normal. There is no distension.     Palpations: Abdomen is soft. There is no mass.     Tenderness: There is no abdominal tenderness. There is no guarding or rebound.  Musculoskeletal:        General: No tenderness or edema.     Cervical back: Normal range of motion and neck supple.  Lymphadenopathy:     Cervical: No cervical adenopathy.  Skin:    Findings: No erythema or rash.  Neurological:     Cranial Nerves: No cranial nerve deficit.     Motor: No abnormal muscle tone.     Coordination: Coordination normal.     Deep Tendon Reflexes: Reflexes normal.  Psychiatric:        Mood and Affect: Mood and affect normal.        Behavior:  Behavior normal.        Thought Content: Thought content normal.        Judgment: Judgment normal.     Lab Results  Component Value Date   WBC 6.2 03/30/2020   HGB 14.5 03/30/2020   HCT 41.8 03/30/2020   PLT 172.0 03/30/2020   GLUCOSE 100 (H) 03/30/2020   CHOL 177 03/30/2020   TRIG 199.0 (H) 03/30/2020   HDL 44.00 03/30/2020   LDLDIRECT 158.1 11/24/2008   LDLCALC 93 03/30/2020   ALT 22 03/30/2020   AST 26 03/30/2020   NA 135 03/30/2020   K 4.3 03/30/2020   CL 102 03/30/2020   CREATININE 0.93 03/30/2020   BUN 10 03/30/2020   CO2 25 03/30/2020   TSH 11.76 (H) 03/30/2020   HGBA1C 5.5 05/05/2015    CT Chest Wo Contrast  Result Date: 02/03/2020 CLINICAL DATA:  Lung nodule follow-up. EXAM: CT CHEST WITHOUT CONTRAST TECHNIQUE: Multidetector CT imaging of the chest was performed following the standard protocol without IV contrast. COMPARISON:  CT chest dated June 27, 2019. FINDINGS: Cardiovascular: No significant vascular findings. Normal heart size. No pericardial effusion. No thoracic aortic aneurysm. Coronary, aortic arch, and branch vessel atherosclerotic vascular disease. Mediastinum/Nodes: No enlarged mediastinal or axillary lymph nodes. Thyroid gland, trachea, and esophagus demonstrate no significant findings. Lungs/Pleura: Unchanged slightly irregular 7 mm nodule in the superior segment of the right lower lobe (series 3, image 76), stable since October 2020. 4 mm ground-glass nodule in the right upper lobe (series 3, image 39) is also unchanged since the prior study. No new pulmonary nodule. No focal consolidation, pleural effusion, or pneumothorax. Unchanged mild biapical pleuroparenchymal scarring. Upper Abdomen: No acute abnormality. Musculoskeletal: No acute or significant osseous findings. Unchanged small mixed sclerotic and lucent lesion in the sternal body, stable since October 2020, benign. IMPRESSION: 1. Unchanged 7 mm nodule in the superior segment of the right lower lobe.  No further follow-up required. 2. Additional 4 mm ground-glass nodule in the right upper lobe is also unchanged since October 2020. No further follow-up recommended. 3. Aortic Atherosclerosis (ICD10-I70.0). Electronically Signed   By: Titus Dubin M.D.   On: 02/03/2020 16:59    Assessment & Plan:    Walker Kehr, MD

## 2020-04-07 NOTE — Assessment & Plan Note (Signed)
FT4 is nl Cont w/Synthroid Rx

## 2020-04-07 NOTE — Assessment & Plan Note (Signed)
Recurrent Ceftin po

## 2020-05-19 ENCOUNTER — Other Ambulatory Visit: Payer: Self-pay

## 2020-05-19 ENCOUNTER — Ambulatory Visit: Payer: Medicare Other | Admitting: Internal Medicine

## 2020-05-19 ENCOUNTER — Encounter: Payer: Self-pay | Admitting: Internal Medicine

## 2020-05-19 VITALS — BP 138/64 | HR 70 | Ht 65.5 in | Wt 157.0 lb

## 2020-05-19 DIAGNOSIS — K2100 Gastro-esophageal reflux disease with esophagitis, without bleeding: Secondary | ICD-10-CM | POA: Diagnosis not present

## 2020-05-19 DIAGNOSIS — N814 Uterovaginal prolapse, unspecified: Secondary | ICD-10-CM | POA: Diagnosis not present

## 2020-05-19 DIAGNOSIS — K580 Irritable bowel syndrome with diarrhea: Secondary | ICD-10-CM

## 2020-05-19 MED ORDER — LOPERAMIDE HCL 2 MG PO CAPS: 2.0000 mg | ORAL_CAPSULE | ORAL | 0 refills | Status: DC | PRN

## 2020-05-19 NOTE — Progress Notes (Signed)
Lauren Mcgee 80 y.o. [birth year] [id number]  Assessment & Plan:   Encounter Diagnoses  Name Primary?  . Irritable bowel syndrome with diarrhea Yes  . Gastroesophageal reflux disease with esophagitis without hemorrhage   . Cystocele with prolapse     Continue Imodium as needed to prevent urgent defecation.  It sounds like the pelvic floor physical therapy helped her as well.    Continue PPI therapy for GERD.  See me as needed. I appreciate the opportunity to care for this patient.   CC: Plotnikov, Evie Lacks, MD Dr. Dian Queen  Subjective:   Chief Complaint: Slightly painful swallowing, urgent defecation  HPI Lauren Mcgee is a 80 year old white woman here who says every once in a while she can feel her food passing down but she does not have overt dysphagia.  Her main concern is she takes Imodium AD intermittently when she goes out to eat or is traveling to prevent urgent defecation.  She has not had fecal incontinence but still have urgent stools, which could be soft.  She says her quality of life is excellent if she does this.  She went through pelvic floor physical therapy at the recommendation of Dr. Helane Rima.  The patient has a cystocele but I do not think she fully comprehends that she has a dropped bladder from our conversation. 07/2018 EGD - Benign-appearing esophageal stenosis. Dilated. - LA Grade A reflux esophagitis. - Multiple gastric polyps. Consistent with benign fundic gland polps as seen and biopsied 2008 - Erythematous mucosa in the prepyloric region of the stomach. - The examination was otherwise normal. - No specimens collected.  07/2016 colonoscopy - Two diminutive polyps in the transverse colon and in the cecum, removed with a cold snare. Resected and retrieved. - Diverticulosis in the sigmoid colon. - The examination was otherwise normal on direct and retroflexion views.  Allergies  Allergen Reactions  . Influenza Vaccines     Local reaction  . Lexapro  [Escitalopram]     Withdrawal sx's   Current Meds  Medication Sig  . aspirin 81 MG chewable tablet Chew 81 mg by mouth daily.  Marland Kitchen atenolol (TENORMIN) 25 MG tablet TAKE 1 TABLET BY MOUTH EVERY DAY  . cholecalciferol (VITAMIN D) 1000 UNITS tablet Take 1,000 Units by mouth daily.  . DENTA 5000 PLUS 1.1 % CREA dental cream   . FLUoxetine (PROZAC) 20 MG tablet TAKE 1 TABLET BY MOUTH EVERY DAY  . loperamide (IMODIUM) 2 MG capsule Take 1 capsule (2 mg total) by mouth as needed for diarrhea or loose stools.  Marland Kitchen omeprazole (PRILOSEC OTC) 20 MG tablet Take 20 mg by mouth daily.  . pravastatin (PRAVACHOL) 40 MG tablet Take 1 tablet (40 mg total) by mouth daily.  Marland Kitchen SYNTHROID 75 MCG tablet Take 1 tablet (75 mcg total) by mouth daily before breakfast.  . [DISCONTINUED] cefUROXime (CEFTIN) 250 MG tablet Take 1 tablet (250 mg total) by mouth 2 (two) times daily with a meal.  . [DISCONTINUED] Cod Liver Oil 1000 MG CAPS Take 2 capsules by mouth daily.  . [DISCONTINUED] Omega-3 Fatty Acids (FISH OIL) 1000 MG CAPS Take 1-2 capsules by mouth daily.  . [DISCONTINUED] saccharomyces boulardii (FLORASTOR) 250 MG capsule Take 1 capsule (250 mg total) by mouth daily as needed.   Past Medical History:  Diagnosis Date  . Diverticulosis of colon (without mention of hemorrhage)   . GERD (gastroesophageal reflux disease)   . Hx of colonic polyp 2001   TUBULAR ADENOMA  . Hyperlipidemia   .  Hypertension   . Hypothyroidism   . IBS (irritable bowel syndrome)   . PVD (peripheral vascular disease) ([place])   . Thyroid cancer ([place]) 2011   Past Surgical History:  Procedure Laterality Date  . ABDOMINAL HYSTERECTOMY  1991  . COLONOSCOPY  2010   562.10  . ESOPHAGOGASTRODUODENOSCOPY     multiple  . THYROIDECTOMY  2010   and 131I treatment at [facility] [place]- Dr. Celine Ahr   Social History   Social History Narrative   Retired   Married 2 kids   Regular exercise: not outside the home    Caffeine use: coffee daily   Former  smoker, no alcohol, no drugs   family history includes Colon cancer (age of onset: 79) in her son; Colon cancer (age of onset: 1) in her paternal aunt; Heart disease in her mother; Hyperlipidemia in her mother; Hypertension in an other family member.   Review of Systems  As per HPI Objective:   Physical Exam BP 138/64   Pulse 70   Ht 5' 5.5" (1.664 m)   Wt 157 lb (71.2 kg)   BMI 25.73 kg/m   Pleasant elderly white woman in no acute distress abdomen is soft nontender without organomegaly or mass

## 2020-05-19 NOTE — Patient Instructions (Signed)
It is ok to use Imodium AD as you are doing.  Stop fish oil and cod liver oil - not going to help you and may make stools loose.  I appreciate the opportunity to care for you. Gatha Mayer, MD, [facility]

## 2020-06-16 ENCOUNTER — Ambulatory Visit: Payer: Medicare Other | Admitting: Internal Medicine

## 2020-07-06 ENCOUNTER — Telehealth: Payer: Self-pay | Admitting: Internal Medicine

## 2020-07-06 NOTE — Telephone Encounter (Signed)
LVM for pt to rtn my call to schedule AWV with [facility]. Please schedule AWV if pt calls the office  

## 2020-07-14 ENCOUNTER — Ambulatory Visit: Payer: Medicare Other

## 2020-07-23 ENCOUNTER — Other Ambulatory Visit: Payer: Self-pay

## 2020-07-23 ENCOUNTER — Ambulatory Visit (INDEPENDENT_AMBULATORY_CARE_PROVIDER_SITE_OTHER): Payer: Medicare Other

## 2020-07-23 VITALS — BP 118/78 | HR 76 | Temp 97.6°F | Resp 16 | Ht 66.0 in | Wt 157.6 lb

## 2020-07-23 DIAGNOSIS — Z0000 Encounter for general adult medical examination without abnormal findings: Secondary | ICD-10-CM

## 2020-07-23 NOTE — Progress Notes (Addendum)
Subjective:   Lauren Mcgee is a 80 y.o. female who presents for Medicare Annual (Subsequent) preventive examination.  Review of Systems    No ROS. Medicare Wellness Visit. Additional risk factors are reflected in social history. Cardiac Risk Factors include: advanced age (>46men, >86 women);dyslipidemia;family history of premature cardiovascular disease;hypertension  Sleep Patterns: No sleep issues, feels rested on waking and sleeps 6-8 hours nightly. Home Safety/Smoke Alarms: Feels safe in home; uses home alarm. Smoke alarms in place. Living environment: 2-story home; Lives with spouse; no needs for DME; good support system. Seat Belt Safety/Bike Helmet: Wears seat belt.    Objective:    Today's Vitals   07/23/20 1231  BP: 118/78  Pulse: 76  Resp: 16  Temp: 97.6 F (36.4 C)  SpO2: 94%  Weight: 157 lb 9.6 oz (71.5 kg)  Height: 5\' 6"  (1.676 m)  PainSc: 0-No pain   Body mass index is 25.44 kg/m.  Advanced Directives 07/23/2020 08/18/2019 10/04/2016  Does Patient Have a Medical Advance Directive? Yes No No  Type of Advance Directive [place] - -  Does patient want to make changes to medical advance directive? No - Patient declined - -  Copy of [place] in Chart? No - copy requested - -  Would patient like information on creating a medical advance directive? - No - Patient declined -    Current Medications (verified) Outpatient Encounter Medications as of 07/23/2020  Medication Sig   aspirin 81 MG chewable tablet Chew 81 mg by mouth daily.   atenolol (TENORMIN) 25 MG tablet TAKE 1 TABLET BY MOUTH EVERY DAY   cholecalciferol (VITAMIN D) 1000 UNITS tablet Take 1,000 Units by mouth daily.   DENTA 5000 PLUS 1.1 % CREA dental cream    FLUoxetine (PROZAC) 20 MG tablet TAKE 1 TABLET BY MOUTH EVERY DAY   loperamide (IMODIUM) 2 MG capsule Take 1 capsule (2 mg total) by mouth as needed for diarrhea or loose stools.   omeprazole (PRILOSEC OTC)  20 MG tablet Take 20 mg by mouth daily.   pravastatin (PRAVACHOL) 40 MG tablet Take 1 tablet (40 mg total) by mouth daily.   SYNTHROID 75 MCG tablet Take 1 tablet (75 mcg total) by mouth daily before breakfast.   No facility-administered encounter medications on file as of 07/23/2020.    Allergies (verified) Influenza vaccines and Lexapro [escitalopram]   History: Past Medical History:  Diagnosis Date   Diverticulosis of colon (without mention of hemorrhage)    GERD (gastroesophageal reflux disease)    Hx of colonic polyp 2001   TUBULAR ADENOMA   Hyperlipidemia    Hypertension    Hypothyroidism    IBS (irritable bowel syndrome)    PVD (peripheral vascular disease) ([place])    Thyroid cancer ([place]) 2011   Past Surgical History:  Procedure Laterality Date   ABDOMINAL HYSTERECTOMY  1991   COLONOSCOPY  2010   562.10   ESOPHAGOGASTRODUODENOSCOPY     multiple   THYROIDECTOMY  2010   and 131I treatment at [facility]- Dr. Celine Ahr   Family History  Problem Relation Age of Onset   Hyperlipidemia Mother    Heart disease Mother    Hypertension Other    Colon cancer Paternal Aunt 78   Colon cancer Son 79   Colon polyps Neg Hx    Liver cancer Neg Hx    Pancreatic cancer Neg Hx    Rectal cancer Neg Hx    Stomach cancer Neg Hx  Social History   Socioeconomic History   Marital status: Married    Spouse name: Not on file   Number of children: 2   Years of education: Not on file   Highest education level: Not on file  Occupational History   Occupation: retired  Tobacco Use   Smoking status: Former Smoker    Packs/day: 0.50    Types: Cigarettes    Quit date: 03/06/1958    Years since quitting: 62.4   Smokeless tobacco: Never Used  Vaping Use   Vaping Use: Never used  Substance and Sexual Activity   Alcohol use: No   Drug use: No   Sexual activity: Yes  Other Topics Concern   Not on file  Social History Narrative   Retired   Married 2 kids   Regular exercise: not outside  the home    Caffeine use: coffee daily   Former smoker, no alcohol, no drugs   Social Determinants of Radio broadcast assistant Strain: Low Risk    Difficulty of Paying Living Expenses: Not hard at all  Food Insecurity: No Food Insecurity   Worried About Charity fundraiser in the Last Year: Never true   Arboriculturist in the Last Year: Never true  Transportation Needs: No Transportation Needs   Lack of Transportation (Medical): No   Lack of Transportation (Non-Medical): No  Physical Activity: Sufficiently Active   Days of Exercise per Week: 5 days   Minutes of Exercise per Session: 30 min  Stress: No Stress Concern Present   Feeling of Stress : Not at all  Social Connections: Socially Integrated   Frequency of Communication with Friends and Family: More than three times a week   Frequency of Social Gatherings with Friends and Family: Twice a week   Attends Religious Services: More than 4 times per year   Active Member of [employer] or Organizations: Yes   Attends Music therapist: More than 4 times per year   Marital Status: Married    Tobacco Counseling Counseling given: Not Answered   Clinical Intake:  Pre-visit preparation completed: Yes  Pain : No/denies pain Pain Score: 0-No pain     BMI - recorded: 25.44 Nutritional Status: BMI 25 -29 Overweight Nutritional Risks: None Diabetes: No  How often do you need to have someone help you when you read instructions, pamphlets, or other written materials from your doctor or pharmacy?: 1 - Never What is the last grade level you completed in school?: College Graduate  Diabetic? no  Interpreter Needed?: No  Information entered by :: Lisette Abu, LPN   Activities of Daily Living In your present state of health, do you have any difficulty performing the following activities: 07/23/2020  Hearing? N  Vision? N  Difficulty concentrating or making decisions? N  Walking or climbing stairs? N  Dressing or  bathing? N  Doing errands, shopping? N  Preparing Food and eating ? N  Using the Toilet? N  In the past six months, have you accidently leaked urine? N  Do you have problems with loss of bowel control? N  Managing your Medications? N  Managing your Finances? N  Housekeeping or managing your Housekeeping? N  Some recent data might be hidden    Patient Care Team: Plotnikov, Evie Lacks, MD as PCP - General Philemon Kingdom, MD as Consulting Physician (Internal Medicine) Darleen Crocker, MD as Consulting Physician (Ophthalmology)  Indicate any recent Medical Services you may have received from other than  [facility] providers in the past year (date may be approximate).     Assessment:   This is a routine wellness examination for [place].  Hearing/Vision screen No exam data present  Dietary issues and exercise activities discussed: Current Exercise Habits: Home exercise routine (Exercises inside the house), Type of exercise: walking, Time (Minutes): 30, Frequency (Times/Week): 5, Weekly Exercise (Minutes/Week): 150, Intensity: Moderate, Exercise limited by: None identified  Goals Addressed             This Visit's Progress    Patient Stated       Continue to keep living and having birthdays.       Depression Screen PHQ 2/9 Scores 07/23/2020 04/07/2020 01/13/2019 08/15/2016 05/05/2015 05/04/2014  PHQ - 2 Score 0 0 0 0 0 0  PHQ- 9 Score - - - 0 - -    Fall Risk Fall Risk  07/23/2020 04/07/2020 08/15/2016 05/05/2015 05/04/2014  Falls in the past year? 0 0 No No No  Number falls in past yr: 0 0 - - -  Injury with Fall? 0 0 - - -  Risk for fall due to : No Fall Risks No Fall Risks - - -  Follow up Falls evaluation completed - - - -    FALL RISK PREVENTION PERTAINING TO THE HOME:  Any stairs in or around the home? Yes  If so, are there any without handrails? No  Home free of loose throw rugs in walkways, pet beds, electrical cords, etc? Yes  Adequate lighting in your home to reduce risk of  falls? Yes   ASSISTIVE DEVICES UTILIZED TO PREVENT FALLS:  Life alert? No  Use of a cane, walker or w/c? No  Grab bars in the bathroom? Yes  Shower chair or bench in shower? No  Elevated toilet seat or a handicapped toilet? No   TIMED UP AND GO:  Was the test performed? No .  Length of time to ambulate 10 feet: 0 sec.   Gait steady and fast without use of assistive device  Cognitive Function: Normal cognitive status assessed by direct observation by this Nurse Health Advisor. No abnormalities found.          Immunizations Immunization History  Administered Date(s) Administered   Fluad Quad(high Dose 65+) 11/19/2018, 12/16/2019   Influenza Whole 11/26/2007   Influenza, High Dose Seasonal PF 12/06/2017   Influenza,inj,Quad PF,6+ Mos 12/06/2016   Moderna SARS-COV2 Booster Vaccination 01/02/2020   Moderna Sars-Covid-2 Vaccination 03/21/2019, 04/18/2019   Pneumococcal Conjugate-13 10/08/2013   Pneumococcal Polysaccharide-23 11/24/2008   Zoster 11/10/2010    TDAP status: Up to date  Flu Vaccine status: Up to date  Pneumococcal vaccine status: Up to date  Covid-19 vaccine status: Completed vaccines  Qualifies for Shingles Vaccine? Yes   Zostavax completed Yes   Shingrix Completed?: No.    Education has been provided regarding the importance of this vaccine. Patient has been advised to call insurance company to determine out of pocket expense if they have not yet received this vaccine. Advised may also receive vaccine at local pharmacy or Health Dept. Verbalized acceptance and understanding.  Screening Tests Health Maintenance  Topic Date Due   Hepatitis C Screening  Never done   TETANUS/TDAP  Never done   DEXA SCAN  Never done   INFLUENZA VACCINE  10/04/2020   COVID-19 Vaccine  Completed   PNA vac Low Risk Adult  Completed   HPV VACCINES  Aged Out    Health Maintenance  Health Maintenance Due  Topic Date Due   Hepatitis C Screening  Never done   TETANUS/TDAP   Never done   DEXA SCAN  Never done    Colorectal cancer screening: No longer required.   Mammogram status: Completed 01/16/2020. Repeat every year  Bone Density status: no record  Lung Cancer Screening: (Low Dose CT Chest recommended if Age 4-80 years, 30 pack-year currently smoking OR have quit w/in 15years.) does not qualify.   Lung Cancer Screening Referral: no  Additional Screening:  Hepatitis C Screening: does qualify; Completed no  Vision Screening: Recommended annual ophthalmology exams for early detection of glaucoma and other disorders of the eye. Is the patient up to date with their annual eye exam?  Yes  Who is the provider or what is the name of the office in which the patient attends annual eye exams? Mia Creek, MD If pt is not established with a provider, would they like to be referred to a provider to establish care? No .   Dental Screening: Recommended annual dental exams for proper oral hygiene  Community Resource Referral / Chronic Care Management: CRR required this visit?  No   CCM required this visit?  No      Plan:     I have personally reviewed and noted the following in the patient's chart:   Medical and social history Use of alcohol, tobacco or illicit drugs  Current medications and supplements including opioid prescriptions.  Functional ability and status Nutritional status Physical activity Advanced directives List of other physicians Hospitalizations, surgeries, and ER visits in previous 12 months Vitals Screenings to include cognitive, depression, and falls Referrals and appointments  In addition, I have reviewed and discussed with patient certain preventive protocols, quality metrics, and best practice recommendations. A written personalized care plan for preventive services as well as general preventive health recommendations were provided to patient.     Mickeal Needy, LPN   0/[id number]/1191   Nurse Notes: n/a  Medical  screening examination/treatment/procedure(s) were performed by non-physician practitioner and as supervising physician I was immediately available for consultation/collaboration.  I agree with above. Jacinta Shoe, MD

## 2020-07-23 NOTE — Patient Instructions (Addendum)
Lauren Mcgee , Thank you for taking time to come for your Medicare Wellness Visit. I appreciate your ongoing commitment to your health goals. Please review the following plan we discussed and let me know if I can assist you in the future.   Screening recommendations/referrals: Colonoscopy: 10/18/2016; no repeat due to age 80: 01/16/2020 Recommended yearly ophthalmology/optometry visit for glaucoma screening and checkup Recommended yearly dental visit for hygiene and checkup  Vaccinations: Influenza vaccine: 12/16/2019 Pneumococcal vaccine: 11/24/2008, 10/08/2013 Tdap vaccine: 06/04/2012; due every 10 years Shingles vaccine:Please call your insurance company to determine your out of pocket expense for the Shingrix vaccine. You may receive this vaccine at your local pharmacy.   Covid-19: 03/21/2019, 04/18/2019, 01/02/2020, 07/23/2020  Advanced directives: Please bring a copy of your health care power of attorney and living will to the office at your convenience.  Conditions/risks identified: Yes; Reviewed health maintenance screenings with patient today and relevant education, vaccines, and/or referrals were provided. Please continue to do your personal lifestyle choices by: daily care of teeth and gums, regular physical activity (goal should be 5 days a week for 30 minutes), eat a healthy diet, avoid tobacco and drug use, limiting any alcohol intake, taking a low-dose aspirin (if not allergic or have been advised by your provider otherwise) and taking vitamins and minerals as recommended by your provider. Continue doing brain stimulating activities (puzzles, reading, adult coloring books, staying active) to keep memory sharp. Continue to eat heart healthy diet (full of fruits, vegetables, whole grains, lean protein, water--limit salt, fat, and sugar intake) and increase physical activity as tolerated.  Next appointment: Please schedule your next Medicare Wellness Visit with your Nurse Health Advisor  in 1 year by calling [phone].   Preventive Care 21 Years and Older, Female Preventive care refers to lifestyle choices and visits with your health care provider that can promote health and wellness. What does preventive care include?  A yearly physical exam. This is also called an annual well check.  Dental exams once or twice a year.  Routine eye exams. Ask your health care provider how often you should have your eyes checked.  Personal lifestyle choices, including:  Daily care of your teeth and gums.  Regular physical activity.  Eating a healthy diet.  Avoiding tobacco and drug use.  Limiting alcohol use.  Practicing safe sex.  Taking low-dose aspirin every day.  Taking vitamin and mineral supplements as recommended by your health care provider. What happens during an annual well check? The services and screenings done by your health care provider during your annual well check will depend on your age, overall health, lifestyle risk factors, and family history of disease. Counseling  Your health care provider may ask you questions about your:  Alcohol use.  Tobacco use.  Drug use.  Emotional well-being.  Home and relationship well-being.  Sexual activity.  Eating habits.  History of falls.  Memory and ability to understand (cognition).  Work and work Statistician.  Reproductive health. Screening  You may have the following tests or measurements:  Height, weight, and BMI.  Blood pressure.  Lipid and cholesterol levels. These may be checked every 5 years, or more frequently if you are over 19 years old.  Skin check.  Lung cancer screening. You may have this screening every year starting at age 43 if you have a 30-pack-year history of smoking and currently smoke or have quit within the past 15 years.  Fecal occult blood test (FOBT) of the stool. You may have  this test every year starting at age 3.  Flexible sigmoidoscopy or colonoscopy. You  may have a sigmoidoscopy every 5 years or a colonoscopy every 10 years starting at age 66.  Hepatitis C blood test.  Hepatitis B blood test.  Sexually transmitted disease (STD) testing.  Diabetes screening. This is done by checking your blood sugar (glucose) after you have not eaten for a while (fasting). You may have this done every 1-3 years.  Bone density scan. This is done to screen for osteoporosis. You may have this done starting at age 80.  Mammogram. This may be done every 1-2 years. Talk to your health care provider about how often you should have regular mammograms. Talk with your health care provider about your test results, treatment options, and if necessary, the need for more tests. Vaccines  Your health care provider may recommend certain vaccines, such as:  Influenza vaccine. This is recommended every year.  Tetanus, diphtheria, and acellular pertussis (Tdap, Td) vaccine. You may need a Td booster every 10 years.  Zoster vaccine. You may need this after age 73.  Pneumococcal 13-valent conjugate (PCV13) vaccine. One dose is recommended after age 74.  Pneumococcal polysaccharide (PPSV23) vaccine. One dose is recommended after age 41. Talk to your health care provider about which screenings and vaccines you need and how often you need them. This information is not intended to replace advice given to you by your health care provider. Make sure you discuss any questions you have with your health care provider. Document Released: 03/19/2015 Document Revised: 11/10/2015 Document Reviewed: 12/22/2014 Elsevier Interactive Patient Education  2017 [place] Prevention in the Home Falls can cause injuries. They can happen to people of all ages. There are many things you can do to make your home safe and to help prevent falls. What can I do on the outside of my home?  Regularly fix the edges of walkways and driveways and fix any cracks.  Remove anything that might  make you trip as you walk through a door, such as a raised step or threshold.  Trim any bushes or trees on the path to your home.  Use bright outdoor lighting.  Clear any walking paths of anything that might make someone trip, such as rocks or tools.  Regularly check to see if handrails are loose or broken. Make sure that both sides of any steps have handrails.  Any raised decks and porches should have guardrails on the edges.  Have any leaves, snow, or ice cleared regularly.  Use sand or salt on walking paths during winter.  Clean up any spills in your garage right away. This includes oil or grease spills. What can I do in the bathroom?  Use night lights.  Install grab bars by the toilet and in the tub and shower. Do not use towel bars as grab bars.  Use non-skid mats or decals in the tub or shower.  If you need to sit down in the shower, use a plastic, non-slip stool.  Keep the floor dry. Clean up any water that spills on the floor as soon as it happens.  Remove soap buildup in the tub or shower regularly.  Attach bath mats securely with double-sided non-slip rug tape.  Do not have throw rugs and other things on the floor that can make you trip. What can I do in the bedroom?  Use night lights.  Make sure that you have a light by your bed that is easy to  reach.  Do not use any sheets or blankets that are too big for your bed. They should not hang down onto the floor.  Have a firm chair that has side arms. You can use this for support while you get dressed.  Do not have throw rugs and other things on the floor that can make you trip. What can I do in the kitchen?  Clean up any spills right away.  Avoid walking on wet floors.  Keep items that you use a lot in easy-to-reach places.  If you need to reach something above you, use a strong step stool that has a grab bar.  Keep electrical cords out of the way.  Do not use floor polish or wax that makes floors  slippery. If you must use wax, use non-skid floor wax.  Do not have throw rugs and other things on the floor that can make you trip. What can I do with my stairs?  Do not leave any items on the stairs.  Make sure that there are handrails on both sides of the stairs and use them. Fix handrails that are broken or loose. Make sure that handrails are as long as the stairways.  Check any carpeting to make sure that it is firmly attached to the stairs. Fix any carpet that is loose or worn.  Avoid having throw rugs at the top or bottom of the stairs. If you do have throw rugs, attach them to the floor with carpet tape.  Make sure that you have a light switch at the top of the stairs and the bottom of the stairs. If you do not have them, ask someone to add them for you. What else can I do to help prevent falls?  Wear shoes that:  Do not have high heels.  Have rubber bottoms.  Are comfortable and fit you well.  Are closed at the toe. Do not wear sandals.  If you use a stepladder:  Make sure that it is fully opened. Do not climb a closed stepladder.  Make sure that both sides of the stepladder are locked into place.  Ask someone to hold it for you, if possible.  Clearly mark and make sure that you can see:  Any grab bars or handrails.  First and last steps.  Where the edge of each step is.  Use tools that help you move around (mobility aids) if they are needed. These include:  Canes.  Walkers.  Scooters.  Crutches.  Turn on the lights when you go into a dark area. Replace any light bulbs as soon as they burn out.  Set up your furniture so you have a clear path. Avoid moving your furniture around.  If any of your floors are uneven, fix them.  If there are any pets around you, be aware of where they are.  Review your medicines with your doctor. Some medicines can make you feel dizzy. This can increase your chance of falling. Ask your doctor what other things that you  can do to help prevent falls. This information is not intended to replace advice given to you by your health care provider. Make sure you discuss any questions you have with your health care provider. Document Released: 12/17/2008 Document Revised: 07/29/2015 Document Reviewed: 03/27/2014 Elsevier Interactive Patient Education  2017 [employer].

## 2020-08-14 ENCOUNTER — Other Ambulatory Visit: Payer: Self-pay | Admitting: Internal Medicine

## 2020-08-16 ENCOUNTER — Other Ambulatory Visit: Payer: Self-pay | Admitting: Internal Medicine

## 2020-08-22 ENCOUNTER — Other Ambulatory Visit: Payer: Self-pay | Admitting: Internal Medicine

## 2020-08-30 DIAGNOSIS — R300 Dysuria: Secondary | ICD-10-CM | POA: Diagnosis not present

## 2020-09-06 DIAGNOSIS — N3001 Acute cystitis with hematuria: Secondary | ICD-10-CM | POA: Diagnosis not present

## 2020-09-15 ENCOUNTER — Other Ambulatory Visit: Payer: Self-pay

## 2020-09-15 ENCOUNTER — Ambulatory Visit (INDEPENDENT_AMBULATORY_CARE_PROVIDER_SITE_OTHER): Payer: Medicare Other | Admitting: Internal Medicine

## 2020-09-15 ENCOUNTER — Encounter: Payer: Self-pay | Admitting: Internal Medicine

## 2020-09-15 VITALS — BP 132/80 | HR 65 | Temp 98.3°F | Ht 65.5 in | Wt 153.8 lb

## 2020-09-15 DIAGNOSIS — E785 Hyperlipidemia, unspecified: Secondary | ICD-10-CM

## 2020-09-15 DIAGNOSIS — R202 Paresthesia of skin: Secondary | ICD-10-CM | POA: Diagnosis not present

## 2020-09-15 DIAGNOSIS — I2583 Coronary atherosclerosis due to lipid rich plaque: Secondary | ICD-10-CM | POA: Diagnosis not present

## 2020-09-15 DIAGNOSIS — R739 Hyperglycemia, unspecified: Secondary | ICD-10-CM | POA: Diagnosis not present

## 2020-09-15 DIAGNOSIS — Z23 Encounter for immunization: Secondary | ICD-10-CM

## 2020-09-15 DIAGNOSIS — I10 Essential (primary) hypertension: Secondary | ICD-10-CM

## 2020-09-15 DIAGNOSIS — I2510 Atherosclerotic heart disease of native coronary artery without angina pectoris: Secondary | ICD-10-CM

## 2020-09-15 DIAGNOSIS — E538 Deficiency of other specified B group vitamins: Secondary | ICD-10-CM | POA: Diagnosis not present

## 2020-09-15 LAB — COMPREHENSIVE METABOLIC PANEL
ALT: 20 U/L (ref 0–35)
AST: 27 U/L (ref 0–37)
Albumin: 4.6 g/dL (ref 3.5–5.2)
Alkaline Phosphatase: 42 U/L (ref 39–117)
BUN: 14 mg/dL (ref 6–23)
CO2: 24 mEq/L (ref 19–32)
Calcium: 9.2 mg/dL (ref 8.4–10.5)
Chloride: 102 mEq/L (ref 96–112)
Creatinine, Ser: 1 mg/dL (ref 0.40–1.20)
GFR: 53.46 mL/min — ABNORMAL LOW (ref >60.00)
Glucose, Bld: 101 mg/dL — ABNORMAL HIGH (ref 70–99)
Potassium: 4.7 mEq/L (ref 3.5–5.1)
Sodium: 133 mEq/L — ABNORMAL LOW (ref 135–145)
Total Bilirubin: 0.9 mg/dL (ref 0.2–1.2)
Total Protein: 7.2 g/dL (ref 6.0–8.3)

## 2020-09-15 LAB — VITAMIN B12: Vitamin B-12: 136 pg/mL — ABNORMAL LOW (ref 211–911)

## 2020-09-15 LAB — HEMOGLOBIN A1C: Hgb A1c MFr Bld: 5.7 % (ref 4.6–6.5)

## 2020-09-15 LAB — TSH: TSH: 5.62 u[IU]/mL — ABNORMAL HIGH (ref 0.35–5.50)

## 2020-09-15 MED ORDER — FISH OIL 1000 MG PO CAPS: ORAL_CAPSULE | ORAL | 3 refills | Status: AC

## 2020-09-15 NOTE — Addendum Note (Signed)
Addended by: Earnstine Regal on: 09/15/2020 11:48 AM   Modules accepted: Orders

## 2020-09-15 NOTE — Assessment & Plan Note (Signed)
D/c Pravachol Start fish oil or krill oil

## 2020-09-15 NOTE — Assessment & Plan Note (Signed)
Check A1c. 

## 2020-09-15 NOTE — Progress Notes (Signed)
Subjective:  Patient ID: Lauren Mcgee, female    DOB: [birth year]  Age: 80 y.o. MRN: [id number]  CC: Dizziness   HPI Lauren Mcgee presents for B feet numbness - mild x 6 months C/o dyslipidemia - balance is off on a statin medication. No falls C/o anxiety - on Prozac  Outpatient Medications Prior to Visit  Medication Sig Dispense Refill   aspirin 81 MG chewable tablet Chew 81 mg by mouth daily.     atenolol (TENORMIN) 25 MG tablet TAKE 1 TABLET BY MOUTH EVERY DAY 90 tablet 3   cholecalciferol (VITAMIN D) 1000 UNITS tablet Take 1,000 Units by mouth daily.     DENTA 5000 PLUS 1.1 % CREA dental cream      FLUoxetine (PROZAC) 20 MG capsule Take 1 capsule (20 mg total) by mouth daily. 90 capsule 3   loperamide (IMODIUM) 2 MG capsule Take 1 capsule (2 mg total) by mouth as needed for diarrhea or loose stools. 30 capsule 0   omeprazole (PRILOSEC OTC) 20 MG tablet Take 20 mg by mouth daily.     pravastatin (PRAVACHOL) 40 MG tablet Take 1 tablet (40 mg total) by mouth daily. 90 tablet 3   SYNTHROID 75 MCG tablet TAKE 1 TABLET (75 MCG TOTAL) BY MOUTH DAILY BEFORE BREAKFAST. 30 tablet 0   No facility-administered medications prior to visit.    ROS: Review of Systems  Constitutional:  Negative for activity change, appetite change, chills, fatigue and unexpected weight change.  HENT:  Negative for congestion, mouth sores and sinus pressure.   Eyes:  Negative for visual disturbance.  Respiratory:  Negative for cough and chest tightness.   Gastrointestinal:  Negative for abdominal pain and nausea.  Genitourinary:  Negative for difficulty urinating, frequency and vaginal pain.  Musculoskeletal:  Positive for myalgias. Negative for back pain and gait problem.  Skin:  Negative for pallor and rash.  Neurological:  Positive for numbness. Negative for dizziness, tremors, weakness and headaches.  Psychiatric/Behavioral:  Negative for confusion and sleep disturbance. The patient is nervous/anxious.     Objective:  BP 132/80 (BP Location: Left Arm)   Pulse 65   Temp 98.3 F (36.8 C) (Oral)   Ht 5' 5.5" (1.664 m)   Wt 153 lb 12.8 oz (69.8 kg)   SpO2 95%   BMI 25.20 kg/m   BP Readings from Last 3 Encounters:  09/15/20 132/80  07/23/20 118/78  05/19/20 138/64    Wt Readings from Last 3 Encounters:  09/15/20 153 lb 12.8 oz (69.8 kg)  07/23/20 157 lb 9.6 oz (71.5 kg)  05/19/20 157 lb (71.2 kg)    Physical Exam Constitutional:      General: She is not in acute distress.    Appearance: She is well-developed.  HENT:     Head: Normocephalic.     Right Ear: External ear normal.     Left Ear: External ear normal.     Nose: Nose normal.  Eyes:     General:        Right eye: No discharge.        Left eye: No discharge.     Conjunctiva/sclera: Conjunctivae normal.     Pupils: Pupils are equal, round, and reactive to light.  Neck:     Thyroid: No thyromegaly.     Vascular: No JVD.     Trachea: No tracheal deviation.  Cardiovascular:     Rate and Rhythm: Normal rate and regular rhythm.     Heart  sounds: Normal heart sounds.  Pulmonary:     Effort: No respiratory distress.     Breath sounds: No stridor. No wheezing.  Abdominal:     General: Bowel sounds are normal. There is no distension.     Palpations: Abdomen is soft. There is no mass.     Tenderness: There is no abdominal tenderness. There is no guarding or rebound.  Musculoskeletal:        General: Tenderness present.     Cervical back: Normal range of motion and neck supple. No rigidity.  Lymphadenopathy:     Cervical: No cervical adenopathy.  Skin:    Findings: No erythema or rash.  Neurological:     Mental Status: She is oriented to person, place, and time.     Cranial Nerves: No cranial nerve deficit.     Motor: No abnormal muscle tone.     Coordination: Coordination normal.     Deep Tendon Reflexes: Reflexes normal.  Psychiatric:        Behavior: Behavior normal.        Thought Content: Thought content  normal.        Judgment: Judgment normal.    Lab Results  Component Value Date   WBC 6.2 03/30/2020   HGB 14.5 03/30/2020   HCT 41.8 03/30/2020   PLT 172.0 03/30/2020   GLUCOSE 100 (H) 03/30/2020   CHOL 177 03/30/2020   TRIG 199.0 (H) 03/30/2020   HDL 44.00 03/30/2020   LDLDIRECT 158.1 11/24/2008   LDLCALC 93 03/30/2020   ALT 22 03/30/2020   AST 26 03/30/2020   NA 135 03/30/2020   K 4.3 03/30/2020   CL 102 03/30/2020   CREATININE 0.93 03/30/2020   BUN 10 03/30/2020   CO2 25 03/30/2020   TSH 11.76 (H) 03/30/2020   HGBA1C 5.5 05/05/2015    CT Chest Wo Contrast  Result Date: 02/03/2020 CLINICAL DATA:  Lung nodule follow-up. EXAM: CT CHEST WITHOUT CONTRAST TECHNIQUE: Multidetector CT imaging of the chest was performed following the standard protocol without IV contrast. COMPARISON:  CT chest dated June 27, 2019. FINDINGS: Cardiovascular: No significant vascular findings. Normal heart size. No pericardial effusion. No thoracic aortic aneurysm. Coronary, aortic arch, and branch vessel atherosclerotic vascular disease. Mediastinum/Nodes: No enlarged mediastinal or axillary lymph nodes. Thyroid gland, trachea, and esophagus demonstrate no significant findings. Lungs/Pleura: Unchanged slightly irregular 7 mm nodule in the superior segment of the right lower lobe (series 3, image 76), stable since October 2020. 4 mm ground-glass nodule in the right upper lobe (series 3, image 39) is also unchanged since the prior study. No new pulmonary nodule. No focal consolidation, pleural effusion, or pneumothorax. Unchanged mild biapical pleuroparenchymal scarring. Upper Abdomen: No acute abnormality. Musculoskeletal: No acute or significant osseous findings. Unchanged small mixed sclerotic and lucent lesion in the sternal body, stable since October 2020, benign. IMPRESSION: 1. Unchanged 7 mm nodule in the superior segment of the right lower lobe. No further follow-up required. 2. Additional 4 mm  ground-glass nodule in the right upper lobe is also unchanged since October 2020. No further follow-up recommended. 3. Aortic Atherosclerosis (ICD10-I70.0). Electronically Signed   By: Titus Dubin M.D.   On: 02/03/2020 16:59    Assessment & Plan:    Walker Kehr, MD

## 2020-09-15 NOTE — Assessment & Plan Note (Signed)
Hold Pravastatin due to multiple complaints Start fish oil

## 2020-09-15 NOTE — Addendum Note (Signed)
Addended by: Jacobo Forest on: 09/15/2020 11:33 AM   Modules accepted: Orders

## 2020-09-15 NOTE — Assessment & Plan Note (Signed)
Start fish oil or krill oil D/c statin Check TSH, B12

## 2020-09-15 NOTE — Assessment & Plan Note (Signed)
Cont w/Atenolol

## 2020-09-15 NOTE — Patient Instructions (Addendum)
Stop Pravastatin  [facility]

## 2020-09-19 DIAGNOSIS — E538 Deficiency of other specified B group vitamins: Secondary | ICD-10-CM | POA: Insufficient documentation

## 2020-09-19 NOTE — Assessment & Plan Note (Signed)
New.  Will start on B12 injections.

## 2020-09-23 ENCOUNTER — Other Ambulatory Visit: Payer: Self-pay

## 2020-09-23 ENCOUNTER — Ambulatory Visit (INDEPENDENT_AMBULATORY_CARE_PROVIDER_SITE_OTHER): Payer: Medicare Other | Admitting: Internal Medicine

## 2020-09-23 ENCOUNTER — Encounter: Payer: Self-pay | Admitting: Internal Medicine

## 2020-09-23 VITALS — BP 124/72 | HR 65 | Temp 98.1°F | Ht 65.5 in | Wt 157.6 lb

## 2020-09-23 DIAGNOSIS — E538 Deficiency of other specified B group vitamins: Secondary | ICD-10-CM | POA: Diagnosis not present

## 2020-09-23 MED ORDER — CYANOCOBALAMIN 1000 MCG/ML IJ SOLN: INTRAMUSCULAR | 5 refills | Status: DC

## 2020-09-23 MED ORDER — "BD ECLIPSE SYRINGE 25G X 1"" 3 ML MISC": 3 refills | Status: DC

## 2020-09-23 MED ORDER — VITAMIN B-12 1000 MCG SL SUBL: 1.0000 | SUBLINGUAL_TABLET | Freq: Every day | SUBLINGUAL | 3 refills | Status: DC

## 2020-09-23 MED ORDER — CYANOCOBALAMIN 1000 MCG/ML IJ SOLN
1000.0000 ug | Freq: Once | INTRAMUSCULAR | Status: AC
  Administered 2020-09-23: 1000 ug via INTRAMUSCULAR

## 2020-09-25 ENCOUNTER — Other Ambulatory Visit: Payer: Self-pay | Admitting: Internal Medicine

## 2020-09-26 NOTE — Progress Notes (Signed)
Subjective:  Patient ID: Lauren Mcgee, female    DOB: [birth year]  Age: 80 y.o. MRN: [id number]  CC: Follow-up (F/u on labs- Start B12 inj)   HPI Lauren Mcgee presents for a B12 deficiency-new onset  Outpatient Medications Prior to Visit  Medication Sig Dispense Refill   aspirin 81 MG chewable tablet Chew 81 mg by mouth daily.     atenolol (TENORMIN) 25 MG tablet TAKE 1 TABLET BY MOUTH EVERY DAY 90 tablet 3   cholecalciferol (VITAMIN D) 1000 UNITS tablet Take 1,000 Units by mouth daily.     DENTA 5000 PLUS 1.1 % CREA dental cream      FLUoxetine (PROZAC) 20 MG capsule Take 1 capsule (20 mg total) by mouth daily. 90 capsule 3   loperamide (IMODIUM) 2 MG capsule Take 1 capsule (2 mg total) by mouth as needed for diarrhea or loose stools. 30 capsule 0   Omega-3 Fatty Acids (FISH OIL) 1000 MG CAPS 1 po bid 100 capsule 3   omeprazole (PRILOSEC OTC) 20 MG tablet Take 20 mg by mouth daily.     SYNTHROID 75 MCG tablet TAKE 1 TABLET (75 MCG TOTAL) BY MOUTH DAILY BEFORE BREAKFAST. 30 tablet 0   No facility-administered medications prior to visit.    ROS: Review of Systems  Constitutional:  Negative for activity change, appetite change, chills, fatigue and unexpected weight change.  HENT:  Negative for congestion, mouth sores and sinus pressure.   Eyes:  Negative for visual disturbance.  Respiratory:  Negative for cough and chest tightness.   Gastrointestinal:  Negative for abdominal pain and nausea.  Genitourinary:  Negative for difficulty urinating, frequency and vaginal pain.  Musculoskeletal:  Negative for back pain and gait problem.  Skin:  Negative for pallor and rash.  Neurological:  Positive for numbness. Negative for dizziness, tremors, weakness and headaches.  Psychiatric/Behavioral:  Negative for confusion and sleep disturbance.    Objective:  BP 124/72 (BP Location: Left Arm)   Pulse 65   Temp 98.1 F (36.7 C) (Oral)   Ht 5' 5.5" (1.664 m)   Wt 157 lb 9.6 oz (71.5 kg)    SpO2 95%   BMI 25.83 kg/m   BP Readings from Last 3 Encounters:  09/23/20 124/72  09/15/20 132/80  07/23/20 118/78    Wt Readings from Last 3 Encounters:  09/23/20 157 lb 9.6 oz (71.5 kg)  09/15/20 153 lb 12.8 oz (69.8 kg)  07/23/20 157 lb 9.6 oz (71.5 kg)    Physical Exam Constitutional:      General: She is not in acute distress.    Appearance: She is well-developed.  HENT:     Head: Normocephalic.     Right Ear: External ear normal.     Left Ear: External ear normal.     Nose: Nose normal.  Eyes:     General:        Right eye: No discharge.        Left eye: No discharge.     Conjunctiva/sclera: Conjunctivae normal.     Pupils: Pupils are equal, round, and reactive to light.  Neck:     Thyroid: No thyromegaly.     Vascular: No JVD.     Trachea: No tracheal deviation.  Cardiovascular:     Rate and Rhythm: Normal rate and regular rhythm.     Heart sounds: Normal heart sounds.  Pulmonary:     Effort: No respiratory distress.     Breath sounds: No stridor. No  wheezing.  Abdominal:     General: Bowel sounds are normal. There is no distension.     Palpations: Abdomen is soft. There is no mass.     Tenderness: There is no abdominal tenderness. There is no guarding or rebound.  Musculoskeletal:        General: No tenderness.     Cervical back: Normal range of motion and neck supple. No rigidity.  Lymphadenopathy:     Cervical: No cervical adenopathy.  Skin:    Findings: No erythema or rash.  Neurological:     Cranial Nerves: No cranial nerve deficit.     Motor: No abnormal muscle tone.     Coordination: Coordination normal.     Deep Tendon Reflexes: Reflexes normal.  Psychiatric:        Behavior: Behavior normal.        Thought Content: Thought content normal.        Judgment: Judgment normal.    Lab Results  Component Value Date   WBC 6.2 03/30/2020   HGB 14.5 03/30/2020   HCT 41.8 03/30/2020   PLT 172.0 03/30/2020   GLUCOSE 101 (H) 09/15/2020   CHOL  177 03/30/2020   TRIG 199.0 (H) 03/30/2020   HDL 44.00 03/30/2020   LDLDIRECT 158.1 11/24/2008   LDLCALC 93 03/30/2020   ALT 20 09/15/2020   AST 27 09/15/2020   NA 133 (L) 09/15/2020   K 4.7 09/15/2020   CL 102 09/15/2020   CREATININE 1.00 09/15/2020   BUN 14 09/15/2020   CO2 24 09/15/2020   TSH 5.62 (H) 09/15/2020   HGBA1C 5.7 09/15/2020    CT Chest Wo Contrast  Result Date: 02/03/2020 CLINICAL DATA:  Lung nodule follow-up. EXAM: CT CHEST WITHOUT CONTRAST TECHNIQUE: Multidetector CT imaging of the chest was performed following the standard protocol without IV contrast. COMPARISON:  CT chest dated June 27, 2019. FINDINGS: Cardiovascular: No significant vascular findings. Normal heart size. No pericardial effusion. No thoracic aortic aneurysm. Coronary, aortic arch, and branch vessel atherosclerotic vascular disease. Mediastinum/Nodes: No enlarged mediastinal or axillary lymph nodes. Thyroid gland, trachea, and esophagus demonstrate no significant findings. Lungs/Pleura: Unchanged slightly irregular 7 mm nodule in the superior segment of the right lower lobe (series 3, image 76), stable since October 2020. 4 mm ground-glass nodule in the right upper lobe (series 3, image 39) is also unchanged since the prior study. No new pulmonary nodule. No focal consolidation, pleural effusion, or pneumothorax. Unchanged mild biapical pleuroparenchymal scarring. Upper Abdomen: No acute abnormality. Musculoskeletal: No acute or significant osseous findings. Unchanged small mixed sclerotic and lucent lesion in the sternal body, stable since October 2020, benign. IMPRESSION: 1. Unchanged 7 mm nodule in the superior segment of the right lower lobe. No further follow-up required. 2. Additional 4 mm ground-glass nodule in the right upper lobe is also unchanged since October 2020. No further follow-up recommended. 3. Aortic Atherosclerosis (ICD10-I70.0). Electronically Signed   By: Titus Dubin M.D.   On:  02/03/2020 16:59    Assessment & Plan:   Lanetra was seen today for follow-up.  Diagnoses and all orders for this visit:  B12 deficiency -     cyanocobalamin ((VITAMIN B-12)) injection 1,000 mcg  Other orders -     Discontinue: Cyanocobalamin (VITAMIN B-12) 1000 MCG SUBL; Place 1 tablet (1,000 mcg total) under the tongue daily. -     cyanocobalamin (,VITAMIN B-12,) 1000 MCG/ML injection; Give 1000 mcg of Vitamin B12 sq daily x 1 week, then weekly for 1 month, then  once every 2 weeks -     SYRINGE-NEEDLE, DISP, 3 ML (BD ECLIPSE SYRINGE) 25G X 1" 3 ML MISC; As directed for B12 inj     Meds ordered this encounter  Medications   DISCONTD: Cyanocobalamin (VITAMIN B-12) 1000 MCG SUBL    Sig: Place 1 tablet (1,000 mcg total) under the tongue daily.    Dispense:  100 tablet    Refill:  3   cyanocobalamin (,VITAMIN B-12,) 1000 MCG/ML injection    Sig: Give 1000 mcg of Vitamin B12 sq daily x 1 week, then weekly for 1 month, then once every 2 weeks    Dispense:  10 mL    Refill:  5   SYRINGE-NEEDLE, DISP, 3 ML (BD ECLIPSE SYRINGE) 25G X 1" 3 ML MISC    Sig: As directed for B12 inj    Dispense:  50 each    Refill:  3   cyanocobalamin ((VITAMIN B-12)) injection 1,000 mcg     Follow-up: Return in about 3 months (around 12/24/2020) for a follow-up visit.  Walker Kehr, MD

## 2020-09-26 NOTE — Assessment & Plan Note (Signed)
We discussed treatment options including sublingual or oral vitamin B12 The patient would like to start on B12 injections.  I gave her supply for her subcu injections.  Regimen of injections is included in the instructions. Will administer his subcutaneous vitamin B12 today.  Follow-up in 6- 8 weeks

## 2020-10-07 ENCOUNTER — Ambulatory Visit: Payer: Medicare Other | Admitting: Internal Medicine

## 2020-10-19 ENCOUNTER — Other Ambulatory Visit: Payer: Self-pay | Admitting: Internal Medicine

## 2020-11-16 DIAGNOSIS — R112 Nausea with vomiting, unspecified: Secondary | ICD-10-CM | POA: Diagnosis not present

## 2020-11-16 DIAGNOSIS — R6889 Other general symptoms and signs: Secondary | ICD-10-CM | POA: Diagnosis not present

## 2020-11-16 DIAGNOSIS — N390 Urinary tract infection, site not specified: Secondary | ICD-10-CM | POA: Diagnosis not present

## 2020-11-16 DIAGNOSIS — I361 Nonrheumatic tricuspid (valve) insufficiency: Secondary | ICD-10-CM | POA: Diagnosis not present

## 2020-11-16 DIAGNOSIS — Z7982 Long term (current) use of aspirin: Secondary | ICD-10-CM | POA: Diagnosis not present

## 2020-11-16 DIAGNOSIS — Z79899 Other long term (current) drug therapy: Secondary | ICD-10-CM | POA: Diagnosis not present

## 2020-11-16 DIAGNOSIS — N3000 Acute cystitis without hematuria: Secondary | ICD-10-CM | POA: Diagnosis not present

## 2020-11-16 DIAGNOSIS — R7989 Other specified abnormal findings of blood chemistry: Secondary | ICD-10-CM | POA: Diagnosis not present

## 2020-11-16 DIAGNOSIS — E785 Hyperlipidemia, unspecified: Secondary | ICD-10-CM | POA: Diagnosis not present

## 2020-11-16 DIAGNOSIS — R001 Bradycardia, unspecified: Secondary | ICD-10-CM | POA: Diagnosis not present

## 2020-11-16 DIAGNOSIS — E039 Hypothyroidism, unspecified: Secondary | ICD-10-CM | POA: Diagnosis not present

## 2020-11-16 DIAGNOSIS — I700 Atherosclerosis of aorta: Secondary | ICD-10-CM | POA: Diagnosis not present

## 2020-11-16 DIAGNOSIS — R778 Other specified abnormalities of plasma proteins: Secondary | ICD-10-CM | POA: Diagnosis not present

## 2020-11-16 DIAGNOSIS — R110 Nausea: Secondary | ICD-10-CM | POA: Diagnosis not present

## 2020-11-16 DIAGNOSIS — I10 Essential (primary) hypertension: Secondary | ICD-10-CM | POA: Diagnosis not present

## 2020-11-16 DIAGNOSIS — R1011 Right upper quadrant pain: Secondary | ICD-10-CM | POA: Diagnosis not present

## 2020-11-16 DIAGNOSIS — A419 Sepsis, unspecified organism: Secondary | ICD-10-CM | POA: Diagnosis not present

## 2020-11-16 DIAGNOSIS — F32A Depression, unspecified: Secondary | ICD-10-CM | POA: Diagnosis not present

## 2020-11-16 DIAGNOSIS — K8020 Calculus of gallbladder without cholecystitis without obstruction: Secondary | ICD-10-CM | POA: Diagnosis not present

## 2020-11-16 DIAGNOSIS — I214 Non-ST elevation (NSTEMI) myocardial infarction: Secondary | ICD-10-CM | POA: Diagnosis not present

## 2020-11-16 DIAGNOSIS — R1111 Vomiting without nausea: Secondary | ICD-10-CM | POA: Diagnosis not present

## 2020-11-16 DIAGNOSIS — R9431 Abnormal electrocardiogram [ECG] [EKG]: Secondary | ICD-10-CM | POA: Diagnosis not present

## 2020-11-16 DIAGNOSIS — I21A1 Myocardial infarction type 2: Secondary | ICD-10-CM | POA: Diagnosis not present

## 2020-11-16 DIAGNOSIS — R509 Fever, unspecified: Secondary | ICD-10-CM | POA: Diagnosis not present

## 2020-11-16 DIAGNOSIS — R300 Dysuria: Secondary | ICD-10-CM | POA: Diagnosis not present

## 2020-11-17 DIAGNOSIS — N390 Urinary tract infection, site not specified: Secondary | ICD-10-CM

## 2020-11-17 DIAGNOSIS — I214 Non-ST elevation (NSTEMI) myocardial infarction: Secondary | ICD-10-CM | POA: Diagnosis not present

## 2020-11-17 DIAGNOSIS — R7989 Other specified abnormal findings of blood chemistry: Secondary | ICD-10-CM | POA: Diagnosis not present

## 2020-11-17 DIAGNOSIS — R778 Other specified abnormalities of plasma proteins: Secondary | ICD-10-CM | POA: Diagnosis not present

## 2020-11-17 DIAGNOSIS — E039 Hypothyroidism, unspecified: Secondary | ICD-10-CM

## 2020-11-17 DIAGNOSIS — A419 Sepsis, unspecified organism: Secondary | ICD-10-CM

## 2020-11-17 DIAGNOSIS — R9431 Abnormal electrocardiogram [ECG] [EKG]: Secondary | ICD-10-CM | POA: Diagnosis not present

## 2020-11-18 DIAGNOSIS — R7989 Other specified abnormal findings of blood chemistry: Secondary | ICD-10-CM | POA: Diagnosis not present

## 2020-11-18 DIAGNOSIS — I214 Non-ST elevation (NSTEMI) myocardial infarction: Secondary | ICD-10-CM | POA: Diagnosis not present

## 2020-11-18 DIAGNOSIS — R9431 Abnormal electrocardiogram [ECG] [EKG]: Secondary | ICD-10-CM | POA: Diagnosis not present

## 2020-11-18 DIAGNOSIS — R778 Other specified abnormalities of plasma proteins: Secondary | ICD-10-CM | POA: Diagnosis not present

## 2020-12-14 ENCOUNTER — Encounter: Payer: Self-pay | Admitting: Internal Medicine

## 2020-12-14 ENCOUNTER — Ambulatory Visit (INDEPENDENT_AMBULATORY_CARE_PROVIDER_SITE_OTHER): Payer: Medicare Other | Admitting: Internal Medicine

## 2020-12-14 ENCOUNTER — Other Ambulatory Visit: Payer: Self-pay

## 2020-12-14 VITALS — BP 146/78 | HR 70 | Temp 98.0°F | Ht 65.5 in | Wt 152.6 lb

## 2020-12-14 DIAGNOSIS — E538 Deficiency of other specified B group vitamins: Secondary | ICD-10-CM | POA: Diagnosis not present

## 2020-12-14 DIAGNOSIS — Z0000 Encounter for general adult medical examination without abnormal findings: Secondary | ICD-10-CM

## 2020-12-14 DIAGNOSIS — Z23 Encounter for immunization: Secondary | ICD-10-CM

## 2020-12-14 DIAGNOSIS — E785 Hyperlipidemia, unspecified: Secondary | ICD-10-CM | POA: Diagnosis not present

## 2020-12-14 DIAGNOSIS — I2583 Coronary atherosclerosis due to lipid rich plaque: Secondary | ICD-10-CM | POA: Diagnosis not present

## 2020-12-14 DIAGNOSIS — G720 Drug-induced myopathy: Secondary | ICD-10-CM

## 2020-12-14 DIAGNOSIS — T466X5A Adverse effect of antihyperlipidemic and antiarteriosclerotic drugs, initial encounter: Secondary | ICD-10-CM

## 2020-12-14 DIAGNOSIS — N3000 Acute cystitis without hematuria: Secondary | ICD-10-CM

## 2020-12-14 DIAGNOSIS — N390 Urinary tract infection, site not specified: Secondary | ICD-10-CM | POA: Insufficient documentation

## 2020-12-14 DIAGNOSIS — I2510 Atherosclerotic heart disease of native coronary artery without angina pectoris: Secondary | ICD-10-CM | POA: Diagnosis not present

## 2020-12-14 MED ORDER — CYANOCOBALAMIN 1000 MCG/ML IJ SOLN: INTRAMUSCULAR | 5 refills | Status: DC

## 2020-12-14 NOTE — Assessment & Plan Note (Signed)
10/22 Recurrent UTIs x 3 per year. Pt had a UTI in Sept 2022, took Cipro - had n/v and went to [facility] - d/c'd on Augmentin. Dr Helane Rima gave the pt Macrobid to take QHS.

## 2020-12-14 NOTE — Progress Notes (Signed)
Subjective:  Patient ID: Lauren Mcgee, female    DOB: 03/13/1940  Age: 80 y.o. MRN: [id number]  CC: Follow-up (3 MONTH F/U- Flu shot)   HPI Lauren Mcgee presents for B12 def, UTIs x 3 per year. Pt had a UTI in Sept, took Cipro - had n/v and went to [facility] - d/c'd on Augmentin. Dr Helane Rima gave the pt Macrobid to take QHS. C/o CAD  Outpatient Medications Prior to Visit  Medication Sig Dispense Refill   aspirin 81 MG chewable tablet Chew 81 mg by mouth daily.     atenolol (TENORMIN) 25 MG tablet TAKE 1 TABLET BY MOUTH EVERY DAY 90 tablet 3   cholecalciferol (VITAMIN D) 1000 UNITS tablet Take 1,000 Units by mouth daily.     DENTA 5000 PLUS 1.1 % CREA dental cream      FLUoxetine (PROZAC) 20 MG capsule Take 1 capsule (20 mg total) by mouth daily. 90 capsule 3   nitrofurantoin, macrocrystal-monohydrate, (MACROBID) 100 MG capsule Take 100 mg by mouth at bedtime. Take 1 by mouth at bedtime     Omega-3 Fatty Acids (FISH OIL) 1000 MG CAPS 1 po bid 100 capsule 3   omeprazole (PRILOSEC OTC) 20 MG tablet Take 20 mg by mouth daily.     SYNTHROID 75 MCG tablet TAKE 1 TABLET BY MOUTH DAILY BEFORE BREAKFAST. 60 tablet 0   SYRINGE-NEEDLE, DISP, 3 ML (BD ECLIPSE SYRINGE) 25G X 1" 3 ML MISC As directed for B12 inj 50 each 3   Turmeric (QC TUMERIC COMPLEX PO) Take 475 mg by mouth daily.     cyanocobalamin (,VITAMIN B-12,) 1000 MCG/ML injection Give 1000 mcg of Vitamin B12 sq daily x 1 week, then weekly for 1 month, then once every 2 weeks 10 mL 5   loperamide (IMODIUM) 2 MG capsule Take 1 capsule (2 mg total) by mouth as needed for diarrhea or loose stools. (Patient not taking: Reported on 12/14/2020) 30 capsule 0   No facility-administered medications prior to visit.    ROS: Review of Systems  Constitutional:  Negative for activity change, appetite change, chills, fatigue and unexpected weight change.  HENT:  Negative for congestion, mouth sores and sinus pressure.   Eyes:  Negative for  visual disturbance.  Respiratory:  Negative for cough and chest tightness.   Gastrointestinal:  Negative for abdominal pain and nausea.  Genitourinary:  Positive for frequency and urgency. Negative for difficulty urinating and vaginal pain.  Musculoskeletal:  Negative for back pain and gait problem.  Skin:  Negative for pallor and rash.  Neurological:  Negative for dizziness, tremors, weakness, numbness and headaches.  Psychiatric/Behavioral:  Negative for confusion and sleep disturbance.    Objective:  BP (!) 146/78 (BP Location: Left Arm)   Pulse 70   Temp 98 F (36.7 C) (Oral)   Ht 5' 5.5" (1.664 m)   Wt 152 lb 9.6 oz (69.2 kg)   SpO2 97%   BMI 25.01 kg/m   BP Readings from Last 3 Encounters:  12/14/20 (!) 146/78  09/23/20 124/72  09/15/20 132/80    Wt Readings from Last 3 Encounters:  12/14/20 152 lb 9.6 oz (69.2 kg)  09/23/20 157 lb 9.6 oz (71.5 kg)  09/15/20 153 lb 12.8 oz (69.8 kg)    Physical Exam Constitutional:      General: She is not in acute distress.    Appearance: She is well-developed.  HENT:     Head: Normocephalic.     Right Ear: External  ear normal.     Left Ear: External ear normal.     Nose: Nose normal.  Eyes:     General:        Right eye: No discharge.        Left eye: No discharge.     Conjunctiva/sclera: Conjunctivae normal.     Pupils: Pupils are equal, round, and reactive to light.  Neck:     Thyroid: No thyromegaly.     Vascular: No JVD.     Trachea: No tracheal deviation.  Cardiovascular:     Rate and Rhythm: Normal rate and regular rhythm.     Heart sounds: Normal heart sounds.  Pulmonary:     Effort: No respiratory distress.     Breath sounds: No stridor. No wheezing.  Abdominal:     General: Bowel sounds are normal. There is no distension.     Palpations: Abdomen is soft. There is no mass.     Tenderness: There is no abdominal tenderness. There is no guarding or rebound.  Musculoskeletal:        General: Tenderness  present.     Cervical back: Normal range of motion and neck supple. No rigidity.  Lymphadenopathy:     Cervical: No cervical adenopathy.  Skin:    Findings: No erythema or rash.  Neurological:     Cranial Nerves: No cranial nerve deficit.     Motor: No abnormal muscle tone.     Coordination: Coordination normal.     Deep Tendon Reflexes: Reflexes normal.  Psychiatric:        Behavior: Behavior normal.        Thought Content: Thought content normal.        Judgment: Judgment normal.    Lab Results  Component Value Date   WBC 6.2 03/30/2020   HGB 14.5 03/30/2020   HCT 41.8 03/30/2020   PLT 172.0 03/30/2020   GLUCOSE 101 (H) 09/15/2020   CHOL 177 03/30/2020   TRIG 199.0 (H) 03/30/2020   HDL 44.00 03/30/2020   LDLDIRECT 158.1 11/24/2008   LDLCALC 93 03/30/2020   ALT 20 09/15/2020   AST 27 09/15/2020   NA 133 (L) 09/15/2020   K 4.7 09/15/2020   CL 102 09/15/2020   CREATININE 1.00 09/15/2020   BUN 14 09/15/2020   CO2 24 09/15/2020   TSH 5.62 (H) 09/15/2020   HGBA1C 5.7 09/15/2020    CT Chest Wo Contrast  Result Date: 02/03/2020 CLINICAL DATA:  Lung nodule follow-up. EXAM: CT CHEST WITHOUT CONTRAST TECHNIQUE: Multidetector CT imaging of the chest was performed following the standard protocol without IV contrast. COMPARISON:  CT chest dated June 27, 2019. FINDINGS: Cardiovascular: No significant vascular findings. Normal heart size. No pericardial effusion. No thoracic aortic aneurysm. Coronary, aortic arch, and branch vessel atherosclerotic vascular disease. Mediastinum/Nodes: No enlarged mediastinal or axillary lymph nodes. Thyroid gland, trachea, and esophagus demonstrate no significant findings. Lungs/Pleura: Unchanged slightly irregular 7 mm nodule in the superior segment of the right lower lobe (series 3, image 76), stable since October 2020. 4 mm ground-glass nodule in the right upper lobe (series 3, image 39) is also unchanged since the prior study. No new pulmonary  nodule. No focal consolidation, pleural effusion, or pneumothorax. Unchanged mild biapical pleuroparenchymal scarring. Upper Abdomen: No acute abnormality. Musculoskeletal: No acute or significant osseous findings. Unchanged small mixed sclerotic and lucent lesion in the sternal body, stable since October 2020, benign. IMPRESSION: 1. Unchanged 7 mm nodule in the superior segment of the right lower  lobe. No further follow-up required. 2. Additional 4 mm ground-glass nodule in the right upper lobe is also unchanged since October 2020. No further follow-up recommended. 3. Aortic Atherosclerosis (ICD10-I70.0). Electronically Signed   By: Titus Dubin M.D.   On: 02/03/2020 16:59    Assessment & Plan:   Problem List Items Addressed This Visit     B12 deficiency    Change B12 to q 2 wks      Coronary atherosclerosis    Off statin Cardiol ref - Dr Harrington Challenger      Relevant Orders   Ambulatory referral to Cardiology   Statin myopathy    Off Pravachol      UTI (urinary tract infection) - Primary    10/22 Recurrent UTIs x 3 per year. Pt had a UTI in Sept 2022, took Cipro - had n/v and went to [facility] - d/c'd on Augmentin. Dr Helane Rima gave the pt Macrobid to take QHS.      Relevant Medications   nitrofurantoin, macrocrystal-monohydrate, (MACROBID) 100 MG capsule   Other Visit Diagnoses     Needs flu shot       Relevant Orders   Flu Vaccine QUAD High Dose(Fluad) (Completed)         Follow-up: No follow-ups on file.  Walker Kehr, MD

## 2020-12-14 NOTE — Assessment & Plan Note (Signed)
Off Pravachol

## 2020-12-14 NOTE — Assessment & Plan Note (Addendum)
Off statin Cardiol ref - Dr Harrington Challenger

## 2020-12-14 NOTE — Assessment & Plan Note (Signed)
Change B12 to q 2 wks

## 2020-12-16 ENCOUNTER — Other Ambulatory Visit: Payer: Self-pay | Admitting: Internal Medicine

## 2020-12-17 DIAGNOSIS — I10 Essential (primary) hypertension: Secondary | ICD-10-CM | POA: Diagnosis not present

## 2020-12-19 DIAGNOSIS — N390 Urinary tract infection, site not specified: Secondary | ICD-10-CM | POA: Diagnosis not present

## 2021-01-10 ENCOUNTER — Encounter: Payer: Self-pay | Admitting: Internal Medicine

## 2021-01-11 ENCOUNTER — Encounter: Payer: Self-pay | Admitting: Internal Medicine

## 2021-01-11 ENCOUNTER — Other Ambulatory Visit: Payer: Self-pay

## 2021-01-11 ENCOUNTER — Ambulatory Visit: Payer: Medicare Other | Admitting: Internal Medicine

## 2021-01-11 VITALS — BP 122/62 | HR 65 | Ht 65.5 in | Wt 156.0 lb

## 2021-01-11 DIAGNOSIS — R0600 Dyspnea, unspecified: Secondary | ICD-10-CM | POA: Diagnosis not present

## 2021-01-11 NOTE — Patient Instructions (Signed)
Medication Instructions:  Continue same medications *If you need a refill on your cardiac medications before your next appointment, please call your pharmacy*   Lab Work: None ordered   Testing/Procedures: None ordered   Follow-Up: At [facility], you and your health needs are our priority.  As part of our continuing mission to provide you with exceptional heart care, we have created designated Provider Care Teams.  These Care Teams include your primary Cardiologist (physician) and Advanced Practice Providers (APPs -  Physician Assistants and Nurse Practitioners) who all work together to provide you with the care you need, when you need it.  We recommend signing up for the patient portal called "MyChart".  Sign up information is provided on this After Visit Summary.  MyChart is used to connect with patients for Virtual Visits (Telemedicine).  Patients are able to view lab/test results, encounter notes, upcoming appointments, etc.  Non-urgent messages can be sent to your provider as well.   To learn more about what you can do with MyChart, go to [email or web address].      Your next appointment:  3 months    The format for your next appointment: Office   Provider:  Dr.Branch

## 2021-01-11 NOTE — Progress Notes (Signed)
Cardiology Office Note:    Date:  01/11/2021   ID:  [name], DOB Jan 22, 1941, MRN [id number]  PCP:  Cassandria Anger, MD   [facility] Providers Cardiologist:  None     Referring MD: Dian Queen, MD   No chief complaint on file. Fatigue  History of Present Illness:    Lauren Mcgee is a 80 y.o. female with a hx of GERD, HTN, HLD,   She was in the hospital for a UTI. She saw her gynecologist who said her breath was heavy.  During the day she does housework. After vacuuming a room or so for 15 minutes then she will have to sit down. This has not progressed. No chest pain or pressure. No orthopnea or PND. She had a stress test 10-15 years ago and it was normal. It was a treadmill, SPECT. No LHC. No cardiologist.  She smoked when she was 77 and stopped after pregnancy. Mother lived to be 87 and father 72. Mother had heart disease later in life and father with stroke. Two sons are healthy. She has brother and he is healthy. No stroke. No diabetes. She does not take the pravastatin and she was advised to stick with her fish oil. She kept loosing her balance with the statin medication. She has low B12, gets a shot every other week. Her symptoms are not progressing, this has been for about a year.  Her blood pressure is well controlled.   Cardiology Studies  TTE 12/13/2020 Normal LV/RV function No valve dx PA pressure 38 mmHg No pericardial effusion  Carotid duplex BL - normal  Past Medical History:  Diagnosis Date   Diverticulosis of colon (without mention of hemorrhage)    GERD (gastroesophageal reflux disease)    Hx of colonic polyp 2001   TUBULAR ADENOMA   Hyperlipidemia    Hypertension    Hypothyroidism    IBS (irritable bowel syndrome)    PVD (peripheral vascular disease) ([place])    Thyroid cancer ([place]) 2011    Past Surgical History:  Procedure Laterality Date   ABDOMINAL HYSTERECTOMY  1991   COLONOSCOPY  2010   562.10   ESOPHAGOGASTRODUODENOSCOPY      multiple   THYROIDECTOMY  2010   and 131I treatment at [facility]- Dr. Celine Ahr    Current Medications: Current Meds  Medication Sig   aspirin 81 MG chewable tablet Chew 81 mg by mouth daily.   atenolol (TENORMIN) 25 MG tablet TAKE 1 TABLET BY MOUTH EVERY DAY   atorvastatin (LIPITOR) 10 MG tablet Take 10 mg by mouth daily.   carvedilol (COREG) 6.25 MG tablet Take 6.25 mg by mouth 2 (two) times daily with a meal.   cefUROXime (CEFTIN) 250 MG tablet Take 250 mg by mouth 2 (two) times daily with a meal.   cholecalciferol (VITAMIN D) 1000 UNITS tablet Take 1,000 Units by mouth daily.   ciprofloxacin (CIPRO) 500 MG tablet Take 500 mg by mouth 2 (two) times daily.   cyanocobalamin (,VITAMIN B-12,) 1000 MCG/ML injection Give 1000 mcg of Vitamin B12 sq once every 2 weeks   DENTA 5000 PLUS 1.1 % CREA dental cream    escitalopram (LEXAPRO) 20 MG tablet Take 20 mg by mouth daily.   estradiol (ESTRACE) 0.1 MG/GM vaginal cream Place 1 Applicatorful vaginally at bedtime.   FLUoxetine (PROZAC) 20 MG capsule Take 1 capsule (20 mg total) by mouth daily.   fluticasone (FLONASE) 50 MCG/ACT nasal spray Place into both nostrils daily.   levothyroxine (SYNTHROID)  88 MCG tablet Take 88 mcg by mouth daily before breakfast.   nitrofurantoin, macrocrystal-monohydrate, (MACROBID) 100 MG capsule Take 100 mg by mouth at bedtime. Take 1 by mouth at bedtime   Omega-3 Fatty Acids (FISH OIL) 1000 MG CAPS 1 po bid   omeprazole (PRILOSEC OTC) 20 MG tablet Take 20 mg by mouth daily.   phenazopyridine (PYRIDIUM) 100 MG tablet Take 100 mg by mouth 3 (three) times daily as needed for pain.   pravastatin (PRAVACHOL) 40 MG tablet Take 40 mg by mouth daily.   sulfamethoxazole-trimethoprim (BACTRIM DS) 800-160 MG tablet SMARTSIG:1 Tablet(s) By Mouth Every 12 Hours   SYRINGE-NEEDLE, DISP, 3 ML (BD ECLIPSE SYRINGE) 25G X 1" 3 ML MISC As directed for B12 inj   Turmeric (QC TUMERIC COMPLEX PO) Take 475 mg by mouth daily.     Allergies:    Influenza vaccines, Lexapro [escitalopram], and Pravastatin   Social History   Socioeconomic History   Marital status: Married    Spouse name: Not on file   Number of children: 2   Years of education: Not on file   Highest education level: Not on file  Occupational History   Occupation: retired  Tobacco Use   Smoking status: Former    Packs/day: 0.50    Types: Cigarettes    Quit date: 03/06/1958    Years since quitting: 62.8   Smokeless tobacco: Never  Vaping Use   Vaping Use: Never used  Substance and Sexual Activity   Alcohol use: No   Drug use: No   Sexual activity: Yes  Other Topics Concern   Not on file  Social History Narrative   Retired   Married 2 kids   Regular exercise: not outside the home    Caffeine use: coffee daily   Former smoker, no alcohol, no drugs   Social Determinants of Radio broadcast assistant Strain: Low Risk    Difficulty of Paying Living Expenses: Not hard at all  Food Insecurity: No Food Insecurity   Worried About Charity fundraiser in the Last Year: Never true   Arboriculturist in the Last Year: Never true  Transportation Needs: No Transportation Needs   Lack of Transportation (Medical): No   Lack of Transportation (Non-Medical): No  Physical Activity: Sufficiently Active   Days of Exercise per Week: 5 days   Minutes of Exercise per Session: 30 min  Stress: No Stress Concern Present   Feeling of Stress : Not at all  Social Connections: Socially Integrated   Frequency of Communication with Friends and Family: More than three times a week   Frequency of Social Gatherings with Friends and Family: Twice a week   Attends Religious Services: More than 4 times per year   Active Member of [employer] or Organizations: Yes   Attends Music therapist: More than 4 times per year   Marital Status: Married     Family History: The patient's family history includes Colon cancer (age of onset: 60) in her son; Colon cancer (age of  onset: 20) in her paternal aunt; Heart disease in her mother; Hyperlipidemia in her mother; Hypertension in an other family member. There is no history of Colon polyps, Liver cancer, Pancreatic cancer, Rectal cancer, or Stomach cancer.  ROS:   Please see the history of present illness.     All other systems reviewed and are negative.  EKGs/Labs/Other Studies Reviewed:    The following studies were reviewed today:   EKG:  EKG is  ordered today.  The ekg ordered today demonstrates  NSR,no scar, no acute ischemic changes   Recent Labs: 03/30/2020: Hemoglobin 14.5; Platelets 172.0 09/15/2020: ALT 20; BUN 14; Creatinine, Ser 1.00; Potassium 4.7; Sodium 133; TSH 5.62  Recent Lipid Panel    Component Value Date/Time   CHOL 177 03/30/2020 1054   TRIG 199.0 (H) 03/30/2020 1054   HDL 44.00 03/30/2020 1054   CHOLHDL 4 03/30/2020 1054   VLDL 39.8 03/30/2020 1054   LDLCALC 93 03/30/2020 1054   LDLDIRECT 158.1 11/24/2008 0933     Risk Assessment/Calculations:           Physical Exam:    VS:  BP 122/62   Pulse 65   Ht 5' 5.5" (1.664 m)   Wt 156 lb (70.8 kg)   SpO2 96%   BMI 25.56 kg/m     Wt Readings from Last 3 Encounters:  01/11/21 156 lb (70.8 kg)  12/14/20 152 lb 9.6 oz (69.2 kg)  09/23/20 157 lb 9.6 oz (71.5 kg)     GEN:  Well nourished, well developed in no acute distress HEENT: Normal NECK: No JVD; No carotid bruits LYMPHATICS: No lymphadenopathy CARDIAC: RRR, no murmurs, rubs, gallops RESPIRATORY:  Clear to auscultation without rales, wheezing or rhonchi  ABDOMEN: Soft, non-tender, non-distended MUSCULOSKELETAL:  No edema; No deformity  SKIN: Warm and dry NEUROLOGIC:  Alert and oriented x 3 PSYCHIATRIC:  Normal affect   ASSESSMENT:    1. Dyspnea, unspecified type   Her symptoms are subacute to chronic and c/w deconditioning. She has no chest pressure or ischemic changes on her EKG. Her echocardiogram was normal in October. She has no signs of CHF. I advised  that if her symptoms progress to call us and if concerned call EMS. Her and her husband were in agreement.   PLAN:    In order of problems listed above:  Follow up in 3 months        Medication Adjustments/Labs and Tests Ordered: Current medicines are reviewed at length with the patient today.  Concerns regarding medicines are outlined above.   Signed, Janina Mayo, MD  01/11/2021 1:44 PM    [facility]

## 2021-01-21 DIAGNOSIS — Z1231 Encounter for screening mammogram for malignant neoplasm of breast: Secondary | ICD-10-CM | POA: Diagnosis not present

## 2021-01-21 LAB — HM MAMMOGRAPHY

## 2021-01-26 ENCOUNTER — Encounter: Payer: Self-pay | Admitting: Internal Medicine

## 2021-02-03 DIAGNOSIS — U071 COVID-19: Secondary | ICD-10-CM | POA: Diagnosis not present

## 2021-02-09 LAB — HM MAMMOGRAPHY

## 2021-02-11 ENCOUNTER — Encounter: Payer: Self-pay | Admitting: Internal Medicine

## 2021-02-11 ENCOUNTER — Other Ambulatory Visit: Payer: Self-pay | Admitting: Internal Medicine

## 2021-02-28 ENCOUNTER — Other Ambulatory Visit: Payer: Self-pay | Admitting: Internal Medicine

## 2021-03-01 ENCOUNTER — Other Ambulatory Visit: Payer: Self-pay | Admitting: Internal Medicine

## 2021-03-03 ENCOUNTER — Other Ambulatory Visit: Payer: Self-pay

## 2021-03-03 ENCOUNTER — Ambulatory Visit: Payer: Medicare Other | Admitting: Internal Medicine

## 2021-03-03 ENCOUNTER — Other Ambulatory Visit: Payer: Self-pay | Admitting: Internal Medicine

## 2021-03-03 ENCOUNTER — Encounter: Payer: Self-pay | Admitting: Internal Medicine

## 2021-03-03 VITALS — BP 128/80 | HR 62 | Ht 65.5 in | Wt 153.8 lb

## 2021-03-03 DIAGNOSIS — Z85850 Personal history of malignant neoplasm of thyroid: Secondary | ICD-10-CM | POA: Diagnosis not present

## 2021-03-03 DIAGNOSIS — E890 Postprocedural hypothyroidism: Secondary | ICD-10-CM | POA: Diagnosis not present

## 2021-03-03 DIAGNOSIS — H903 Sensorineural hearing loss, bilateral: Secondary | ICD-10-CM | POA: Diagnosis not present

## 2021-03-03 LAB — T4, FREE: Free T4: 1.01 ng/dL (ref 0.60–1.60)

## 2021-03-03 LAB — TSH: TSH: 7.97 u[IU]/mL — ABNORMAL HIGH (ref 0.35–5.50)

## 2021-03-03 NOTE — Patient Instructions (Addendum)
Please continue Synthroid 88 mcg daily.  Take the thyroid hormone every day, with water, at least 30 minutes before breakfast, separated by at least 4 hours from: - acid reflux medications - calcium - iron - multivitamins  Please stop at the lab.  Please come back for a follow-up appointment in 6 months.

## 2021-03-03 NOTE — Progress Notes (Signed)
Patient ID: Lauren Mcgee, female   DDOB: 09-16-1940, 80 y.o.   MRN: [id number]  This visit occurred during the SARS-CoV-2 public health emergency.  Safety protocols were in place, including screening questions prior to the visit, additional usage of staff PPE, and extensive cleaning of exam room while observing appropriate contact time as indicated for disinfecting solutions.   HPI  Lauren Mcgee is a 80 y.o.-year-old female, returning for f/u for follicular and papillary thyroid cancer, in remission, and postsurgical hypothyroidism. Last visit 1 year and 2 months ago.  Interim history: She was on Amoxicillin for a UTI in 11/2020. She feels well, w/o complaints today.  Reviewed and addended history: Thyroid cancer  - dx'ed in 2010.  Thyroidectomy  on 12/07/2009 (Dr Fredirick Maudlin) for a thyroid tumor >> ThyCA: Minimally invasive follicular carcinoma of the right thyroid lobe, measuring 2.7 cm, and multifocal papillary microcarcinoma bilaterally. No nodes sampled. One of the papillary tumors was approaching and may have involved the surgical resection margin. There was minimal extra thyroid though invasion of one of the papillary microcarcinoma's, but no lymphovascular invasion.  RAI tx with 154 mCi I-131 on 03/16/2010. At that time TSH was >100, thyroglobulin was 0.7, ATA<20  Post treatment WBS did not show metastatic disease. There were foci of activity which were previously seen on the pretherapy scan. No new lesions identified.  Neck U/S 12/09/2012: Post thyroidectomy. No evidence of residual thyroid tissue or mass/nodule at the thyroid bed.  CT chest 06/27/2019: Lungs/Pleura: Again noted is a slightly irregular nodule in the superior segment of the right lower lobe with irregular margins. It appears essentially unchanged in size measuring approximately 6.7 mm maximum diameter. There is new posterior pleural thickening in the right hemithorax best seen on image 59 of series 6. There is a  new faint ground-glass 4 mm nodule in the right upper lobe seen on image 58 of series 6 and image 33 of series 3. The lungs are otherwise clear.  CT chest 02/03/2020:  1. Unchanged 7 mm nodule in the superior segment of the right lower lobe. No further follow-up required. 2. Additional 4 mm ground-glass nodule in the right upper lobe is also unchanged since October 2020. No further follow-up recommended.  Reviewed her thyroglobulin and ATA levels: Lab Results  Component Value Date   THYROGLB 0.1 (L) 12/16/2019   THYROGLB <0.1 (L) 01/23/2019   THYROGLB 0.1 (L) 01/11/2018   THYROGLB 0.1 (L) 12/06/2016   THYROGLB <0.1 (L) 10/12/2015   THYROGLB <0.1 (L) 10/12/2014   THYROGLB <0.2 06/24/2013   THYROGLB <0.2 12/02/2012   THGAB <1 12/16/2019   THGAB <1 01/23/2019   THGAB <1 01/11/2018   THGAB <1 12/06/2016   THGAB <1 10/12/2015   THGAB <1 10/12/2014   THGAB <20.0 06/24/2013   THGAB <20.0 12/02/2012   Postsurgical hypothyroidism:  She is on Synthroid 88 mcg daily: - in am - fasting - 1h from b'fast - no Ca, Fe, MVI - off PPIs - Prev. later in the day, occasionally - not on Biotin  Reviewed her TFTs: Lab Results  Component Value Date   TSH 5.62 (H) 09/15/2020   TSH 11.76 (H) 03/30/2020   TSH 5.42 (H) 12/16/2019   TSH 1.83 06/12/2019   TSH 0.57 04/15/2019   TSH 0.30 (L) 03/12/2019   TSH 0.18 (L) 01/23/2019   TSH 0.09 (L) 11/22/2018   TSH 0.88 04/03/2018   TSH 9.04 (H) 02/18/2018   FREET4 0.91 04/01/2020   FREET4 0.70 12/16/2019  FREET4 0.75 06/12/2019   FREET4 0.94 04/15/2019   FREET4 1.08 03/12/2019   FREET4 1.09 01/23/2019   FREET4 1.05 12/06/2018   FREET4 1.14 04/03/2018   FREET4 0.69 02/18/2018   FREET4 0.67 01/11/2018   Pt denies: - feeling nodules in neck - hoarseness - choking - SOB with lying down But does have chronic dysphagia, which started before her surgery.  She continues atenolol per cardiology.  She also has a history of HTN, HL (Crestor  >> fatigue), PVD, GERD.   ROS: + subjective hyperthermia + see HPI  I reviewed pt's medications, allergies, PMH, social hx, family hx, and changes were documented in the history of present illness. Otherwise, unchanged from my initial visit note.  Past Medical History:  Diagnosis Date   Diverticulosis of colon (without mention of hemorrhage)    GERD (gastroesophageal reflux disease)    Hx of colonic polyp 2001   TUBULAR ADENOMA   Hyperlipidemia    Hypertension    Hypothyroidism    IBS (irritable bowel syndrome)    PVD (peripheral vascular disease) ([place])    Thyroid cancer ([place]) 2011   Past Surgical History:  Procedure Laterality Date   ABDOMINAL HYSTERECTOMY  1991   COLONOSCOPY  2010   562.10   ESOPHAGOGASTRODUODENOSCOPY     multiple   THYROIDECTOMY  2010   and 131I treatment at [facility]- Dr. Celine Ahr   Social History   Socioeconomic History   Marital status: Married    Spouse name: Not on file   Number of children: 2   Years of education: Not on file   Highest education level: Not on file  Occupational History   Occupation: retired  Tobacco Use   Smoking status: Former    Packs/day: 0.50    Types: Cigarettes    Quit date: 03/06/1958    Years since quitting: 63.0   Smokeless tobacco: Never  Vaping Use   Vaping Use: Never used  Substance and Sexual Activity   Alcohol use: No   Drug use: No   Sexual activity: Yes  Other Topics Concern   Not on file  Social History Narrative   Retired   Married 2 kids   Regular exercise: not outside the home    Caffeine use: coffee daily   Former smoker, no alcohol, no drugs   Social Determinants of Radio broadcast assistant Strain: Low Risk    Difficulty of Paying Living Expenses: Not hard at all  Food Insecurity: No Food Insecurity   Worried About Charity fundraiser in the Last Year: Never true   Arboriculturist in the Last Year: Never true  Transportation Needs: No Transportation Needs   Lack of Transportation  (Medical): No   Lack of Transportation (Non-Medical): No  Physical Activity: Sufficiently Active   Days of Exercise per Week: 5 days   Minutes of Exercise per Session: 30 min  Stress: No Stress Concern Present   Feeling of Stress : Not at all  Social Connections: Socially Integrated   Frequency of Communication with Friends and Family: More than three times a week   Frequency of Social Gatherings with Friends and Family: Twice a week   Attends Religious Services: More than 4 times per year   Active Member of [employer] or Organizations: Yes   Attends Music therapist: More than 4 times per year   Marital Status: Married  Human resources officer Violence: Not on file   Current Outpatient Medications on File Prior to Visit  Medication Sig Dispense Refill   aspirin 81 MG chewable tablet Chew 81 mg by mouth daily.     atenolol (TENORMIN) 25 MG tablet TAKE 1 TABLET BY MOUTH EVERY DAY 90 tablet 3   atorvastatin (LIPITOR) 10 MG tablet Take 10 mg by mouth daily.     carvedilol (COREG) 6.25 MG tablet Take 6.25 mg by mouth 2 (two) times daily with a meal.     cefUROXime (CEFTIN) 250 MG tablet Take 250 mg by mouth 2 (two) times daily with a meal.     cholecalciferol (VITAMIN D) 1000 UNITS tablet Take 1,000 Units by mouth daily.     ciprofloxacin (CIPRO) 500 MG tablet Take 500 mg by mouth 2 (two) times daily.     cyanocobalamin (,VITAMIN B-12,) 1000 MCG/ML injection Give 1000 mcg of Vitamin B12 sq once every 2 weeks 10 mL 5   DENTA 5000 PLUS 1.1 % CREA dental cream      escitalopram (LEXAPRO) 20 MG tablet Take 20 mg by mouth daily.     estradiol (ESTRACE) 0.1 MG/GM vaginal cream Place 1 Applicatorful vaginally at bedtime.     FLUoxetine (PROZAC) 20 MG capsule Take 1 capsule (20 mg total) by mouth daily. 90 capsule 3   fluticasone (FLONASE) 50 MCG/ACT nasal spray Place into both nostrils daily.     levothyroxine (SYNTHROID) 88 MCG tablet Take 88 mcg by mouth daily before breakfast.      nitrofurantoin, macrocrystal-monohydrate, (MACROBID) 100 MG capsule Take 100 mg by mouth at bedtime. Take 1 by mouth at bedtime     Omega-3 Fatty Acids (FISH OIL) 1000 MG CAPS 1 po bid 100 capsule 3   omeprazole (PRILOSEC OTC) 20 MG tablet Take 20 mg by mouth daily.     phenazopyridine (PYRIDIUM) 100 MG tablet Take 100 mg by mouth 3 (three) times daily as needed for pain.     sulfamethoxazole-trimethoprim (BACTRIM DS) 800-160 MG tablet SMARTSIG:1 Tablet(s) By Mouth Every 12 Hours     SYRINGE-NEEDLE, DISP, 3 ML (BD ECLIPSE SYRINGE) 25G X 1" 3 ML MISC As directed for B12 inj 50 each 3   Turmeric (QC TUMERIC COMPLEX PO) Take 475 mg by mouth daily.     No current facility-administered medications on file prior to visit.   Allergies  Allergen Reactions   Influenza Vaccines     Local reaction   Lexapro [Escitalopram]     Withdrawal sx's   Pravastatin     Cramps, dizziness   Family History  Problem Relation Age of Onset   Hyperlipidemia Mother    Heart disease Mother    Hypertension Other    Colon cancer Paternal Aunt 47   Colon cancer Son 29   Colon polyps Neg Hx    Liver cancer Neg Hx    Pancreatic cancer Neg Hx    Rectal cancer Neg Hx    Stomach cancer Neg Hx     PE: BP 128/80 (BP Location: Right Arm, Patient Position: Sitting, Cuff Size: Normal)    Pulse 62    Ht 5' 5.5" (1.664 m)    Wt 153 lb 12.8 oz (69.8 kg)    SpO2 96%    BMI 25.20 kg/m  Body mass index is 25.2 kg/m. Wt Readings from Last 3 Encounters:  03/03/21 153 lb 12.8 oz (69.8 kg)  01/11/21 156 lb (70.8 kg)  12/14/20 152 lb 9.6 oz (69.2 kg)   Constitutional: overweight, in NAD Eyes: PERRLA, EOMI, no exophthalmos ENT: moist mucous membranes, no neck masses,  no cervical lymphadenopathy Cardiovascular: RRR, No MRG Respiratory: CTA B Musculoskeletal: no deformities, strength intact in all 4 Skin: moist, warm, no rashes Neurological: no tremor with outstretched hands, DTR normal in all 4  ASSESSMENT: 1.  Hypothyroidism  2. History of thyroid cancer  PLAN:  1. Patient with postsurgical hypothyroidism, on Synthroid d.a.w. - in the past, she had increased perspiration and heat intolerance.  TSH was suppressed so we decreased her Synthroid dose. -However, afterwards, her thyroid hormone doses were adjusted by PCP - latest thyroid labs reviewed with pt. >> TSH was elevated: Lab Results  Component Value Date   TSH 5.62 (H) 09/15/2020  - she is on Synthroid 88 mcg daily - pt feels good on this dose.  She lost 5 pounds since last visit. - we discussed about taking the thyroid hormone every day, with water, >30 minutes before breakfast, separated by >4 hours from acid reflux medications, calcium, iron, multivitamins. Pt. is taking it correctly. - will check thyroid tests today: TSH and fT4 - If labs are abnormal, she will need to return for repeat TFTs in 1.5 months  2. PTC -In remission -Reviewed records available from Dr. Celine Ahr -Of note, she had small lung nodules on chest CT.  A repeat CT was done on 02/03/2020.  The Nodules appeared to be stable and no further follow-up was recommended.  These probably bear no relationship with her thyroid cancer. -Previously, antithyroglobulin antibodies are undetectable while thyroglobulin  levels were varying between undetectable and barely detectable at 0.1.  Latest thyroglobulin was 0.1.  We discussed that this may have been due to assay variability. -No neck compression symptoms and no masses felt on palpation of her neck today -We discussed about obtaining another neck ultrasound if thyroglobulin is increasing -At today's visit we will repeat her thyroglobulin and ATA antibodies  Needs refills.  Component     Latest Ref Rng & Units 03/03/2021  Thyroglobulin     ng/mL <0.1 (L)  Comment        TSH     0.35 - 5.50 uIU/mL 7.97 (H)  T4,Free(Direct)     0.60 - 1.60 ng/dL 1.01  Thyroglobulin Ab     < or = 1 IU/mL <1  Excellent Tg + ATA, but the  TSH is elevated.  We will increase the Synthroid dose to 100 mcg daily and have her back for a recheck in 5 to 6 weeks.   Philemon Kingdom, MD PhD [facility] Endocr[facility]

## 2021-03-04 LAB — THYROGLOBULIN ANTIBODY: Thyroglobulin Ab: <1 IU/mL (ref <=1)

## 2021-03-04 LAB — THYROGLOBULIN LEVEL: Thyroglobulin: <0.1 ng/mL — ABNORMAL LOW

## 2021-03-04 MED ORDER — SYNTHROID 100 MCG PO TABS: 100.0000 ug | ORAL_TABLET | Freq: Every day | ORAL | 3 refills | Status: DC

## 2021-03-08 ENCOUNTER — Other Ambulatory Visit: Payer: Self-pay | Admitting: Internal Medicine

## 2021-03-21 DIAGNOSIS — E7800 Pure hypercholesterolemia, unspecified: Secondary | ICD-10-CM | POA: Diagnosis not present

## 2021-03-21 DIAGNOSIS — I10 Essential (primary) hypertension: Secondary | ICD-10-CM | POA: Diagnosis not present

## 2021-03-23 DIAGNOSIS — H26492 Other secondary cataract, left eye: Secondary | ICD-10-CM | POA: Diagnosis not present

## 2021-04-02 IMAGING — CT CT HEART SCORING
2 series · 16 of 20 positions shown, 18 images · non-contrast
Comparison: None.
COMPARISON: None.

Addendum:
EXAM:
OVER-READ INTERPRETATION  CT CHEST

The following report is an over-read performed by radiologist Dr.
over-read does not include interpretation of cardiac or coronary
anatomy or pathology. The cardiac CT calcium score interpretation by
the cardiologist is attached.
CLINICAL DATA: Risk stratification
Coronary Calcium Score
TECHNIQUE: The patient was scanned on a Siemens Force scanner. Axial
non-contrast 3 mm slices were carried out through the heart. The
data set was analyzed on a dedicated work station and scored using
the Agatson method.

[Series 2: casc 3.0 i36f 2 bestdiast 74 % · axial · 0.35mm/px · z∈[-216,-117]mm · 8 of 43 slices shown, 10 images]
[im 5/43  vessel]
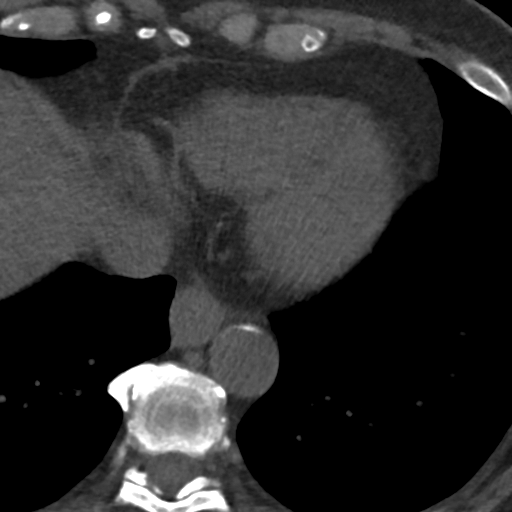
[im 5/43  lung]
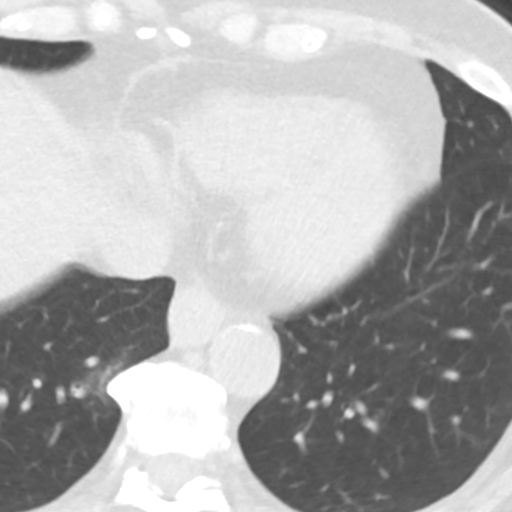
[im 10/43  vessel]
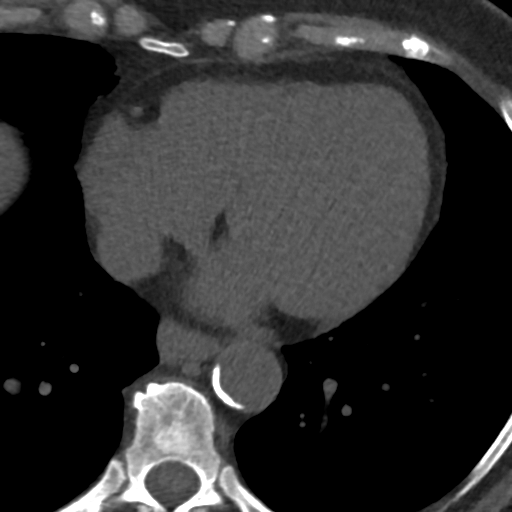
[im 15/43  vessel]
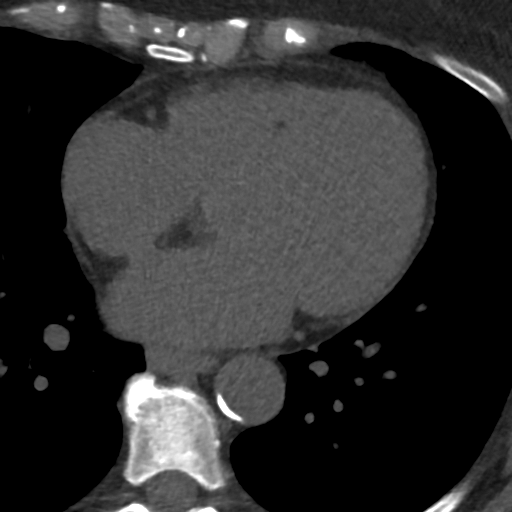
[im 19/43  vessel]
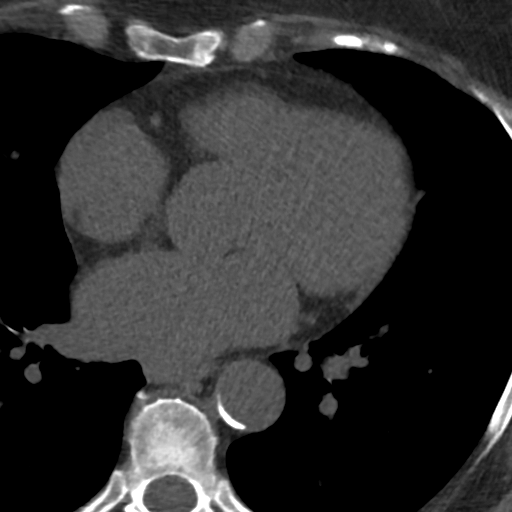
[im 24/43  vessel]
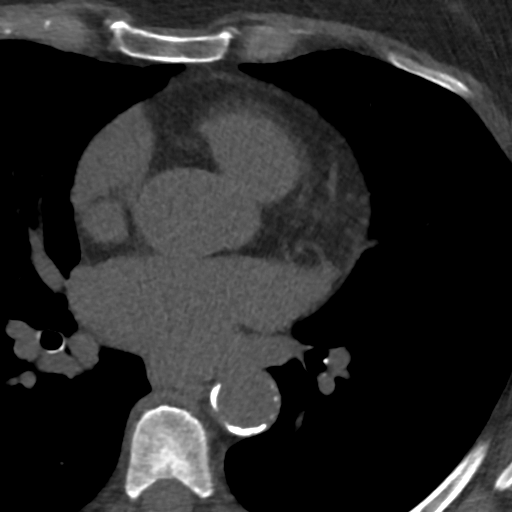
[im 24/43  lung]
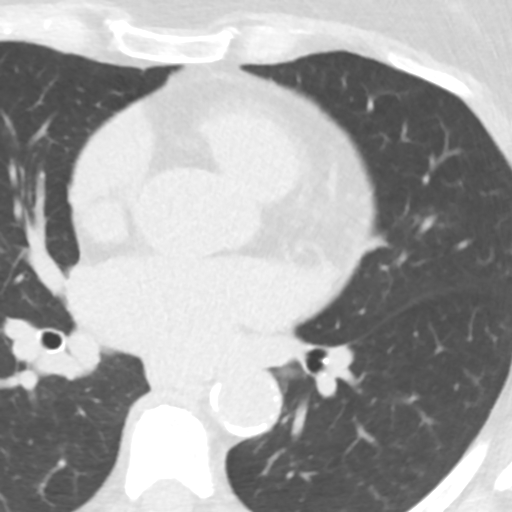
[im 29/43  vessel]
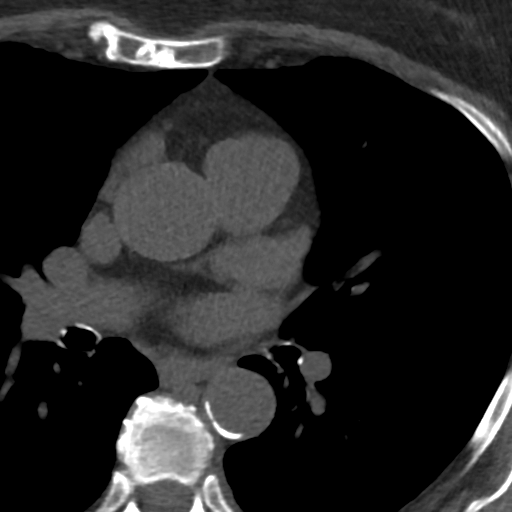
[im 33/43  vessel]
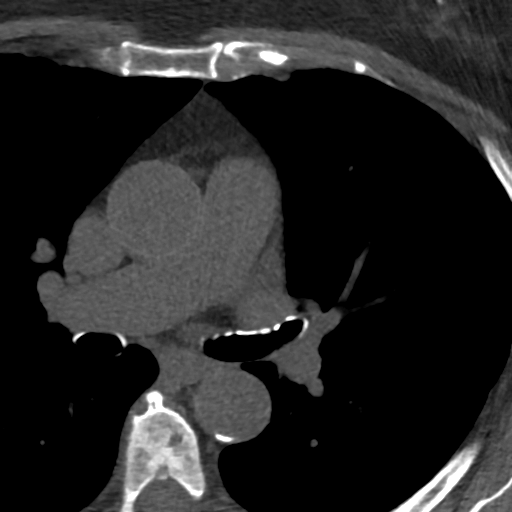
[im 38/43  vessel]
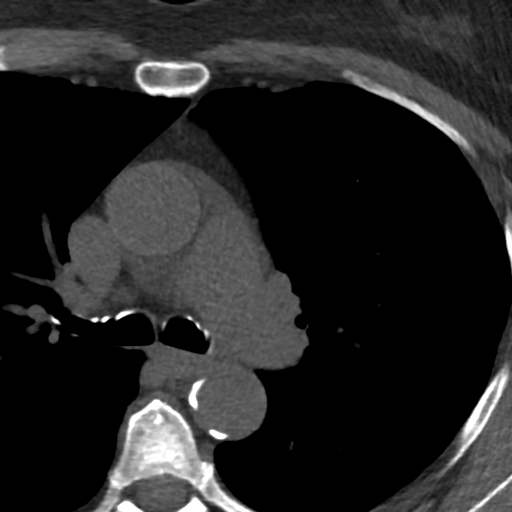

[Series 4: lung st 76 % · axial · 0.66mm/px · z∈[-216,-116]mm · 8 of 43 slices shown]
[im 5/43  lung]
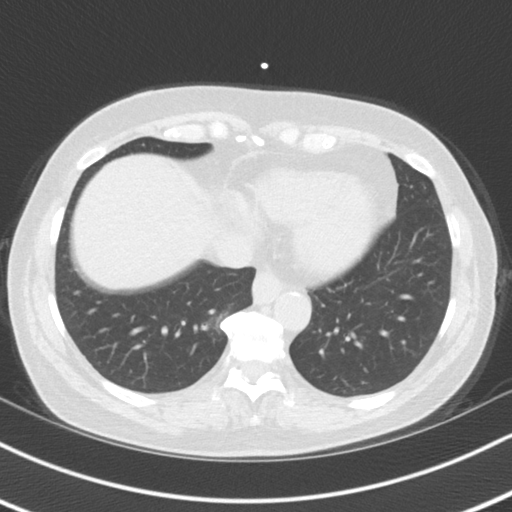
[im 10/43  lung]
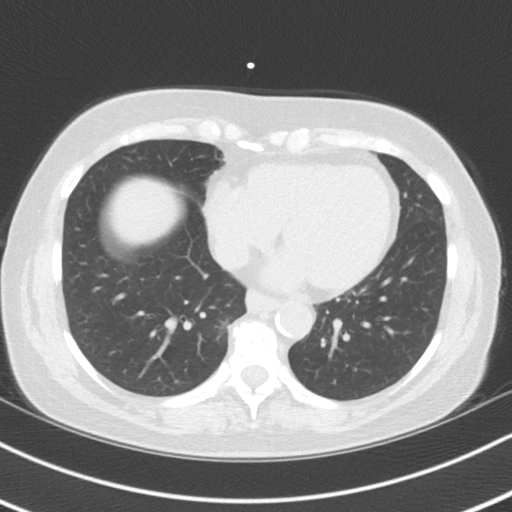
[im 15/43  lung]
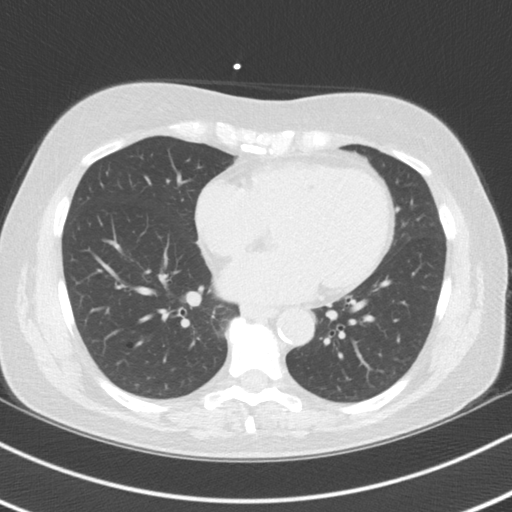
[im 19/43  lung]
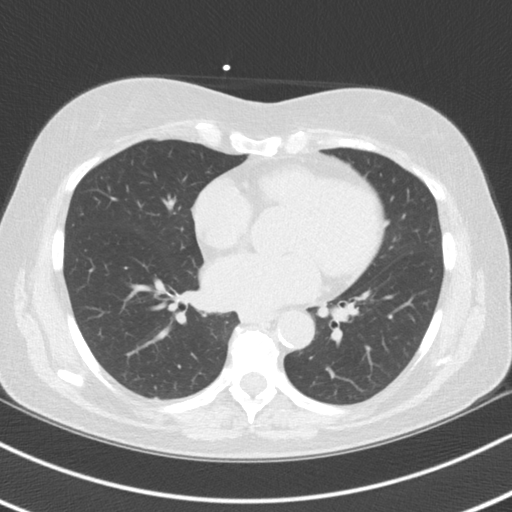
[im 24/43  lung]
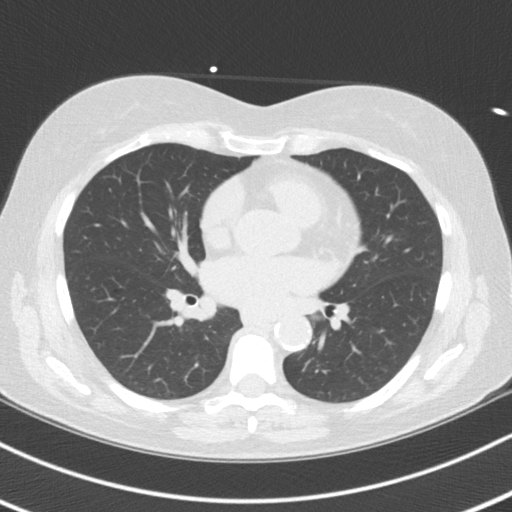
[im 29/43  lung]
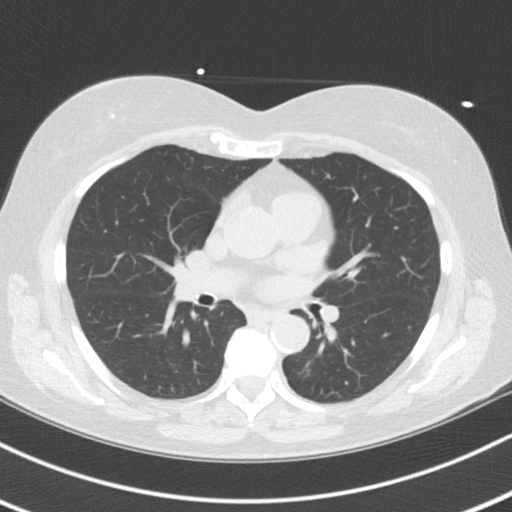
[im 33/43  lung]
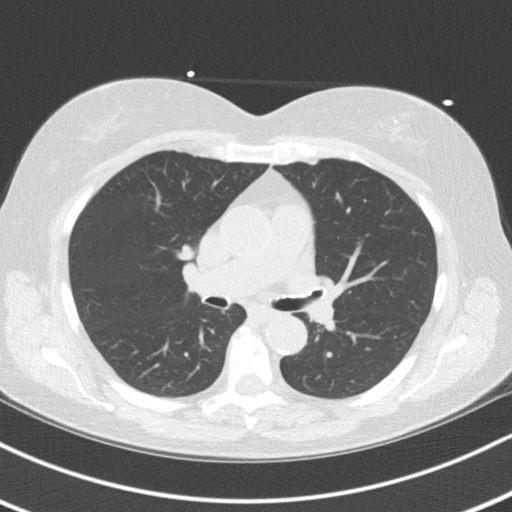
[im 38/43  lung]
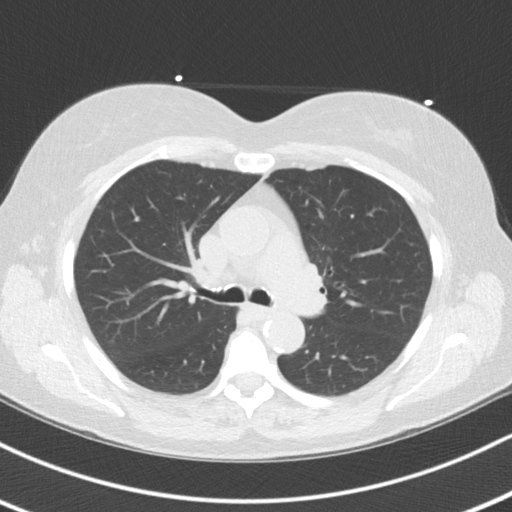

[16 of 20 positions shown; findings below may reference images not displayed]

FINDINGS: Limited view of the lung parenchyma demonstrates 5 mm RIGHT lobe
pulmonary nodule. Airways are normal.

Limited view of the mediastinum demonstrates no adenopathy.
Esophagus normal.

Limited view of the upper abdomen unremarkable.

Limited view of the skeleton and chest wall is unremarkable. Mild
sclerosis of the bones could represent medical renal disease.
IMPRESSION: 5 mm RIGHT lobe pulmonary nodule. Recommend follow-up CT without
contrast in 12 months per Fleischner criteria.

These results will be called to the ordering clinician or
representative by the Radiologist Assistant, and communication
documented in the PACS or zVision Dashboard.
FINDINGS: Non-cardiac: See separate report from [REDACTED].

Ascending Aorta: Normal Caliber. Mild calcifications of the aortic
root.

Pericardium: Normal

Coronary arteries: Normal coronary origins. Coronary calcifications
of the LAD and LCx.
IMPRESSION: Coronary calcium score of 122. This was 57th percentile for age and
sex matched control.

Engr Joseph Oni

*** End of Addendum ***
EXAM:
OVER-READ INTERPRETATION  CT CHEST

The following report is an over-read performed by radiologist Dr.
over-read does not include interpretation of cardiac or coronary
anatomy or pathology. The cardiac CT calcium score interpretation by
the cardiologist is attached.
FINDINGS: Limited view of the lung parenchyma demonstrates 5 mm RIGHT lobe
pulmonary nodule. Airways are normal.

Limited view of the mediastinum demonstrates no adenopathy.
Esophagus normal.

Limited view of the upper abdomen unremarkable.

Limited view of the skeleton and chest wall is unremarkable. Mild
sclerosis of the bones could represent medical renal disease.
IMPRESSION: 5 mm RIGHT lobe pulmonary nodule. Recommend follow-up CT without
contrast in 12 months per Fleischner criteria.

These results will be called to the ordering clinician or
representative by the Radiologist Assistant, and communication
documented in the PACS or zVision Dashboard.

## 2021-04-07 DIAGNOSIS — K8020 Calculus of gallbladder without cholecystitis without obstruction: Secondary | ICD-10-CM | POA: Diagnosis not present

## 2021-04-11 ENCOUNTER — Other Ambulatory Visit (INDEPENDENT_AMBULATORY_CARE_PROVIDER_SITE_OTHER): Payer: Medicare Other

## 2021-04-11 ENCOUNTER — Other Ambulatory Visit: Payer: Self-pay

## 2021-04-11 DIAGNOSIS — Z85850 Personal history of malignant neoplasm of thyroid: Secondary | ICD-10-CM

## 2021-04-11 DIAGNOSIS — E890 Postprocedural hypothyroidism: Secondary | ICD-10-CM | POA: Diagnosis not present

## 2021-04-11 LAB — T4, FREE: Free T4: 1.09 ng/dL (ref 0.60–1.60)

## 2021-04-11 LAB — TSH: TSH: 1.26 u[IU]/mL (ref 0.35–5.50)

## 2021-04-14 DIAGNOSIS — Z961 Presence of intraocular lens: Secondary | ICD-10-CM | POA: Diagnosis not present

## 2021-04-14 DIAGNOSIS — H18413 Arcus senilis, bilateral: Secondary | ICD-10-CM | POA: Diagnosis not present

## 2021-04-14 DIAGNOSIS — H40013 Open angle with borderline findings, low risk, bilateral: Secondary | ICD-10-CM | POA: Diagnosis not present

## 2021-04-14 DIAGNOSIS — H26492 Other secondary cataract, left eye: Secondary | ICD-10-CM | POA: Diagnosis not present

## 2021-04-20 ENCOUNTER — Other Ambulatory Visit: Payer: Self-pay

## 2021-04-20 ENCOUNTER — Encounter: Payer: Self-pay | Admitting: Internal Medicine

## 2021-04-20 ENCOUNTER — Ambulatory Visit: Payer: Medicare Other | Admitting: Internal Medicine

## 2021-04-20 VITALS — BP 132/60 | HR 64 | Ht 65.0 in | Wt 153.6 lb

## 2021-04-20 DIAGNOSIS — R0600 Dyspnea, unspecified: Secondary | ICD-10-CM

## 2021-04-20 NOTE — Progress Notes (Signed)
Cardiology Office Note:    Date:  04/20/2021   ID:  Lauren Mcgee, DOB 1940/08/13, MRN [id number]  PCP:  Cassandria Anger, MD   [facility] Providers Cardiologist:  None     Referring MD: Cassandria Anger, MD   No chief complaint on file. Fatigue  History of Present Illness:    Lauren Mcgee is a 81 y.o. female with a hx of GERD, HTN, HLD, hypothyroid  She was in the hospital for a UTI. She saw her gynecologist who said her breath was heavy.  During the day she does housework. After vacuuming a room or so for 15 minutes then she will have to sit down. This has not progressed. No chest pain or pressure. No orthopnea or PND. She had a stress test 10-15 years ago and it was normal. It was a treadmill, SPECT. No LHC. No cardiologist.  She smoked when she was 55 and stopped after pregnancy. Mother lived to be 33 and father 22. Mother had heart disease later in life and father with stroke. Two sons are healthy. She has brother and he is healthy. No stroke. No diabetes. She does not take the pravastatin and she was advised to stick with her fish oil. She kept loosing her balance with the statin medication. She has low B12, gets a shot every other week. Her symptoms are not progressing, this has been for about a year.  Her blood pressure is well controlled.   Interim Hx: She feels better today. No hospital visits.He denies angina, dyspnea on exertion, lower extremity edema, PND or orthopnea.  She is able to do her chores without limitations. She feels well   Cardiology Studies  TTE 12/13/2020 Normal LV/RV function No valve dx PA pressure 38 mmHg No pericardial effusion  Carotid duplex BL - normal  Past Medical History:  Diagnosis Date   Diverticulosis of colon (without mention of hemorrhage)    GERD (gastroesophageal reflux disease)    Hx of colonic polyp 2001   TUBULAR ADENOMA   Hyperlipidemia    Hypertension    Hypothyroidism    IBS (irritable bowel syndrome)    PVD  (peripheral vascular disease) ([place])    Thyroid cancer ([place]) 2011    Past Surgical History:  Procedure Laterality Date   ABDOMINAL HYSTERECTOMY  1991   COLONOSCOPY  2010   562.10   ESOPHAGOGASTRODUODENOSCOPY     multiple   THYROIDECTOMY  2010   and 131I treatment at [facility]- Dr. Celine Ahr    Current Medications: No outpatient medications have been marked as taking for the 04/20/21 encounter (Appointment) with Janina Mayo, MD.     Allergies:   Influenza vaccines, Lexapro [escitalopram], and Pravastatin   Social History   Socioeconomic History   Marital status: Married    Spouse name: Not on file   Number of children: 2   Years of education: Not on file   Highest education level: Not on file  Occupational History   Occupation: retired  Tobacco Use   Smoking status: Former    Packs/day: 0.50    Types: Cigarettes    Quit date: 03/06/1958    Years since quitting: 63.1   Smokeless tobacco: Never  Vaping Use   Vaping Use: Never used  Substance and Sexual Activity   Alcohol use: No   Drug use: No   Sexual activity: Yes  Other Topics Concern   Not on file  Social History Narrative   Retired   Married 2 kids  Regular exercise: not outside the home    Caffeine use: coffee daily   Former smoker, no alcohol, no drugs   Social Determinants of Radio broadcast assistant Strain: Low Risk    Difficulty of Paying Living Expenses: Not hard at all  Food Insecurity: No Food Insecurity   Worried About Charity fundraiser in the Last Year: Never true   Arboriculturist in the Last Year: Never true  Transportation Needs: No Transportation Needs   Lack of Transportation (Medical): No   Lack of Transportation (Non-Medical): No  Physical Activity: Sufficiently Active   Days of Exercise per Week: 5 days   Minutes of Exercise per Session: 30 min  Stress: No Stress Concern Present   Feeling of Stress : Not at all  Social Connections: Socially Integrated   Frequency of  Communication with Friends and Family: More than three times a week   Frequency of Social Gatherings with Friends and Family: Twice a week   Attends Religious Services: More than 4 times per year   Active Member of [employer] or Organizations: Yes   Attends Music therapist: More than 4 times per year   Marital Status: Married     Family History: The patient's family history includes Colon cancer (age of onset: 57) in her son; Colon cancer (age of onset: 58) in her paternal aunt; Heart disease in her mother; Hyperlipidemia in her mother; Hypertension in an other family member. There is no history of Colon polyps, Liver cancer, Pancreatic cancer, Rectal cancer, or Stomach cancer.  ROS:   Please see the history of present illness.     All other systems reviewed and are negative.  EKGs/Labs/Other Studies Reviewed:    The following studies were reviewed today:   EKG:  EKG is  ordered today.  The ekg ordered today demonstrates   01/11/2021 NSR,no scar, no acute ischemic changes   Recent Labs: 09/15/2020: ALT 20; BUN 14; Creatinine, Ser 1.00; Potassium 4.7; Sodium 133 04/11/2021: TSH 1.26  Recent Lipid Panel    Component Value Date/Time   CHOL 177 03/30/2020 1054   TRIG 199.0 (H) 03/30/2020 1054   HDL 44.00 03/30/2020 1054   CHOLHDL 4 03/30/2020 1054   VLDL 39.8 03/30/2020 1054   LDLCALC 93 03/30/2020 1054   LDLDIRECT 158.1 11/24/2008 0933     Risk Assessment/Calculations:           Physical Exam:    VS:    Vitals:   04/20/21 1111  BP: 132/60  Pulse: 64  SpO2: 96%     Wt Readings from Last 3 Encounters:  03/03/21 153 lb 12.8 oz (69.8 kg)  01/11/21 156 lb (70.8 kg)  12/14/20 152 lb 9.6 oz (69.2 kg)     GEN:  Well nourished, well developed in no acute distress HEENT: Normal NECK: No JVD LYMPHATICS: No lymphadenopathy CARDIAC: RRR, no murmurs, rubs, gallops RESPIRATORY:  Clear to auscultation without rales, wheezing or rhonchi  ABDOMEN: Soft,  non-tender, non-distended MUSCULOSKELETAL:  No edema; No deformity  SKIN: Warm and dry NEUROLOGIC:  Alert and oriented x 3 PSYCHIATRIC:  Normal affect   ASSESSMENT:    Dyspnea: Initially presented with dyspnea likely related to deconditioning. She has no signs of ischemia or CHF. Her echo is normal. She is doing well in follow up. Considering her symptoms are not cardiac related, recommend follow up on an as needed bases   PLAN:    In order of problems listed above:  Follow  up PRN        Medication Adjustments/Labs and Tests Ordered: Current medicines are reviewed at length with the patient today.  Concerns regarding medicines are outlined above.   Signed, Janina Mayo, MD  04/20/2021 8:07 AM    [place]

## 2021-04-20 NOTE — Patient Instructions (Signed)
Medication Instructions:  No Changes In Medications at this time.  *If you need a refill on your cardiac medications before your next appointment, please call your pharmacy*  Follow-Up: At [facility], you and your health needs are our priority.  As part of our continuing mission to provide you with exceptional heart care, we have created designated Provider Care Teams.  These Care Teams include your primary Cardiologist (physician) and Advanced Practice Providers (APPs -  Physician Assistants and Nurse Practitioners) who all work together to provide you with the care you need, when you need it.  We recommend signing up for the patient portal called "MyChart".  Sign up information is provided on this After Visit Summary.  MyChart is used to connect with patients for Virtual Visits (Telemedicine).  Patients are able to view lab/test results, encounter notes, upcoming appointments, etc.  Non-urgent messages can be sent to your provider as well.   To learn more about what you can do with MyChart, go to [email or web address].    Your next appointment:   AS NEEDED   The format for your next appointment:   In Person  Provider:   Branch, Mary E, MD   

## 2021-04-28 ENCOUNTER — Other Ambulatory Visit: Payer: Self-pay | Admitting: Internal Medicine

## 2021-05-24 ENCOUNTER — Other Ambulatory Visit: Payer: Self-pay | Admitting: Internal Medicine

## 2021-06-14 ENCOUNTER — Encounter: Payer: Self-pay | Admitting: Internal Medicine

## 2021-06-14 ENCOUNTER — Ambulatory Visit (INDEPENDENT_AMBULATORY_CARE_PROVIDER_SITE_OTHER): Payer: Medicare Other | Admitting: Internal Medicine

## 2021-06-14 DIAGNOSIS — E785 Hyperlipidemia, unspecified: Secondary | ICD-10-CM | POA: Diagnosis not present

## 2021-06-14 DIAGNOSIS — I2583 Coronary atherosclerosis due to lipid rich plaque: Secondary | ICD-10-CM | POA: Diagnosis not present

## 2021-06-14 DIAGNOSIS — G720 Drug-induced myopathy: Secondary | ICD-10-CM

## 2021-06-14 DIAGNOSIS — I2510 Atherosclerotic heart disease of native coronary artery without angina pectoris: Secondary | ICD-10-CM | POA: Diagnosis not present

## 2021-06-14 DIAGNOSIS — I10 Essential (primary) hypertension: Secondary | ICD-10-CM | POA: Diagnosis not present

## 2021-06-14 DIAGNOSIS — E538 Deficiency of other specified B group vitamins: Secondary | ICD-10-CM

## 2021-06-14 DIAGNOSIS — T466X5A Adverse effect of antihyperlipidemic and antiarteriosclerotic drugs, initial encounter: Secondary | ICD-10-CM

## 2021-06-14 DIAGNOSIS — Z0000 Encounter for general adult medical examination without abnormal findings: Secondary | ICD-10-CM | POA: Diagnosis not present

## 2021-06-14 LAB — CBC WITH DIFFERENTIAL/PLATELET
Basophils Absolute: 0 10*3/uL (ref 0.0–0.1)
Basophils Relative: 0.3 % (ref 0.0–3.0)
Eosinophils Absolute: 0.1 10*3/uL (ref 0.0–0.7)
Eosinophils Relative: 2 % (ref 0.0–5.0)
HCT: 39.5 % (ref 36.0–46.0)
Hemoglobin: 13.4 g/dL (ref 12.0–15.0)
Lymphocytes Relative: 21.5 % (ref 12.0–46.0)
Lymphs Abs: 1.3 10*3/uL (ref 0.7–4.0)
MCHC: 34 g/dL (ref 30.0–36.0)
MCV: 83.2 fl (ref 78.0–100.0)
Monocytes Absolute: 0.6 10*3/uL (ref 0.1–1.0)
Monocytes Relative: 9 % (ref 3.0–12.0)
Neutro Abs: 4.1 10*3/uL (ref 1.4–7.7)
Neutrophils Relative %: 67.2 % (ref 43.0–77.0)
Platelets: 167 10*3/uL (ref 150.0–400.0)
RBC: 4.75 Mil/uL (ref 3.87–5.11)
RDW: 14.2 % (ref 11.5–15.5)
WBC: 6.2 10*3/uL (ref 4.0–10.5)

## 2021-06-14 LAB — COMPREHENSIVE METABOLIC PANEL
ALT: 14 U/L (ref 0–35)
AST: 20 U/L (ref 0–37)
Albumin: 4.6 g/dL (ref 3.5–5.2)
Alkaline Phosphatase: 46 U/L (ref 39–117)
BUN: 12 mg/dL (ref 6–23)
CO2: 27 mEq/L (ref 19–32)
Calcium: 9.5 mg/dL (ref 8.4–10.5)
Chloride: 103 mEq/L (ref 96–112)
Creatinine, Ser: 0.82 mg/dL (ref 0.40–1.20)
GFR: 67.49 mL/min (ref >60.00)
Glucose, Bld: 93 mg/dL (ref 70–99)
Potassium: 4.3 mEq/L (ref 3.5–5.1)
Sodium: 139 mEq/L (ref 135–145)
Total Bilirubin: 0.8 mg/dL (ref 0.2–1.2)
Total Protein: 7.1 g/dL (ref 6.0–8.3)

## 2021-06-14 LAB — VITAMIN B12: Vitamin B-12: 381 pg/mL (ref 211–911)

## 2021-06-14 LAB — URINALYSIS
Bilirubin Urine: NEGATIVE
Ketones, ur: NEGATIVE
Leukocytes,Ua: NEGATIVE
Nitrite: NEGATIVE
Specific Gravity, Urine: 1.015 (ref 1.000–1.030)
Total Protein, Urine: NEGATIVE
Urine Glucose: NEGATIVE
Urobilinogen, UA: 0.2 (ref 0.0–1.0)
pH: 6 (ref 5.0–8.0)

## 2021-06-14 LAB — LIPID PANEL
Cholesterol: 208 mg/dL — ABNORMAL HIGH (ref 0–200)
HDL: 43.2 mg/dL (ref >39.00)
NonHDL: 165.05
Total CHOL/HDL Ratio: 5
Triglycerides: 242 mg/dL — ABNORMAL HIGH (ref 0.0–149.0)
VLDL: 48.4 mg/dL — ABNORMAL HIGH (ref 0.0–40.0)

## 2021-06-14 LAB — LDL CHOLESTEROL, DIRECT: Direct LDL: 137 mg/dL

## 2021-06-14 LAB — TSH: TSH: 0.45 u[IU]/mL (ref 0.35–5.50)

## 2021-06-14 NOTE — Progress Notes (Signed)
? ?Subjective:  ?Patient ID: Lauren Mcgee, female    DOB: 10/27/1940  Age: 81 y.o. MRN: [id number] ? ?CC: No chief complaint on file. ? ? ?HPI ?Lauren Mcgee presents for CAD, HTN, B12 def ? ?Outpatient Medications Prior to Visit  ?Medication Sig Dispense Refill  ? aspirin 81 MG chewable tablet Chew 81 mg by mouth daily.    ? atenolol (TENORMIN) 25 MG tablet TAKE 1 TABLET BY MOUTH EVERY DAY 90 tablet 3  ? cholecalciferol (VITAMIN D) 1000 UNITS tablet Take 1,000 Units by mouth daily.    ? cyanocobalamin (,VITAMIN B-12,) 1000 MCG/ML injection Give 1000 mcg of Vitamin B12 sq once every 2 weeks 10 mL 5  ? DENTA 5000 PLUS 1.1 % CREA dental cream     ? escitalopram (LEXAPRO) 20 MG tablet Take 20 mg by mouth daily.    ? estradiol (ESTRACE) 0.1 MG/GM vaginal cream Place 1 Applicatorful vaginally at bedtime.    ? FLUoxetine (PROZAC) 20 MG capsule Take 1 capsule (20 mg total) by mouth daily. 90 capsule 3  ? fluticasone (FLONASE) 50 MCG/ACT nasal spray Place into both nostrils daily.    ? nitrofurantoin, macrocrystal-monohydrate, (MACROBID) 100 MG capsule Take 100 mg by mouth at bedtime. Take 1 by mouth at bedtime    ? Omega-3 Fatty Acids (FISH OIL) 1000 MG CAPS 1 po bid 100 capsule 3  ? omeprazole (PRILOSEC OTC) 20 MG tablet Take 20 mg by mouth daily.    ? SYNTHROID 100 MCG tablet Take 1 tablet (100 mcg total) by mouth daily before breakfast. 45 tablet 3  ? SYRINGE-NEEDLE, DISP, 3 ML (BD ECLIPSE SYRINGE) 25G X 1" 3 ML MISC As directed for B12 inj 50 each 3  ? Turmeric (QC TUMERIC COMPLEX PO) Take 475 mg by mouth daily.    ? atorvastatin (LIPITOR) 10 MG tablet Take 10 mg by mouth daily.    ? carvedilol (COREG) 6.25 MG tablet Take 6.25 mg by mouth 2 (two) times daily with a meal. (Patient not taking: Reported on 06/14/2021)    ? cefUROXime (CEFTIN) 250 MG tablet Take 250 mg by mouth 2 (two) times daily with a meal.    ? ciprofloxacin (CIPRO) 500 MG tablet Take 500 mg by mouth 2 (two) times daily.    ? phenazopyridine (PYRIDIUM)  100 MG tablet Take 100 mg by mouth 3 (three) times daily as needed for pain.    ? sulfamethoxazole-trimethoprim (BACTRIM DS) 800-160 MG tablet SMARTSIG:1 Tablet(s) By Mouth Every 12 Hours    ? ?No facility-administered medications prior to visit.  ? ? ?ROS: ?Review of Systems  ?Constitutional:  Negative for activity change, appetite change, chills, fatigue and unexpected weight change.  ?HENT:  Negative for congestion, mouth sores and sinus pressure.   ?Eyes:  Negative for visual disturbance.  ?Respiratory:  Negative for cough and chest tightness.   ?Gastrointestinal:  Negative for abdominal pain and nausea.  ?Genitourinary:  Negative for difficulty urinating, frequency and vaginal pain.  ?Musculoskeletal:  Positive for arthralgias. Negative for back pain and gait problem.  ?Skin:  Negative for pallor and rash.  ?Neurological:  Negative for dizziness, tremors, weakness, numbness and headaches.  ?Psychiatric/Behavioral:  Negative for confusion and sleep disturbance.   ? ?Objective:  ?BP 118/68 (BP Location: Left Arm, Patient Position: Sitting, Cuff Size: Large)   Pulse 65   Temp 98.3 ?F (36.8 ?C) (Oral)   Ht '5\' 5"'$  (1.651 m)   Wt 153 lb (69.4 kg)   SpO2 95%  BMI 25.46 kg/m?  ? ?BP Readings from Last 3 Encounters:  ?06/14/21 118/68  ?04/20/21 132/60  ?03/03/21 128/80  ? ? ?Wt Readings from Last 3 Encounters:  ?06/14/21 153 lb (69.4 kg)  ?04/20/21 153 lb 9.6 oz (69.7 kg)  ?03/03/21 153 lb 12.8 oz (69.8 kg)  ? ? ?Physical Exam ?Constitutional:   ?   General: She is not in acute distress. ?   Appearance: She is well-developed. She is obese.  ?HENT:  ?   Head: Normocephalic.  ?   Right Ear: External ear normal.  ?   Left Ear: External ear normal.  ?   Nose: Nose normal.  ?Eyes:  ?   General:     ?   Right eye: No discharge.     ?   Left eye: No discharge.  ?   Conjunctiva/sclera: Conjunctivae normal.  ?   Pupils: Pupils are equal, round, and reactive to light.  ?Neck:  ?   Thyroid: No thyromegaly.  ?   Vascular: No  JVD.  ?   Trachea: No tracheal deviation.  ?Cardiovascular:  ?   Rate and Rhythm: Normal rate and regular rhythm.  ?   Heart sounds: Normal heart sounds.  ?Pulmonary:  ?   Effort: No respiratory distress.  ?   Breath sounds: No stridor. No wheezing.  ?Abdominal:  ?   General: Bowel sounds are normal. There is no distension.  ?   Palpations: Abdomen is soft. There is no mass.  ?   Tenderness: There is no abdominal tenderness. There is no guarding or rebound.  ?Musculoskeletal:     ?   General: No tenderness.  ?   Cervical back: Normal range of motion and neck supple. No rigidity.  ?Lymphadenopathy:  ?   Cervical: No cervical adenopathy.  ?Skin: ?   Findings: No erythema or rash.  ?Neurological:  ?   Mental Status: She is oriented to person, place, and time.  ?   Cranial Nerves: No cranial nerve deficit.  ?   Motor: No abnormal muscle tone.  ?   Coordination: Coordination normal.  ?   Deep Tendon Reflexes: Reflexes normal.  ?Psychiatric:     ?   Behavior: Behavior normal.     ?   Thought Content: Thought content normal.     ?   Judgment: Judgment normal.  ? ? ?Lab Results  ?Component Value Date  ? WBC 6.2 03/30/2020  ? HGB 14.5 03/30/2020  ? HCT 41.8 03/30/2020  ? PLT 172.0 03/30/2020  ? GLUCOSE 101 (H) 09/15/2020  ? CHOL 177 03/30/2020  ? TRIG 199.0 (H) 03/30/2020  ? HDL 44.00 03/30/2020  ? LDLDIRECT 158.1 11/24/2008  ? [place] 93 03/30/2020  ? ALT 20 09/15/2020  ? AST 27 09/15/2020  ? NA 133 (L) 09/15/2020  ? K 4.7 09/15/2020  ? CL 102 09/15/2020  ? CREATININE 1.00 09/15/2020  ? BUN 14 09/15/2020  ? CO2 24 09/15/2020  ? TSH 1.26 04/11/2021  ? HGBA1C 5.7 09/15/2020  ? ? ?CT Chest Wo Contrast ? ?Result Date: 02/03/2020 ?CLINICAL DATA:  Lung nodule follow-up. EXAM: CT CHEST WITHOUT CONTRAST TECHNIQUE: Multidetector CT imaging of the chest was performed following the standard protocol without IV contrast. COMPARISON:  CT chest dated June 27, 2019. FINDINGS: Cardiovascular: No significant vascular findings. Normal heart  size. No pericardial effusion. No thoracic aortic aneurysm. Coronary, aortic arch, and branch vessel atherosclerotic vascular disease. Mediastinum/Nodes: No enlarged mediastinal or axillary lymph nodes. Thyroid gland, trachea, and  esophagus demonstrate no significant findings. Lungs/Pleura: Unchanged slightly irregular 7 mm nodule in the superior segment of the right lower lobe (series 3, image 76), stable since October 2020. 4 mm ground-glass nodule in the right upper lobe (series 3, image 39) is also unchanged since the prior study. No new pulmonary nodule. No focal consolidation, pleural effusion, or pneumothorax. Unchanged mild biapical pleuroparenchymal scarring. Upper Abdomen: No acute abnormality. Musculoskeletal: No acute or significant osseous findings. Unchanged small mixed sclerotic and lucent lesion in the sternal body, stable since October 2020, benign. IMPRESSION: 1. Unchanged 7 mm nodule in the superior segment of the right lower lobe. No further follow-up required. 2. Additional 4 mm ground-glass nodule in the right upper lobe is also unchanged since October 2020. No further follow-up recommended. 3. Aortic Atherosclerosis (ICD10-I70.0). Electronically Signed   By: Titus Dubin M.D.   On: 02/03/2020 16:59  ? ? ?Assessment & Plan:  ? ?Problem List Items Addressed This Visit   ? ? Dyslipidemia  ?  Statin intolerant ?On Fish oil ?  ?  ? Essential hypertension  ?  Chronic  ? ?Cont w/Atenolol Rx ?  ?  ? Coronary atherosclerosis  ?  Statin intolerant ?On Fish oil ?  ?  ? B12 deficiency  ?  On B12 inj q 2 wks ?  ?  ? Statin myopathy  ?  Statin intolerant ?On Fish oil ?  ?  ?  ? ? ?No orders of the defined types were placed in this encounter. ?  ? ? ?Follow-up: No follow-ups on file. ? ?Walker Kehr, MD ?

## 2021-06-14 NOTE — Assessment & Plan Note (Signed)
Statin intolerant ?On Fish oil ?

## 2021-06-14 NOTE — Assessment & Plan Note (Signed)
Chronic  ? ?Cont w/Atenolol Rx ?

## 2021-06-14 NOTE — Assessment & Plan Note (Signed)
On B12 inj q 2 wks ?

## 2021-07-04 ENCOUNTER — Telehealth: Payer: Self-pay | Admitting: Internal Medicine

## 2021-07-04 ENCOUNTER — Other Ambulatory Visit: Payer: Self-pay | Admitting: Internal Medicine

## 2021-07-04 NOTE — Telephone Encounter (Signed)
Pt checking status of 06-14-2021 lab results ? ?Informed pt of provider's 06-15-2021 result note  ? ?Pt requesting a c/b to discuss lipid levels ? ? ?Pt also states a spot was found on her lung at her 03-03-2021 endo visit ? ?Pt inquiring if provider was aware and requesting provider's recommendations ?

## 2021-07-06 NOTE — Telephone Encounter (Signed)
Tried to call pt to discuss concerns about her labs. No answer and tos Mailbox is full so I was unable to LVM at this time. ?

## 2021-07-06 NOTE — Telephone Encounter (Signed)
Pt returning call, cma unavailable ? ?Please call pt back ?

## 2021-07-07 NOTE — Telephone Encounter (Signed)
There is my comments attached to the labs for my chart.  Continue with fish oil.  Thank you ?

## 2021-07-07 NOTE — Telephone Encounter (Signed)
Call pt no answer LMOM RTC.../lmb 

## 2021-07-08 NOTE — Telephone Encounter (Signed)
Called pt gave her MD response on labs. Pt states they failed to mention to him that she had a spot on her lungs abt 2 years ago. Last year they order a scan and it had not grown. Pt is wanting another scan done to f/u up spot on her lungs../lm\b ?

## 2021-07-11 NOTE — Telephone Encounter (Signed)
No further CT follow-up was recommended by Radiology. ?Thx ?

## 2021-07-11 NOTE — Telephone Encounter (Signed)
Called pt no answer LMOM w/MD response../lmb 

## 2021-07-19 DIAGNOSIS — Z0000 Encounter for general adult medical examination without abnormal findings: Secondary | ICD-10-CM | POA: Diagnosis not present

## 2021-07-19 DIAGNOSIS — R911 Solitary pulmonary nodule: Secondary | ICD-10-CM | POA: Diagnosis not present

## 2021-07-19 DIAGNOSIS — E7800 Pure hypercholesterolemia, unspecified: Secondary | ICD-10-CM | POA: Diagnosis not present

## 2021-07-19 DIAGNOSIS — I10 Essential (primary) hypertension: Secondary | ICD-10-CM | POA: Diagnosis not present

## 2021-07-19 DIAGNOSIS — E039 Hypothyroidism, unspecified: Secondary | ICD-10-CM | POA: Diagnosis not present

## 2021-07-21 ENCOUNTER — Telehealth: Payer: Self-pay | Admitting: Internal Medicine

## 2021-07-21 NOTE — Telephone Encounter (Signed)
LM with husband to rtn my call to schedule AWV with [facility]. Call back # [phone]

## 2021-07-25 ENCOUNTER — Ambulatory Visit (INDEPENDENT_AMBULATORY_CARE_PROVIDER_SITE_OTHER): Payer: Medicare Other

## 2021-07-25 VITALS — BP 120/60 | HR 76 | Temp 97.6°F | Resp 16 | Ht 65.0 in | Wt 152.2 lb

## 2021-07-25 DIAGNOSIS — Z0000 Encounter for general adult medical examination without abnormal findings: Secondary | ICD-10-CM

## 2021-07-25 NOTE — Patient Instructions (Signed)
Lauren Mcgee , Thank you for taking time to come for your Medicare Wellness Visit. I appreciate your ongoing commitment to your health goals. Please review the following plan we discussed and let me know if I can assist you in the future.   Screening recommendations/referrals: Colonoscopy: Discontinued due to age; last done 10/18/2016 Mammogram: 02/09/2021 Bone Density: no record Recommended yearly ophthalmology/optometry visit for glaucoma screening and checkup Recommended yearly dental visit for hygiene and checkup  Vaccinations: Influenza vaccine: 12/14/2020 Pneumococcal vaccine: 11/24/2008 (Pneumococcal 23), 09/15/2020 (Prevnar 20, Prevnar 13) Tdap vaccine: 06/04/2012; due every 10 years (due 06/05/2022) Shingles vaccine: never done   Covid-19: 03/21/2019, 04/18/2019, 01/02/2020, 07/23/2020  Advanced directives: No  Conditions/risks identified: Yes  Next appointment: Please schedule your next Medicare Wellness Visit with your Nurse Health Advisor in 1 year by calling [phone].   Preventive Care 36 Years and Older, Female Preventive care refers to lifestyle choices and visits with your health care provider that can promote health and wellness. What does preventive care include? A yearly physical exam. This is also called an annual well check. Dental exams once or twice a year. Routine eye exams. Ask your health care provider how often you should have your eyes checked. Personal lifestyle choices, including: Daily care of your teeth and gums. Regular physical activity. Eating a healthy diet. Avoiding tobacco and drug use. Limiting alcohol use. Practicing safe sex. Taking low-dose aspirin every day. Taking vitamin and mineral supplements as recommended by your health care provider. What happens during an annual well check? The services and screenings done by your health care provider during your annual well check will depend on your age, overall health, lifestyle risk factors, and  family history of disease. Counseling  Your health care provider may ask you questions about your: Alcohol use. Tobacco use. Drug use. Emotional well-being. Home and relationship well-being. Sexual activity. Eating habits. History of falls. Memory and ability to understand (cognition). Work and work Statistician. Reproductive health. Screening  You may have the following tests or measurements: Height, weight, and BMI. Blood pressure. Lipid and cholesterol levels. These may be checked every 5 years, or more frequently if you are over 64 years old. Skin check. Lung cancer screening. You may have this screening every year starting at age 3 if you have a 30-pack-year history of smoking and currently smoke or have quit within the past 15 years. Fecal occult blood test (FOBT) of the stool. You may have this test every year starting at age 58. Flexible sigmoidoscopy or colonoscopy. You may have a sigmoidoscopy every 5 years or a colonoscopy every 10 years starting at age 65. Hepatitis C blood test. Hepatitis B blood test. Sexually transmitted disease (STD) testing. Diabetes screening. This is done by checking your blood sugar (glucose) after you have not eaten for a while (fasting). You may have this done every 1-3 years. Bone density scan. This is done to screen for osteoporosis. You may have this done starting at age 72. Mammogram. This may be done every 1-2 years. Talk to your health care provider about how often you should have regular mammograms. Talk with your health care provider about your test results, treatment options, and if necessary, the need for more tests. Vaccines  Your health care provider may recommend certain vaccines, such as: Influenza vaccine. This is recommended every year. Tetanus, diphtheria, and acellular pertussis (Tdap, Td) vaccine. You may need a Td booster every 10 years. Zoster vaccine. You may need this after age 25. Pneumococcal 13-valent conjugate  (  PCV13) vaccine. One dose is recommended after age 63. Pneumococcal polysaccharide (PPSV23) vaccine. One dose is recommended after age 97. Talk to your health care provider about which screenings and vaccines you need and how often you need them. This information is not intended to replace advice given to you by your health care provider. Make sure you discuss any questions you have with your health care provider. Document Released: 03/19/2015 Document Revised: 11/10/2015 Document Reviewed: 12/22/2014 Elsevier Interactive Patient Education  2017 [place] Prevention in the Home Falls can cause injuries. They can happen to people of all ages. There are many things you can do to make your home safe and to help prevent falls. What can I do on the outside of my home? Regularly fix the edges of walkways and driveways and fix any cracks. Remove anything that might make you trip as you walk through a door, such as a raised step or threshold. Trim any bushes or trees on the path to your home. Use bright outdoor lighting. Clear any walking paths of anything that might make someone trip, such as rocks or tools. Regularly check to see if handrails are loose or broken. Make sure that both sides of any steps have handrails. Any raised decks and porches should have guardrails on the edges. Have any leaves, snow, or ice cleared regularly. Use sand or salt on walking paths during winter. Clean up any spills in your garage right away. This includes oil or grease spills. What can I do in the bathroom? Use night lights. Install grab bars by the toilet and in the tub and shower. Do not use towel bars as grab bars. Use non-skid mats or decals in the tub or shower. If you need to sit down in the shower, use a plastic, non-slip stool. Keep the floor dry. Clean up any water that spills on the floor as soon as it happens. Remove soap buildup in the tub or shower regularly. Attach bath mats securely with  double-sided non-slip rug tape. Do not have throw rugs and other things on the floor that can make you trip. What can I do in the bedroom? Use night lights. Make sure that you have a light by your bed that is easy to reach. Do not use any sheets or blankets that are too big for your bed. They should not hang down onto the floor. Have a firm chair that has side arms. You can use this for support while you get dressed. Do not have throw rugs and other things on the floor that can make you trip. What can I do in the kitchen? Clean up any spills right away. Avoid walking on wet floors. Keep items that you use a lot in easy-to-reach places. If you need to reach something above you, use a strong step stool that has a grab bar. Keep electrical cords out of the way. Do not use floor polish or wax that makes floors slippery. If you must use wax, use non-skid floor wax. Do not have throw rugs and other things on the floor that can make you trip. What can I do with my stairs? Do not leave any items on the stairs. Make sure that there are handrails on both sides of the stairs and use them. Fix handrails that are broken or loose. Make sure that handrails are as long as the stairways. Check any carpeting to make sure that it is firmly attached to the stairs. Fix any carpet that is loose  or worn. Avoid having throw rugs at the top or bottom of the stairs. If you do have throw rugs, attach them to the floor with carpet tape. Make sure that you have a light switch at the top of the stairs and the bottom of the stairs. If you do not have them, ask someone to add them for you. What else can I do to help prevent falls? Wear shoes that: Do not have high heels. Have rubber bottoms. Are comfortable and fit you well. Are closed at the toe. Do not wear sandals. If you use a stepladder: Make sure that it is fully opened. Do not climb a closed stepladder. Make sure that both sides of the stepladder are locked  into place. Ask someone to hold it for you, if possible. Clearly mark and make sure that you can see: Any grab bars or handrails. First and last steps. Where the edge of each step is. Use tools that help you move around (mobility aids) if they are needed. These include: Canes. Walkers. Scooters. Crutches. Turn on the lights when you go into a dark area. Replace any light bulbs as soon as they burn out. Set up your furniture so you have a clear path. Avoid moving your furniture around. If any of your floors are uneven, fix them. If there are any pets around you, be aware of where they are. Review your medicines with your doctor. Some medicines can make you feel dizzy. This can increase your chance of falling. Ask your doctor what other things that you can do to help prevent falls. This information is not intended to replace advice given to you by your health care provider. Make sure you discuss any questions you have with your health care provider. Document Released: 12/17/2008 Document Revised: 07/29/2015 Document Reviewed: 03/27/2014 Elsevier Interactive Patient Education  2017 [employer].

## 2021-07-25 NOTE — Progress Notes (Cosign Needed Addendum)
Subjective:   Lauren Mcgee is a 81 y.o. female who presents for Medicare Annual (Subsequent) preventive examination.  Review of Systems     Cardiac Risk Factors include: advanced age (>91mn, >>67women);dyslipidemia;family history of premature cardiovascular disease;hypertension     Objective:    Today's Vitals   07/25/21 1106  BP: 120/60  Pulse: 76  Resp: 16  Temp: 97.6 F (36.4 C)  TempSrc: Temporal  SpO2: 94%  Weight: 152 lb 3.2 oz (69 kg)  Height: '5\' 5"'$  (1.651 m)  PainSc: 0-No pain   Body mass index is 25.33 kg/m.     07/25/2021   11:52 AM 07/23/2020   12:57 PM 08/18/2019   11:50 AM 10/04/2016    1:35 PM  Advanced Directives  Does Patient Have a Medical Advance Directive? No Yes No No  Type of Advance Directive  [place]   Does patient want to make changes to medical advance directive?  No - Patient declined    [place] of [place]?  No - copy requested    Would patient like information on creating a medical advance directive? No - Patient declined  No - Patient declined     Current Medications (verified) Outpatient Encounter Medications as of 07/25/2021  Medication Sig   aspirin 81 MG chewable tablet Chew 81 mg by mouth daily.   atenolol (TENORMIN) 25 MG tablet TAKE 1 TABLET BY MOUTH EVERY DAY   carvedilol (COREG) 6.25 MG tablet Take 6.25 mg by mouth 2 (two) times daily with a meal. (Patient not taking: Reported on 06/14/2021)   cholecalciferol (VITAMIN D) 1000 UNITS tablet Take 1,000 Units by mouth daily.   cyanocobalamin (,VITAMIN B-12,) 1000 MCG/ML injection Give 1000 mcg of Vitamin B12 sq once every 2 weeks   DENTA 5000 PLUS 1.1 % CREA dental cream    escitalopram (LEXAPRO) 20 MG tablet Take 20 mg by mouth daily.   estradiol (ESTRACE) 0.1 MG/GM vaginal cream Place 1 Applicatorful vaginally at bedtime.   FLUoxetine (PROZAC) 20 MG capsule Take 1 capsule (20 mg total) by mouth daily.   fluticasone (FLONASE) 50  MCG/ACT nasal spray Place into both nostrils daily.   nitrofurantoin, macrocrystal-monohydrate, (MACROBID) 100 MG capsule Take 100 mg by mouth at bedtime. Take 1 by mouth at bedtime   Omega-3 Fatty Acids (FISH OIL) 1000 MG CAPS 1 po bid   omeprazole (PRILOSEC OTC) 20 MG tablet Take 20 mg by mouth daily.   SYNTHROID 100 MCG tablet Take 1 tablet (100 mcg total) by mouth daily before breakfast.   SYRINGE-NEEDLE, DISP, 3 ML (BD ECLIPSE SYRINGE) 25G X 1" 3 ML MISC As directed for B12 inj   Turmeric (QC TUMERIC COMPLEX PO) Take 475 mg by mouth daily.   No facility-administered encounter medications on file as of 07/25/2021.    Allergies (verified) Influenza vaccines, Lexapro [escitalopram], and Pravastatin   History: Past Medical History:  Diagnosis Date   Diverticulosis of colon (without mention of hemorrhage)    GERD (gastroesophageal reflux disease)    Hx of colonic polyp 2001   TUBULAR ADENOMA   Hyperlipidemia    Hypertension    Hypothyroidism    IBS (irritable bowel syndrome)    PVD (peripheral vascular disease) ([place]    Thyroid cancer ([place] 2011   Past Surgical History:  Procedure Laterality Date   ABDOMINAL HYSTERECTOMY  1991   COLONOSCOPY  2010   562.10   ESOPHAGOGASTRODUODENOSCOPY     multiple  THYROIDECTOMY  2010   and 131I treatment at [facility]- Dr. Celine Ahr   Family History  Problem Relation Age of Onset   Hyperlipidemia Mother    Heart disease Mother    Hypertension Other    Colon cancer Paternal Aunt 55   Colon cancer Son 32   Colon polyps Neg Hx    Liver cancer Neg Hx    Pancreatic cancer Neg Hx    Rectal cancer Neg Hx    Stomach cancer Neg Hx    Social History   Socioeconomic History   Marital status: Married    Spouse name: Not on file   Number of children: 2   Years of education: Not on file   Highest education level: Not on file  Occupational History   Occupation: retired  Tobacco Use   Smoking status: Former    Packs/day: 0.50    Types:  Cigarettes    Quit date: 03/06/1958    Years since quitting: 63.4   Smokeless tobacco: Never  Vaping Use   Vaping Use: Never used  Substance and Sexual Activity   Alcohol use: No   Drug use: No   Sexual activity: Yes  Other Topics Concern   Not on file  Social History Narrative   Retired   Married 2 kids   Regular exercise: not outside the home    Caffeine use: coffee daily   Former smoker, no alcohol, no drugs   Social Determinants of Radio broadcast assistant Strain: Low Risk    Difficulty of Paying Living Expenses: Not hard at all  Food Insecurity: No Food Insecurity   Worried About Charity fundraiser in the Last Year: Never true   Arboriculturist in the Last Year: Never true  Transportation Needs: No Transportation Needs   Lack of Transportation (Medical): No   Lack of Transportation (Non-Medical): No  Physical Activity: Sufficiently Active   Days of Exercise per Week: 5 days   Minutes of Exercise per Session: 30 min  Stress: No Stress Concern Present   Feeling of Stress : Not at all  Social Connections: Socially Integrated   Frequency of Communication with Friends and Family: More than three times a week   Frequency of Social Gatherings with Friends and Family: Twice a week   Attends Religious Services: More than 4 times per year   Active Member of [employer] or Organizations: Yes   Attends Music therapist: More than 4 times per year   Marital Status: Married    Tobacco Counseling Counseling given: Not Answered   Clinical Intake:  Pre-visit preparation completed: Yes  Pain : No/denies pain Pain Score: 0-No pain     BMI - recorded: 25.33 Nutritional Status: BMI 25 -29 Overweight Nutritional Risks: None Diabetes: No  How often do you need to have someone help you when you read instructions, pamphlets, or other written materials from your doctor or pharmacy?: 1 - Never What is the last grade level you completed in school?: HSG;  Certification  Diabetic? no  Interpreter Needed?: No  Information entered by :: Lisette Abu, LPN.   Activities of Daily Living    07/25/2021   11:53 AM  In your present state of health, do you have any difficulty performing the following activities:  Hearing? 1  Comment wears hearing aids  Vision? 0  Difficulty concentrating or making decisions? 0  Walking or climbing stairs? 0  Dressing or bathing? 0  Doing errands, shopping? 0  Preparing  Food and eating ? N  Using the Toilet? N  In the past six months, have you accidently leaked urine? N  Do you have problems with loss of bowel control? N  Managing your Medications? N  Managing your Finances? N  Housekeeping or managing your Housekeeping? N    Patient Care Team: Plotnikov, Evie Lacks, MD as PCP - [facility], Royetta Crochet, MD as PCP - Cardiology (Cardiology) Philemon Kingdom, MD as Consulting Physician (Internal Medicine) Darleen Crocker, MD as Consulting Physician (Ophthalmology) Dian Queen, MD as Consulting Physician (Obstetrics and Gynecology) Lynnell Dike, OD as Consulting Physician (Optometry)  Indicate any recent Medical Services you may have received from other than [facility] providers in the past year (date may be approximate).     Assessment:   This is a routine wellness examination for Reise.  Hearing/Vision screen Hearing Screening - Comments:: Patient has hearing difficulty and wears hearing aids. Vision Screening - Comments:: Patient does wear corrective lenses/contacts.  Eye exam done by: Cleon Gustin, OD.   Dietary issues and exercise activities discussed: Current Exercise Habits: Home exercise routine, Type of exercise: walking, Time (Minutes): 30, Frequency (Times/Week): 5, Weekly Exercise (Minutes/Week): 150, Intensity: Moderate, Exercise limited by: None identified   Goals Addressed             This Visit's Progress    My goal is to stay independent, active and socially active in  [place].  Also I do not want to fall, so I am very careful with using my handrails when going up or down on the stairs.        Depression Screen    07/25/2021   11:45 AM 07/23/2020   12:57 PM 04/07/2020   10:27 AM 01/13/2019   11:44 AM 08/15/2016    4:44 PM 05/05/2015   10:01 AM 05/04/2014    2:47 PM  PHQ 2/9 Scores  PHQ - 2 Score 0 0 0 0 0 0 0  PHQ- 9 Score     0      Fall Risk    07/25/2021   11:52 AM 07/23/2020   12:57 PM 04/07/2020   10:27 AM 08/15/2016    3:20 PM 05/05/2015   10:01 AM  Fall Risk   Falls in the past year? 0 0 0 No No  Number falls in past yr: 0 0 0    Injury with Fall? 0 0 0    Risk for fall due to : No Fall Risks No Fall Risks No Fall Risks    Follow up Falls evaluation completed Falls evaluation completed       FALL RISK PREVENTION PERTAINING TO THE HOME:  Any stairs in or around the home? Yes  If so, are there any without handrails? No  Home free of loose throw rugs in walkways, pet beds, electrical cords, etc? Yes  Adequate lighting in your home to reduce risk of falls? Yes   ASSISTIVE DEVICES UTILIZED TO PREVENT FALLS:  Life alert? No  Use of a cane, walker or w/c? No  Grab bars in the bathroom? Yes  Shower chair or bench in shower? Yes Elevated toilet seat or a handicapped toilet? No   TIMED UP AND GO:  Was the test performed? Yes .  Length of time to ambulate 10 feet: 6 sec.   Gait steady and fast without use of assistive device  Cognitive Function:        07/25/2021   11:54 AM  6CIT Screen  What Year? 0  points  What month? 0 points  What time? 0 points  Count back from 20 0 points  Months in reverse 0 points  Repeat phrase 0 points  Total Score 0 points    Immunizations Immunization History  Administered Date(s) Administered   Fluad Quad(high Dose 65+) 11/19/2018, 12/16/2019, 12/14/2020   Influenza Whole 11/26/2007   Influenza, High Dose Seasonal PF 12/06/2017   Influenza,inj,Quad PF,6+ Mos 12/06/2016   Moderna SARS-COV2  Booster Vaccination 01/02/2020   Moderna Sars-Covid-2 Vaccination 03/21/2019, 04/18/2019   PNEUMOCOCCAL CONJUGATE-20 09/15/2020   Pneumococcal Conjugate-13 10/08/2013   Pneumococcal Polysaccharide-23 11/24/2008   Zoster, Live 11/10/2010    TDAP status: Up to date  Flu Vaccine status: Up to date  Pneumococcal vaccine status: Up to date  Covid-19 vaccine status: Completed vaccines  Qualifies for Shingles Vaccine? Yes   Zostavax completed Yes   Shingrix Completed?: No.    Education has been provided regarding the importance of this vaccine. Patient has been advised to call insurance company to determine out of pocket expense if they have not yet received this vaccine. Advised may also receive vaccine at local pharmacy or Health Dept. Verbalized acceptance and understanding.  Screening Tests Health Maintenance  Topic Date Due   TETANUS/TDAP  Never done   Zoster Vaccines- Shingrix (1 of 2) Never done   DEXA SCAN  Never done   COVID-19 Vaccine (3 - Moderna risk series) 01/30/2020   INFLUENZA VACCINE  10/04/2021   Pneumonia Vaccine 19+ Years old  Completed   HPV VACCINES  Aged Out    Health Maintenance  Health Maintenance Due  Topic Date Due   TETANUS/TDAP  Never done   Zoster Vaccines- Shingrix (1 of 2) Never done   DEXA SCAN  Never done   COVID-19 Vaccine (3 - Moderna risk series) 01/30/2020    Colorectal cancer screening: No longer required.   Mammogram status: Completed 02/09/2021. Repeat every year  Bone Density Status: no record  Lung Cancer Screening: (Low Dose CT Chest recommended if Age 98-80 years, 30 pack-year currently smoking OR have quit w/in 15years.) does not qualify.   Lung Cancer Screening Referral: no  Additional Screening:  Hepatitis C Screening: does not qualify; Completed no  Vision Screening: Recommended annual ophthalmology exams for early detection of glaucoma and other disorders of the eye. Is the patient up to date with their annual eye  exam?  Yes  Who is the provider or what is the name of the office in which the patient attends annual eye exams? Darleen Crocker, MD. If pt is not established with a provider, would they like to be referred to a provider to establish care? No .   Dental Screening: Recommended annual dental exams for proper oral hygiene  Community Resource Referral / Chronic Care Management: CRR required this visit?  No   CCM required this visit?  No      Plan:     I have personally reviewed and noted the following in the patient's chart:   Medical and social history Use of alcohol, tobacco or illicit drugs  Current medications and supplements including opioid prescriptions.  Functional ability and status Nutritional status Physical activity Advanced directives List of other physicians Hospitalizations, surgeries, and ER visits in previous 12 months Vitals Screenings to include cognitive, depression, and falls Referrals and appointments  In addition, I have reviewed and discussed with patient certain preventive protocols, quality metrics, and best practice recommendations. A written personalized care plan for preventive services as well as general  preventive health recommendations were provided to patient.     Sheral Flow, LPN   4/32/[id number]   Nurse Notes:  Hearing Screening - Comments:: Patient has hearing difficulty and wears hearing aids. Vision Screening - Comments:: Patient does wear corrective lenses/contacts.  Eye exam done by: Cleon Gustin, OD.    Medical screening examination/treatment/procedure(s) were performed by non-physician practitioner and as supervising physician I was immediately available for consultation/collaboration.  I agree with above. Lew Dawes, MD

## 2021-07-26 DIAGNOSIS — R911 Solitary pulmonary nodule: Secondary | ICD-10-CM | POA: Diagnosis not present

## 2021-07-26 DIAGNOSIS — R918 Other nonspecific abnormal finding of lung field: Secondary | ICD-10-CM | POA: Diagnosis not present

## 2021-07-26 DIAGNOSIS — I700 Atherosclerosis of aorta: Secondary | ICD-10-CM | POA: Diagnosis not present

## 2021-08-20 ENCOUNTER — Other Ambulatory Visit: Payer: Self-pay | Admitting: Internal Medicine

## 2021-08-26 ENCOUNTER — Other Ambulatory Visit: Payer: Self-pay | Admitting: Internal Medicine

## 2021-11-04 ENCOUNTER — Other Ambulatory Visit: Payer: Self-pay | Admitting: Internal Medicine

## 2021-11-21 ENCOUNTER — Other Ambulatory Visit: Payer: Self-pay | Admitting: Internal Medicine

## 2022-01-11 ENCOUNTER — Ambulatory Visit (INDEPENDENT_AMBULATORY_CARE_PROVIDER_SITE_OTHER): Payer: Medicare Other | Admitting: Internal Medicine

## 2022-01-11 ENCOUNTER — Encounter: Payer: Self-pay | Admitting: Internal Medicine

## 2022-01-11 VITALS — BP 130/68 | HR 68 | Temp 98.4°F | Ht 65.0 in | Wt 151.0 lb

## 2022-01-11 DIAGNOSIS — Z23 Encounter for immunization: Secondary | ICD-10-CM | POA: Diagnosis not present

## 2022-01-11 DIAGNOSIS — I2510 Atherosclerotic heart disease of native coronary artery without angina pectoris: Secondary | ICD-10-CM

## 2022-01-11 DIAGNOSIS — E890 Postprocedural hypothyroidism: Secondary | ICD-10-CM | POA: Diagnosis not present

## 2022-01-11 DIAGNOSIS — E785 Hyperlipidemia, unspecified: Secondary | ICD-10-CM

## 2022-01-11 DIAGNOSIS — E538 Deficiency of other specified B group vitamins: Secondary | ICD-10-CM | POA: Diagnosis not present

## 2022-01-11 DIAGNOSIS — F411 Generalized anxiety disorder: Secondary | ICD-10-CM

## 2022-01-11 DIAGNOSIS — I2583 Coronary atherosclerosis due to lipid rich plaque: Secondary | ICD-10-CM | POA: Diagnosis not present

## 2022-01-11 DIAGNOSIS — I10 Essential (primary) hypertension: Secondary | ICD-10-CM | POA: Diagnosis not present

## 2022-01-11 DIAGNOSIS — I700 Atherosclerosis of aorta: Secondary | ICD-10-CM

## 2022-01-11 DIAGNOSIS — F419 Anxiety disorder, unspecified: Secondary | ICD-10-CM

## 2022-01-11 MED ORDER — ALPRAZOLAM 0.25 MG PO TABS: 0.2500 mg | ORAL_TABLET | Freq: Two times a day (BID) | ORAL | 2 refills | Status: DC | PRN

## 2022-01-11 NOTE — Assessment & Plan Note (Signed)
On B12 

## 2022-01-11 NOTE — Assessment & Plan Note (Signed)
Cont on Prozac

## 2022-01-11 NOTE — Assessment & Plan Note (Signed)
Chest CT 2021: Coronary, aortic arch, and branch vessel atherosclerotic vascular disease. On Red rice yeast

## 2022-01-11 NOTE — Progress Notes (Signed)
Subjective:  Patient ID: [name], female    DOB: 01/08/1941  Age: 81 y.o. MRN: [id number]  CC: Medication Problem (Want to discuss uping dosage on prozac. Pt states lately she is feeling blue)   HPI REMMI ARMENTEROS presents for sadness, anxiety, B12 def, dyslipidemia  Pt stopped Fluoxetine due to camps in the legs, cramps stopped. It turns out she is still on Fluoxetine.   Outpatient Medications Prior to Visit  Medication Sig Dispense Refill   aspirin 81 MG chewable tablet Chew 81 mg by mouth daily.     atenolol (TENORMIN) 25 MG tablet TAKE 1 TABLET BY MOUTH EVERY DAY 90 tablet 3   cholecalciferol (VITAMIN D) 1000 UNITS tablet Take 1,000 Units by mouth daily.     cyanocobalamin (,VITAMIN B-12,) 1000 MCG/ML injection Give 1000 mcg of Vitamin B12 sq once every 2 weeks 10 mL 5   DENTA 5000 PLUS 1.1 % CREA dental cream      estradiol (ESTRACE) 0.1 MG/GM vaginal cream Place 1 Applicatorful vaginally at bedtime.     FLUoxetine (PROZAC) 20 MG capsule TAKE 1 CAPSULE BY MOUTH EVERY DAY 90 capsule 3   fluticasone (FLONASE) 50 MCG/ACT nasal spray Place into both nostrils daily.     nitrofurantoin, macrocrystal-monohydrate, (MACROBID) 100 MG capsule Take 100 mg by mouth at bedtime. Take 1 by mouth at bedtime     Omega-3 Fatty Acids (FISH OIL) 1000 MG CAPS 1 po bid 100 capsule 3   omeprazole (PRILOSEC OTC) 20 MG tablet Take 20 mg by mouth daily as needed.     SYNTHROID 100 MCG tablet TAKE 1 TABLET BY MOUTH EVERY DAY BEFORE BREAKFAST 90 tablet 0   SYRINGE-NEEDLE, DISP, 3 ML (BD ECLIPSE SYRINGE) 25G X 1" 3 ML MISC As directed for B12 inj 50 each 3   Turmeric (QC TUMERIC COMPLEX PO) Take 475 mg by mouth daily.     escitalopram (LEXAPRO) 20 MG tablet Take 20 mg by mouth daily.     carvedilol (COREG) 6.25 MG tablet Take 6.25 mg by mouth 2 (two) times daily with a meal. (Patient not taking: Reported on 06/14/2021)     No facility-administered medications prior to visit.    ROS: Review of Systems   Constitutional:  Negative for activity change, appetite change, chills, fatigue and unexpected weight change.  HENT:  Negative for congestion, mouth sores and sinus pressure.   Eyes:  Negative for visual disturbance.  Respiratory:  Negative for cough and chest tightness.   Gastrointestinal:  Negative for abdominal pain and nausea.  Genitourinary:  Negative for difficulty urinating, frequency and vaginal pain.  Musculoskeletal:  Negative for back pain and gait problem.  Skin:  Negative for pallor and rash.  Neurological:  Negative for dizziness, tremors, weakness, numbness and headaches.  Psychiatric/Behavioral:  Negative for confusion and sleep disturbance.     Objective:  BP 130/68 (BP Location: Left Arm)   Pulse 68   Temp 98.4 F (36.9 C) (Oral)   Ht '5\' 5"'$  (1.651 m)   Wt 151 lb (68.5 kg)   SpO2 97%   BMI 25.13 kg/m   BP Readings from Last 3 Encounters:  01/11/22 130/68  07/25/21 120/60  06/14/21 118/68    Wt Readings from Last 3 Encounters:  01/11/22 151 lb (68.5 kg)  07/25/21 152 lb 3.2 oz (69 kg)  06/14/21 153 lb (69.4 kg)    Physical Exam Constitutional:      General: She is not in acute distress.  Appearance: She is well-developed.  HENT:     Head: Normocephalic.     Right Ear: External ear normal.     Left Ear: External ear normal.     Nose: Nose normal.  Eyes:     General:        Right eye: No discharge.        Left eye: No discharge.     Conjunctiva/sclera: Conjunctivae normal.     Pupils: Pupils are equal, round, and reactive to light.  Neck:     Thyroid: No thyromegaly.     Vascular: No JVD.     Trachea: No tracheal deviation.  Cardiovascular:     Rate and Rhythm: Normal rate and regular rhythm.     Heart sounds: Normal heart sounds.  Pulmonary:     Effort: No respiratory distress.     Breath sounds: No stridor. No wheezing.  Abdominal:     General: Bowel sounds are normal. There is no distension.     Palpations: Abdomen is soft. There is  no mass.     Tenderness: There is no abdominal tenderness. There is no guarding or rebound.  Musculoskeletal:        General: No tenderness.     Cervical back: Normal range of motion and neck supple. No rigidity.  Lymphadenopathy:     Cervical: No cervical adenopathy.  Skin:    Findings: No erythema or rash.  Neurological:     Cranial Nerves: No cranial nerve deficit.     Motor: No abnormal muscle tone.     Coordination: Coordination normal.     Deep Tendon Reflexes: Reflexes normal.  Psychiatric:        Behavior: Behavior normal.        Thought Content: Thought content normal.        Judgment: Judgment normal.     Lab Results  Component Value Date   WBC 6.2 06/14/2021   HGB 13.4 06/14/2021   HCT 39.5 06/14/2021   PLT 167.0 06/14/2021   GLUCOSE 93 06/14/2021   CHOL 208 (H) 06/14/2021   TRIG 242.0 (H) 06/14/2021   HDL 43.20 06/14/2021   LDLDIRECT 137.0 06/14/2021   LDLCALC 93 03/30/2020   ALT 14 06/14/2021   AST 20 06/14/2021   NA 139 06/14/2021   K 4.3 06/14/2021   CL 103 06/14/2021   CREATININE 0.82 06/14/2021   BUN 12 06/14/2021   CO2 27 06/14/2021   TSH 0.45 06/14/2021   HGBA1C 5.7 09/15/2020    CT Chest Wo Contrast  Result Date: 02/03/2020 CLINICAL DATA:  Lung nodule follow-up. EXAM: CT CHEST WITHOUT CONTRAST TECHNIQUE: Multidetector CT imaging of the chest was performed following the standard protocol without IV contrast. COMPARISON:  CT chest dated June 27, 2019. FINDINGS: Cardiovascular: No significant vascular findings. Normal heart size. No pericardial effusion. No thoracic aortic aneurysm. Coronary, aortic arch, and branch vessel atherosclerotic vascular disease. Mediastinum/Nodes: No enlarged mediastinal or axillary lymph nodes. Thyroid gland, trachea, and esophagus demonstrate no significant findings. Lungs/Pleura: Unchanged slightly irregular 7 mm nodule in the superior segment of the right lower lobe (series 3, image 76), stable since October 2020. 4 mm  ground-glass nodule in the right upper lobe (series 3, image 39) is also unchanged since the prior study. No new pulmonary nodule. No focal consolidation, pleural effusion, or pneumothorax. Unchanged mild biapical pleuroparenchymal scarring. Upper Abdomen: No acute abnormality. Musculoskeletal: No acute or significant osseous findings. Unchanged small mixed sclerotic and lucent lesion in the sternal body, stable  since October 2020, benign. IMPRESSION: 1. Unchanged 7 mm nodule in the superior segment of the right lower lobe. No further follow-up required. 2. Additional 4 mm ground-glass nodule in the right upper lobe is also unchanged since October 2020. No further follow-up recommended. 3. Aortic Atherosclerosis (ICD10-I70.0). Electronically Signed   By: Titus Dubin M.D.   On: 02/03/2020 16:59    Assessment & Plan:   Problem List Items Addressed This Visit     Dyslipidemia - Primary    Chest CT 2021: Coronary, aortic arch, and branch vessel atherosclerotic vascular disease. On Red rice yeast  Check lipids      Relevant Orders   Comprehensive metabolic panel   Lipid panel   TSH   Essential hypertension    Cont w/Atenolol Rx      Hypothyroidism    Managed by Dr Cruzita Lederer      Generalized anxiety disorder    Cont on Prozac       Relevant Medications   ALPRAZolam (XANAX) 0.25 MG tablet   Coronary atherosclerosis    Chest CT 2021: Coronary, aortic arch, and branch vessel atherosclerotic vascular disease. On Red rice yeast      Relevant Orders   Comprehensive metabolic panel   Lipid panel   TSH   B12 deficiency    On B12      Relevant Orders   Comprehensive metabolic panel   Aortic atherosclerosis ([place])    Chest CT 2021: Coronary, aortic arch, and branch vessel atherosclerotic vascular disease. On Red rice yeast      Anxiety    Worse Start Xanax low dose  Potential benefits of a long term benzodiazepines  use as well as potential risks  and complications were  explained to the patient and were aknowledged.      Relevant Medications   ALPRAZolam (XANAX) 0.25 MG tablet   Other Visit Diagnoses     Needs flu shot       Relevant Orders   Flu Vaccine QUAD High Dose(Fluad) (Completed)         Meds ordered this encounter  Medications   ALPRAZolam (XANAX) 0.25 MG tablet    Sig: Take 1 tablet (0.25 mg total) by mouth 2 (two) times daily as needed for anxiety.    Dispense:  60 tablet    Refill:  2      Follow-up: Return in about 3 months (around 04/13/2022) for a follow-up visit.  Walker Kehr, MD

## 2022-01-11 NOTE — Assessment & Plan Note (Signed)
Cont w/Atenolol Rx

## 2022-01-11 NOTE — Assessment & Plan Note (Signed)
Managed by Dr Cruzita Lederer

## 2022-01-11 NOTE — Assessment & Plan Note (Signed)
Worse Start Xanax low dose  Potential benefits of a long term benzodiazepines  use as well as potential risks  and complications were explained to the patient and were aknowledged.

## 2022-01-11 NOTE — Assessment & Plan Note (Signed)
Chest CT 2021: Coronary, aortic arch, and branch vessel atherosclerotic vascular disease. On Red rice yeast  Check lipids

## 2022-02-20 ENCOUNTER — Other Ambulatory Visit (INDEPENDENT_AMBULATORY_CARE_PROVIDER_SITE_OTHER): Payer: Medicare Other

## 2022-02-20 DIAGNOSIS — E538 Deficiency of other specified B group vitamins: Secondary | ICD-10-CM | POA: Diagnosis not present

## 2022-02-20 DIAGNOSIS — I2510 Atherosclerotic heart disease of native coronary artery without angina pectoris: Secondary | ICD-10-CM

## 2022-02-20 DIAGNOSIS — E785 Hyperlipidemia, unspecified: Secondary | ICD-10-CM | POA: Diagnosis not present

## 2022-02-20 DIAGNOSIS — I2583 Coronary atherosclerosis due to lipid rich plaque: Secondary | ICD-10-CM

## 2022-02-20 LAB — COMPREHENSIVE METABOLIC PANEL
ALT: 13 U/L (ref 0–35)
AST: 18 U/L (ref 0–37)
Albumin: 4.2 g/dL (ref 3.5–5.2)
Alkaline Phosphatase: 46 U/L (ref 39–117)
BUN: 11 mg/dL (ref 6–23)
CO2: 25 mEq/L (ref 19–32)
Calcium: 8.8 mg/dL (ref 8.4–10.5)
Chloride: 104 mEq/L (ref 96–112)
Creatinine, Ser: 0.78 mg/dL (ref 0.40–1.20)
GFR: 71.31 mL/min (ref >60.00)
Glucose, Bld: 93 mg/dL (ref 70–99)
Potassium: 4.2 mEq/L (ref 3.5–5.1)
Sodium: 139 mEq/L (ref 135–145)
Total Bilirubin: 0.5 mg/dL (ref 0.2–1.2)
Total Protein: 6.8 g/dL (ref 6.0–8.3)

## 2022-02-20 LAB — LIPID PANEL
Cholesterol: 178 mg/dL (ref 0–200)
HDL: 42.8 mg/dL (ref >39.00)
LDL Cholesterol: 98 mg/dL (ref 0–99)
NonHDL: 135.43
Total CHOL/HDL Ratio: 4
Triglycerides: 188 mg/dL — ABNORMAL HIGH (ref 0.0–149.0)
VLDL: 37.6 mg/dL (ref 0.0–40.0)

## 2022-02-20 LAB — TSH: TSH: 0.16 u[IU]/mL — ABNORMAL LOW (ref 0.35–5.50)

## 2022-02-22 ENCOUNTER — Other Ambulatory Visit: Payer: Self-pay | Admitting: Internal Medicine

## 2022-03-03 ENCOUNTER — Ambulatory Visit: Payer: Medicare Other | Admitting: Internal Medicine

## 2022-03-03 ENCOUNTER — Encounter: Payer: Self-pay | Admitting: Internal Medicine

## 2022-03-03 VITALS — BP 138/72 | HR 67 | Ht 65.0 in | Wt 153.8 lb

## 2022-03-03 DIAGNOSIS — Z85850 Personal history of malignant neoplasm of thyroid: Secondary | ICD-10-CM

## 2022-03-03 DIAGNOSIS — E890 Postprocedural hypothyroidism: Secondary | ICD-10-CM | POA: Diagnosis not present

## 2022-03-03 MED ORDER — SYNTHROID 88 MCG PO TABS: 88.0000 ug | ORAL_TABLET | Freq: Every day | ORAL | 6 refills | Status: DC

## 2022-03-03 NOTE — Progress Notes (Signed)
Patient ID: Lauren Mcgee, female   DOB: [birth year], 81 y.o.   MRN: [id number]  HPI  Lauren Mcgee is a 81 y.o.-year-old female, returning for f/u for follicular and papillary thyroid cancer, in remission, and postsurgical hypothyroidism. Last visit 1 year ago.  Interim history: She feels well, w/o complaints today. She had a UTI - on Nitrofurantoin. Today, she is late ~40 minutes after she initially went to another building and then got lost.  She is driving from [place] to come here.  Reviewed and addended history: Thyroid cancer  - dx'ed in 2010.  Thyroidectomy  on 12/07/2009 (Dr Fredirick Maudlin) for a thyroid tumor >> ThyCA: Minimally invasive follicular carcinoma of the right thyroid lobe, measuring 2.7 cm, and multifocal papillary microcarcinoma bilaterally. No nodes sampled. One of the papillary tumors was approaching and may have involved the surgical resection margin. There was minimal extra thyroid though invasion of one of the papillary microcarcinoma's, but no lymphovascular invasion.  RAI tx with 154 mCi I-131 on 03/16/2010. At that time TSH was >100, thyroglobulin was 0.7, ATA<20  Post treatment WBS did not show metastatic disease. There were foci of activity which were previously seen on the pretherapy scan. No new lesions identified.  Neck U/S 12/09/2012: Post thyroidectomy. No evidence of residual thyroid tissue or mass/nodule at the thyroid bed.  CT chest 06/27/2019: Lungs/Pleura: Again noted is a slightly irregular nodule in the superior segment of the right lower lobe with irregular margins. It appears essentially unchanged in size measuring approximately 6.7 mm maximum diameter. There is new posterior pleural thickening in the right hemithorax best seen on image 59 of series 6. There is a new faint ground-glass 4 mm nodule in the right upper lobe seen on image 58 of series 6 and image 33 of series 3. The lungs are otherwise clear.  CT chest 02/03/2020:  1. Unchanged 7  mm nodule in the superior segment of the right lower lobe. No further follow-up required. 2. Additional 4 mm ground-glass nodule in the right upper lobe is also unchanged since October 2020. No further follow-up recommended.  Reviewed her thyroglobulin and ATA levels: Lab Results  Component Value Date   THYROGLB <0.1 (L) 03/03/2021   THYROGLB 0.1 (L) 12/16/2019   THYROGLB <0.1 (L) 01/23/2019   THYROGLB 0.1 (L) 01/11/2018   THYROGLB 0.1 (L) 12/06/2016   THYROGLB <0.1 (L) 10/12/2015   THYROGLB <0.1 (L) 10/12/2014   THYROGLB <0.2 06/24/2013   THYROGLB <0.2 12/02/2012   THGAB <1 03/03/2021   THGAB <1 12/16/2019   THGAB <1 01/23/2019   THGAB <1 01/11/2018   THGAB <1 12/06/2016   THGAB <1 10/12/2015   THGAB <1 10/12/2014   THGAB <20.0 06/24/2013   THGAB <20.0 12/02/2012   Postsurgical hypothyroidism:  She is on Synthroid 100 mcg (increased from 88 mcg 02/2021): - in am - fasting - 30 min  from b'fast - no Ca, Fe, MVI - off and on PPIs - later in the day - not on Biotin  Reviewed her TFTs: Lab Results  Component Value Date   TSH 0.16 (L) 02/20/2022   TSH 0.45 06/14/2021   TSH 1.26 04/11/2021   TSH 7.97 (H) 03/03/2021   TSH 5.62 (H) 09/15/2020   TSH 11.76 (H) 03/30/2020   TSH 5.42 (H) 12/16/2019   TSH 1.83 06/12/2019   TSH 0.57 04/15/2019   TSH 0.30 (L) 03/12/2019   FREET4 1.09 04/11/2021   FREET4 1.01 03/03/2021   FREET4 0.91 04/01/2020   FREET4  0.70 12/16/2019   FREET4 0.75 06/12/2019   FREET4 0.94 04/15/2019   FREET4 1.08 03/12/2019   FREET4 1.09 01/23/2019   FREET4 1.05 12/06/2018   FREET4 1.14 04/03/2018   Pt denies: - feeling nodules in neck - hoarseness - choking - SOB with lying down But does have chronic dysphagia, which started before her surgery.  She continues atenolol per cardiology.  She also has a history of HTN, HL (Crestor >> fatigue), PVD, GERD.   ROS: + Continues to have subjective hyperthermia + see HPI  I reviewed pt's  medications, allergies, PMH, social hx, family hx, and changes were documented in the history of present illness. Otherwise, unchanged from my initial visit note.  Past Medical History:  Diagnosis Date   Diverticulosis of colon (without mention of hemorrhage)    GERD (gastroesophageal reflux disease)    Hx of colonic polyp 2001   TUBULAR ADENOMA   Hyperlipidemia    Hypertension    Hypothyroidism    IBS (irritable bowel syndrome)    PVD (peripheral vascular disease) ([place])    Thyroid cancer ([place]) 2011   Past Surgical History:  Procedure Laterality Date   ABDOMINAL HYSTERECTOMY  1991   COLONOSCOPY  2010   562.10   ESOPHAGOGASTRODUODENOSCOPY     multiple   THYROIDECTOMY  2010   and 131I treatment at [facility]- Dr. Celine Ahr   Social History   Socioeconomic History   Marital status: Married    Spouse name: Not on file   Number of children: 2   Years of education: Not on file   Highest education level: Not on file  Occupational History   Occupation: retired  Tobacco Use   Smoking status: Former    Packs/day: 0.50    Types: Cigarettes    Quit date: 03/06/1958    Years since quitting: 64.0   Smokeless tobacco: Never  Vaping Use   Vaping Use: Never used  Substance and Sexual Activity   Alcohol use: No   Drug use: No   Sexual activity: Yes  Other Topics Concern   Not on file  Social History Narrative   Retired   Married 2 kids   Regular exercise: not outside the home    Caffeine use: coffee daily   Former smoker, no alcohol, no drugs   Social Determinants of Radio broadcast assistant Strain: Low Risk  (07/25/2021)   Overall Financial Resource Strain (CARDIA)    Difficulty of Paying Living Expenses: Not hard at all  Food Insecurity: No Food Insecurity (07/25/2021)   Hunger Vital Sign    Worried About Running Out of Food in the Last Year: Never true    [place] in the Last Year: Never true  Transportation Needs: No Transportation Needs (07/25/2021)   PRAPARE -  Hydrologist (Medical): No    Lack of Transportation (Non-Medical): No  Physical Activity: Sufficiently Active (07/25/2021)   Exercise Vital Sign    Days of Exercise per Week: 5 days    Minutes of Exercise per Session: 30 min  Stress: No Stress Concern Present (07/25/2021)   [place]    Feeling of Stress : Not at all  Social Connections: [place] (07/25/2021)   Social Connection and Isolation Panel [NHANES]    Frequency of Communication with Friends and Family: More than three times a week    Frequency of Social Gatherings with Friends and Family: Twice a week  Attends Religious Services: More than 4 times per year    Active Member of Clubs or Organizations: Yes    Attends Archivist Meetings: More than 4 times per year    Marital Status: Married  Human resources officer Violence: Not At Risk (07/25/2021)   Humiliation, Afraid, Rape, and Kick questionnaire    Fear of Current or Ex-Partner: No    Emotionally Abused: No    Physically Abused: No    Sexually Abused: No   Current Outpatient Medications on File Prior to Visit  Medication Sig Dispense Refill   ALPRAZolam (XANAX) 0.25 MG tablet Take 1 tablet (0.25 mg total) by mouth 2 (two) times daily as needed for anxiety. 60 tablet 2   aspirin 81 MG chewable tablet Chew 81 mg by mouth daily.     atenolol (TENORMIN) 25 MG tablet TAKE 1 TABLET BY MOUTH EVERY DAY 90 tablet 3   carvedilol (COREG) 6.25 MG tablet Take 6.25 mg by mouth 2 (two) times daily with a meal. (Patient not taking: Reported on 06/14/2021)     cholecalciferol (VITAMIN D) 1000 UNITS tablet Take 1,000 Units by mouth daily.     cyanocobalamin (VITAMIN B12) 1000 MCG/ML injection GIVE 1000 MCG OF VITAMIN B12 SUBCUTANEOUSLY DAILY X 1 WEEK, THEN WEEKLY FOR 1 MONTH, THEN ONCE EVERY 2 WEEKS 10 mL 5   DENTA 5000 PLUS 1.1 % CREA dental cream      estradiol (ESTRACE) 0.1 MG/GM  vaginal cream Place 1 Applicatorful vaginally at bedtime.     FLUoxetine (PROZAC) 20 MG capsule TAKE 1 CAPSULE BY MOUTH EVERY DAY 90 capsule 3   fluticasone (FLONASE) 50 MCG/ACT nasal spray Place into both nostrils daily.     nitrofurantoin, macrocrystal-monohydrate, (MACROBID) 100 MG capsule Take 100 mg by mouth at bedtime. Take 1 by mouth at bedtime     Omega-3 Fatty Acids (FISH OIL) 1000 MG CAPS 1 po bid 100 capsule 3   omeprazole (PRILOSEC OTC) 20 MG tablet Take 20 mg by mouth daily as needed.     SYNTHROID 100 MCG tablet TAKE 1 TABLET BY MOUTH EVERY DAY BEFORE BREAKFAST 90 tablet 0   SYRINGE-NEEDLE, DISP, 3 ML (BD ECLIPSE SYRINGE) 25G X 1" 3 ML MISC As directed for B12 inj 50 each 3   Turmeric (QC TUMERIC COMPLEX PO) Take 475 mg by mouth daily.     No current facility-administered medications on file prior to visit.   Allergies  Allergen Reactions   Influenza Vaccines     Local reaction   Lexapro [Escitalopram]     Withdrawal sx's   Pravastatin     Cramps, dizziness   Family History  Problem Relation Age of Onset   Hyperlipidemia Mother    Heart disease Mother    Hypertension Other    Colon cancer Paternal Aunt 74   Colon cancer Son 7   Colon polyps Neg Hx    Liver cancer Neg Hx    Pancreatic cancer Neg Hx    Rectal cancer Neg Hx    Stomach cancer Neg Hx    PE: BP 138/72 (BP Location: Right Arm, Patient Position: Sitting, Cuff Size: Normal)   Pulse 67   Ht _0  (1.651 m)   Wt 153 lb 12.8 oz (69.8 kg)   SpO2 99%   BMI 25.59 kg/m   Wt Readings from Last 3 Encounters:  03/03/22 153 lb 12.8 oz (69.8 kg)  01/11/22 151 lb (68.5 kg)  07/25/21 152 lb 3.2 oz (69 kg)  Constitutional: Normal weight, in NAD Eyes:  EOMI, no exophthalmos ENT: no neck masses, no cervical lymphadenopathy Cardiovascular: RRR, No MRG Respiratory: CTA B Musculoskeletal: no deformities Skin:no rashes Neurological: no tremor with outstretched hands  ASSESSMENT: 1. Hypothyroidism  2.  History of thyroid cancer  PLAN:  1. Patient with postsurgical hypothyroidism, on Synthroid d.a.w. - At last visit, TSH was elevated so we had to increase the dose of levothyroxine from 88 to 100 mcg daily - latest thyroid labs reviewed with pt. >> TSH was suppressed Lab Results  Component Value Date   TSH 0.16 (L) 02/20/2022  - she continues on LT4 100 mcg daily >> will decrease the dose back to 88 mcg daily - pt feels good on this dose but continues to have heat intolerance, chronic. - we discussed about taking the thyroid hormone every day, with water, >30 minutes before breakfast, separated by >4 hours from acid reflux medications, calcium, iron, multivitamins. Pt. is taking it correctly.  She is not missing doses. - will check thyroid tests in 5-6 weeks: TSH and fT4.  If the tests are still abnormal we may need to have her alternate between 188 mcg every other day. - If labs are abnormal, she will need to return for repeat TFTs in 1.5 months  2. PTC -In remission -Reviewed records available from Dr. Celine Ahr -At last visit we reviewed her CT scans from 2011.  They appeared stable and no further follow-up was recommended on repeat CT scan from 02/03/2020.  We discussed that these are unlikely to be related to her thyroid cancer. -In the past, her thyroglobulin was fluctuating between 0.1 and undetectable.  We discussed that this is most likely due to assay variability.  Latest level was undetectable.  Her antithyroglobulin antibodies are also undetectable -No neck compression symptoms or masses felt on palpation of her neck today -Will repeat her thyroglobulin and ATA antibodies in 5-6 weeks -No need for further ultrasounds unless thyroglobulin starts to increase. -I will see her back in a year  Orders Placed This Encounter  Procedures   TSH   T4, free   Thyroglobulin antibody   Thyroglobulin Level   Philemon Kingdom, MD PhD [facility]

## 2022-03-03 NOTE — Patient Instructions (Addendum)
Please decrease the dose of Synthroid to 88 mcg daily.  Take the thyroid hormone every day, with water, at least 30 minutes before breakfast, separated by at least 4 hours from: - acid reflux medications - calcium - iron - multivitamins  Please come back for labs in 5-6 weeks.  Please come back for a follow-up appointment in 1 year.

## 2022-03-07 DIAGNOSIS — R051 Acute cough: Secondary | ICD-10-CM | POA: Diagnosis not present

## 2022-03-07 DIAGNOSIS — J069 Acute upper respiratory infection, unspecified: Secondary | ICD-10-CM | POA: Diagnosis not present

## 2022-03-23 NOTE — Progress Notes (Signed)
Subjective:    Patient ID: Lauren Mcgee, female    DOB: [birth year], 82 y.o.   MRN: [id number]      HPI Annmargaret is here for  Chief Complaint  Patient presents with   Cough    Cough x 3 weeks (productive at times)     Cough x 3 weeks - it started 3 weeks ago.  She had bronchitis.  Her symptoms are better.  She has a persisting cough.  The cough is mostly dry, occasionally bringing up some mucus.  Has some mucus stuck in her chest. She has no wheeze, SOB.   She is using flonase.  She takes benadryl at night since the cough started.  She is sleeping okay with the Benadryl.  Medications and allergies reviewed with patient and updated if appropriate.  Current Outpatient Medications on File Prior to Visit  Medication Sig Dispense Refill   ALPRAZolam (XANAX) 0.25 MG tablet Take 1 tablet (0.25 mg total) by mouth 2 (two) times daily as needed for anxiety. 60 tablet 2   aspirin 81 MG chewable tablet Chew 81 mg by mouth daily.     atenolol (TENORMIN) 25 MG tablet TAKE 1 TABLET BY MOUTH EVERY DAY 90 tablet 3   carvedilol (COREG) 6.25 MG tablet Take 6.25 mg by mouth 2 (two) times daily with a meal.     cholecalciferol (VITAMIN D) 1000 UNITS tablet Take 1,000 Units by mouth daily.     cyanocobalamin (VITAMIN B12) 1000 MCG/ML injection GIVE 1000 MCG OF VITAMIN B12 SUBCUTANEOUSLY DAILY X 1 WEEK, THEN WEEKLY FOR 1 MONTH, THEN ONCE EVERY 2 WEEKS 10 mL 5   DENTA 5000 PLUS 1.1 % CREA dental cream      estradiol (ESTRACE) 0.1 MG/GM vaginal cream Place 1 Applicatorful vaginally at bedtime.     FLUoxetine (PROZAC) 20 MG capsule TAKE 1 CAPSULE BY MOUTH EVERY DAY 90 capsule 3   fluticasone (FLONASE) 50 MCG/ACT nasal spray Place into both nostrils daily.     nitrofurantoin, macrocrystal-monohydrate, (MACROBID) 100 MG capsule Take 100 mg by mouth at bedtime. Take 1 by mouth at bedtime     Omega-3 Fatty Acids (FISH OIL) 1000 MG CAPS 1 po bid 100 capsule 3   omeprazole (PRILOSEC OTC) 20 MG tablet Take 20 mg  by mouth daily as needed.     SYNTHROID 88 MCG tablet Take 1 tablet (88 mcg total) by mouth daily before breakfast. 45 tablet 6   SYRINGE-NEEDLE, DISP, 3 ML (BD ECLIPSE SYRINGE) 25G X 1" 3 ML MISC As directed for B12 inj 50 each 3   Turmeric (QC TUMERIC COMPLEX PO) Take 475 mg by mouth daily.     No current facility-administered medications on file prior to visit.    Review of Systems  Constitutional:  Negative for chills and fever.  HENT:  Positive for congestion and postnasal drip. Negative for ear pain, sinus pressure, sinus pain and sore throat.   Respiratory:  Positive for cough. Negative for shortness of breath and wheezing.   Neurological:  Positive for headaches (sometimes - mild). Negative for dizziness and light-headedness.       Objective:   Vitals:   03/24/22 1343  BP: 128/84  Pulse: 70  Temp: 98 F (36.7 C)  SpO2: 97%   BP Readings from Last 3 Encounters:  03/24/22 128/84  03/03/22 138/72  01/11/22 130/68   Wt Readings from Last 3 Encounters:  03/24/22 151 lb (68.5 kg)  03/03/22 153 lb 12.8 oz (69.8  kg)  01/11/22 151 lb (68.5 kg)   Body mass index is 25.13 kg/m.    Physical Exam Constitutional:      General: She is not in acute distress.    Appearance: Normal appearance. She is not ill-appearing.  HENT:     Head: Normocephalic and atraumatic.     Right Ear: Tympanic membrane, ear canal and external ear normal.     Left Ear: Tympanic membrane, ear canal and external ear normal.     Mouth/Throat:     Mouth: Mucous membranes are moist.     Pharynx: No oropharyngeal exudate or posterior oropharyngeal erythema.  Eyes:     Conjunctiva/sclera: Conjunctivae normal.  Cardiovascular:     Rate and Rhythm: Normal rate and regular rhythm.  Pulmonary:     Effort: Pulmonary effort is normal. No respiratory distress.     Breath sounds: Normal breath sounds. No wheezing or rales.  Musculoskeletal:     Cervical back: Neck supple. No tenderness.  Lymphadenopathy:      Cervical: No cervical adenopathy.  Skin:    General: Skin is warm and dry.  Neurological:     Mental Status: She is alert.            Assessment & Plan:    Postviral cough syndrome, postnasal drip: Acute Developed bronchitis 3 weeks ago and has residual cough, postnasal drainage Lungs are clear, no shortness of breath or wheeze No evidence of active infection so antibiotic not necessary Continue Benadryl at night-advised to take short-term only, continue Flonase once daily Can start saline nasal spray, Mucinex Will prescribe prednisone 40 mg daily x 3 days Call or return if no improvement-reassured that this will continue to improve   Hypertension: Chronic Blood pressure is well-controlled Continue atenolol 25 mg daily, carvedilol 6.25 mg twice daily

## 2022-03-24 ENCOUNTER — Ambulatory Visit (INDEPENDENT_AMBULATORY_CARE_PROVIDER_SITE_OTHER): Payer: Medicare Other | Admitting: Internal Medicine

## 2022-03-24 ENCOUNTER — Encounter: Payer: Self-pay | Admitting: Internal Medicine

## 2022-03-24 VITALS — BP 128/84 | HR 70 | Temp 98.0°F | Ht 65.0 in | Wt 151.0 lb

## 2022-03-24 DIAGNOSIS — R0982 Postnasal drip: Secondary | ICD-10-CM | POA: Diagnosis not present

## 2022-03-24 DIAGNOSIS — R058 Other specified cough: Secondary | ICD-10-CM | POA: Diagnosis not present

## 2022-03-24 MED ORDER — PREDNISONE 20 MG PO TABS: 40.0000 mg | ORAL_TABLET | Freq: Every day | ORAL | 0 refills | Status: AC

## 2022-03-24 NOTE — Patient Instructions (Addendum)
Medications changes include :   prednisone 40 mg daily for breakfast x 3 days  you can try mucinex.  Use the flonase nasal spray once a day.  Try saline nasal spray.      Return if symptoms worsen or fail to improve.

## 2022-04-12 ENCOUNTER — Other Ambulatory Visit (INDEPENDENT_AMBULATORY_CARE_PROVIDER_SITE_OTHER): Payer: Medicare Other

## 2022-04-12 DIAGNOSIS — Z85850 Personal history of malignant neoplasm of thyroid: Secondary | ICD-10-CM | POA: Diagnosis not present

## 2022-04-12 DIAGNOSIS — E890 Postprocedural hypothyroidism: Secondary | ICD-10-CM | POA: Diagnosis not present

## 2022-04-12 LAB — TSH: TSH: 0.84 u[IU]/mL (ref 0.35–5.50)

## 2022-04-12 LAB — T4, FREE: Free T4: 1.15 ng/dL (ref 0.60–1.60)

## 2022-04-14 LAB — THYROGLOBULIN LEVEL: Thyroglobulin: <0.1 ng/mL — ABNORMAL LOW

## 2022-04-14 LAB — THYROGLOBULIN ANTIBODY: Thyroglobulin Ab: <1 IU/mL (ref <=1)

## 2022-05-10 IMAGING — CT CT CHEST W/O CM
2 of 4 series · 15 of 36 positions shown, 18 images · non-contrast
Comparison: CT chest dated June 27, 2019.

CLINICAL DATA: Lung nodule follow-up.

EXAM:
CT CHEST WITHOUT CONTRAST
TECHNIQUE: Multidetector CT imaging of the chest was performed following the
standard protocol without IV contrast.

[Series 2: thorax · axial · 0.67mm/px · z∈[-312,-46]mm · 12 of 159 slices shown, 15 images]
[im 13/159  mediastinal]
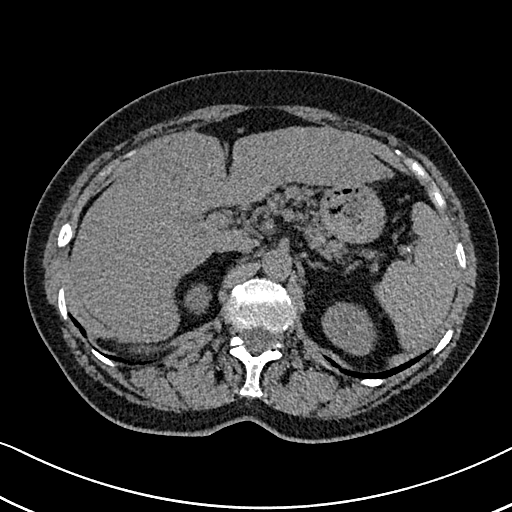
[im 13/159  lung]
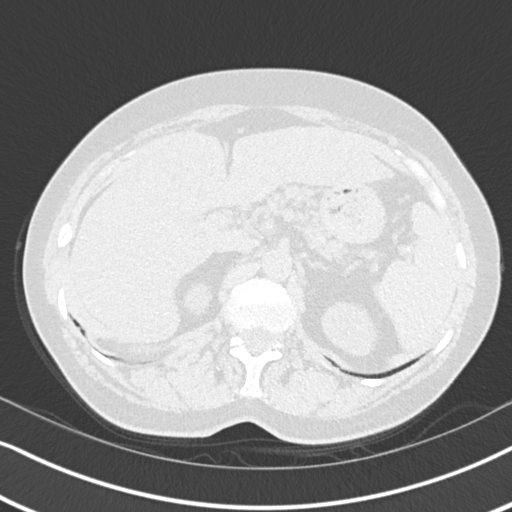
[im 25/159  lung]
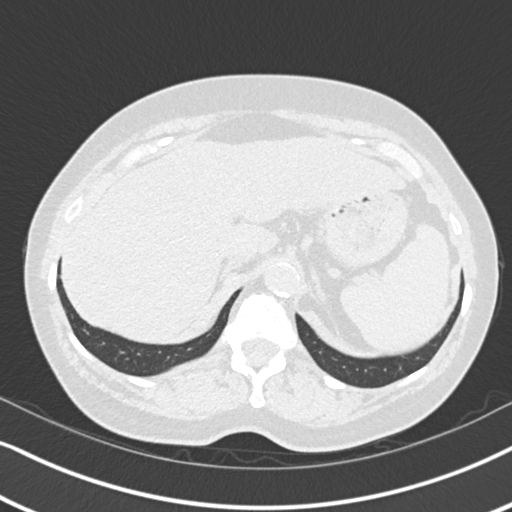
[im 37/159  lung]
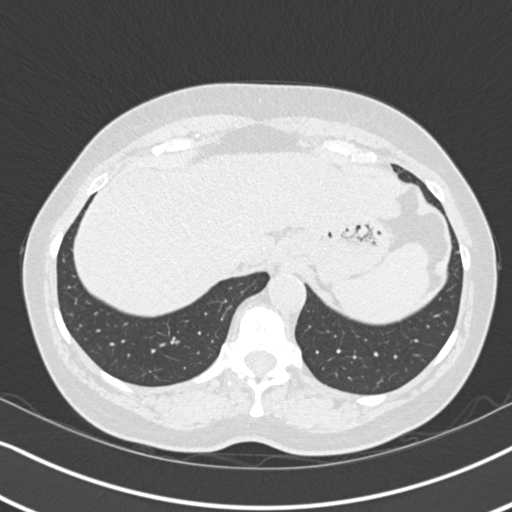
[im 49/159  lung]
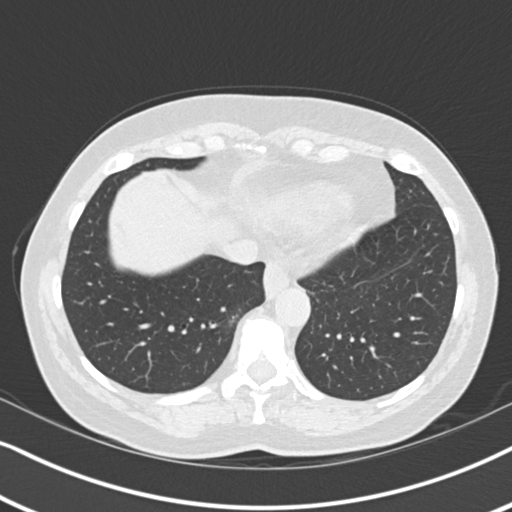
[im 61/159  mediastinal]
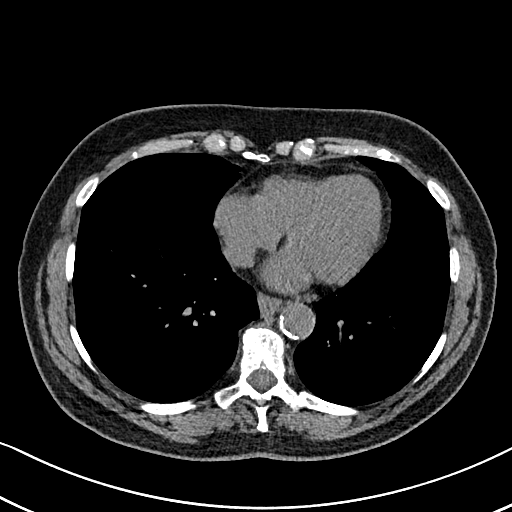
[im 61/159  lung]
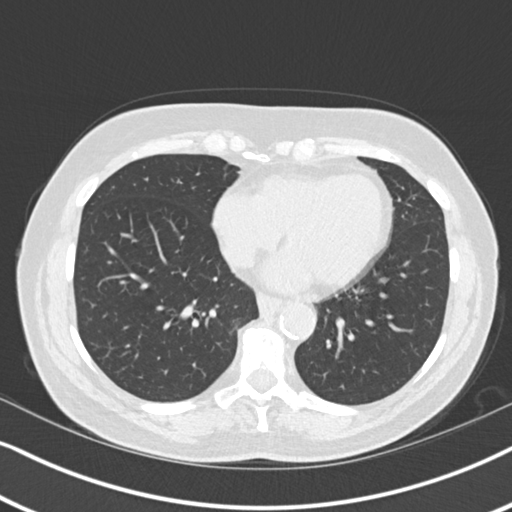
[im 73/159  lung]
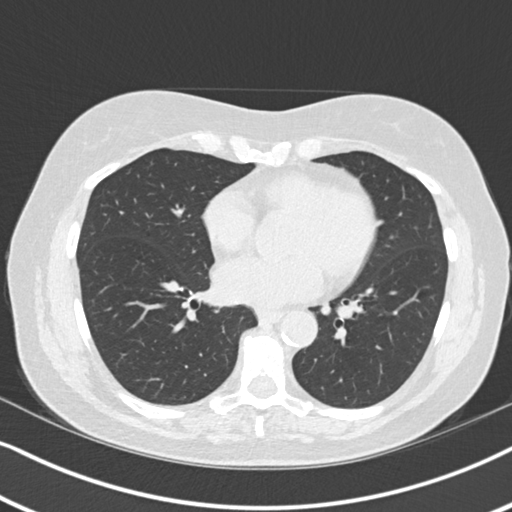
[im 86/159  lung]
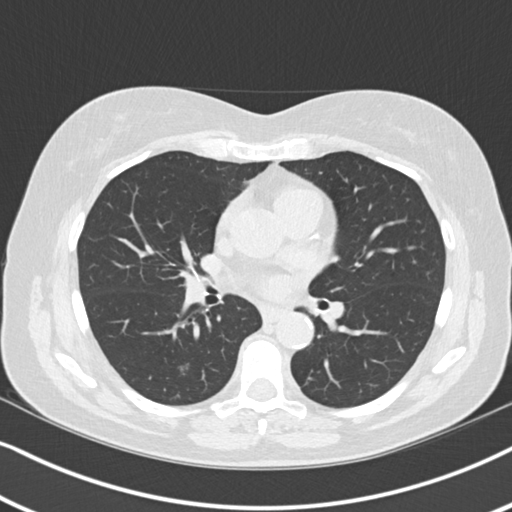
[im 98/159  lung]
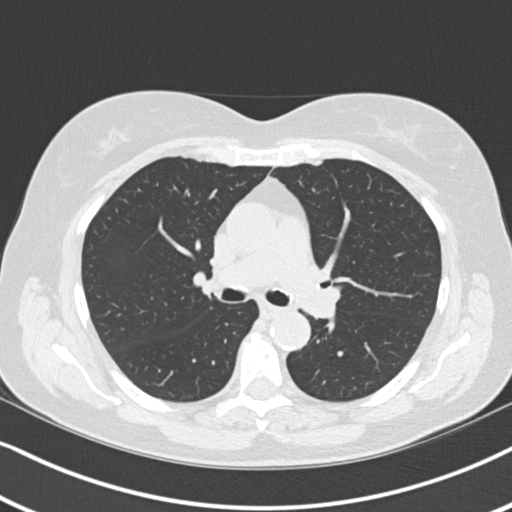
[im 110/159  mediastinal]
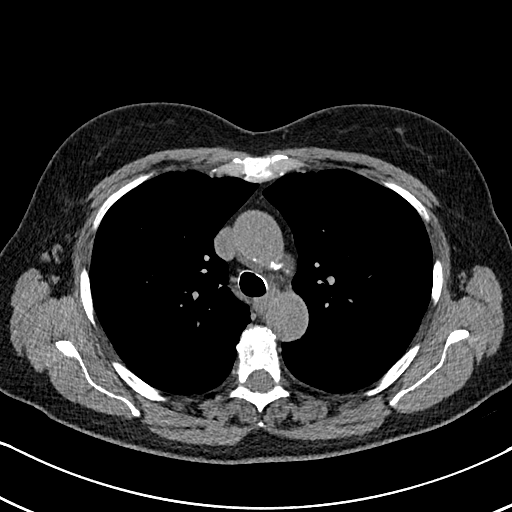
[im 110/159  lung]
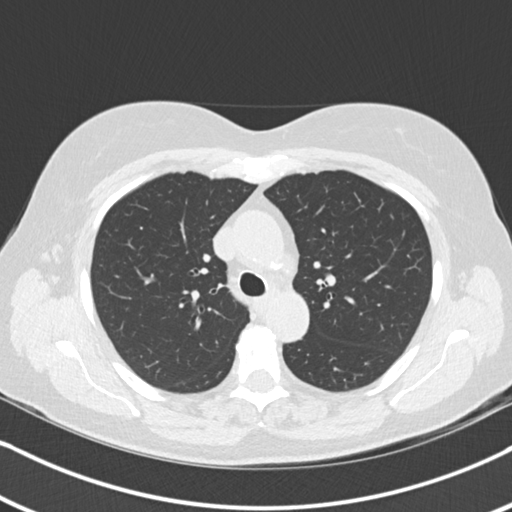
[im 122/159  lung]
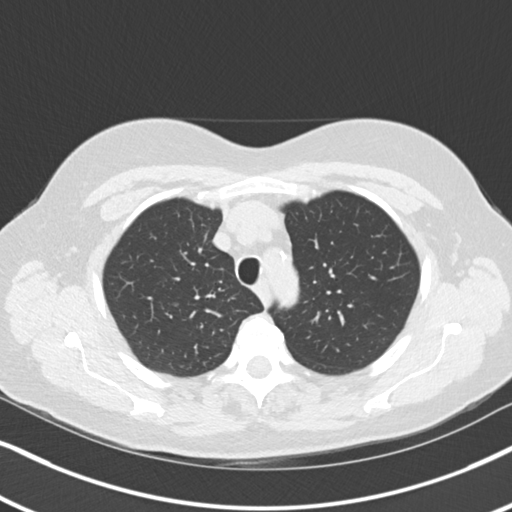
[im 134/159  lung]
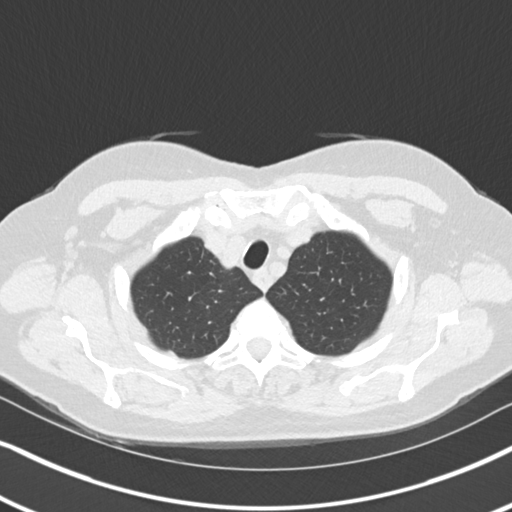
[im 146/159  lung]
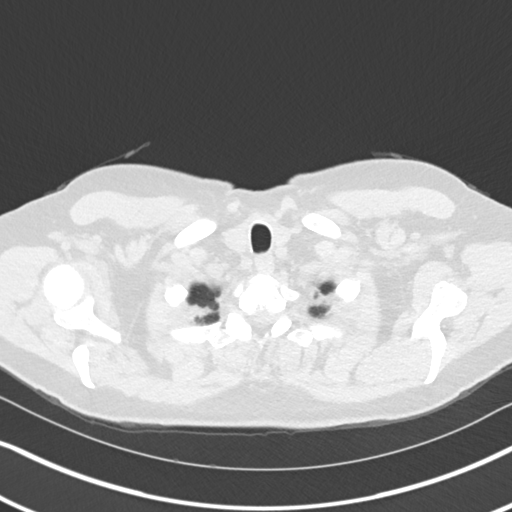

[Series 5: coronal · coronal · 0.62mm/px · 3 of 118 slices shown]
[im 24/118  lung]
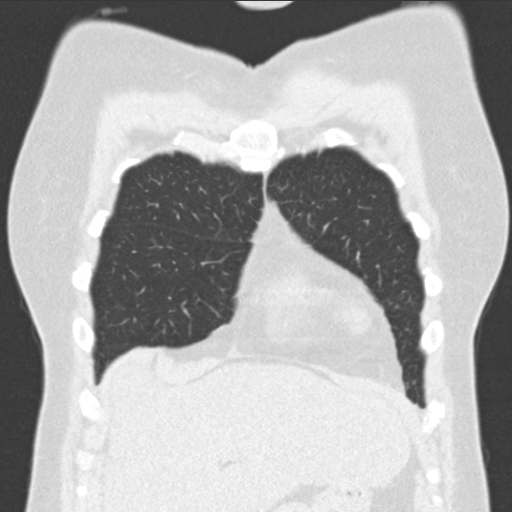
[im 47/118  lung]
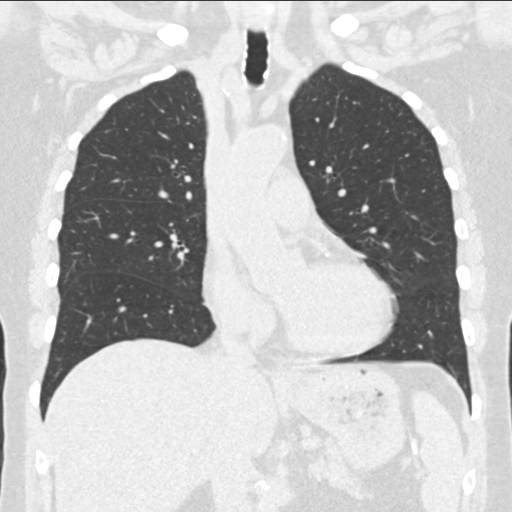
[im 71/118  lung]
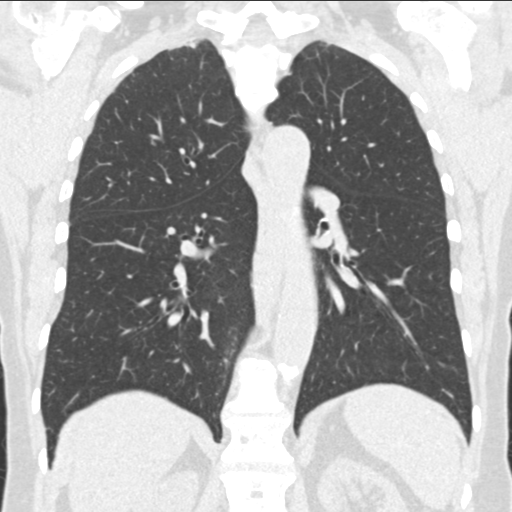

[15 of 36 positions shown; findings below may reference images not displayed]

FINDINGS: Cardiovascular: No significant vascular findings. Normal heart size.
No pericardial effusion. No thoracic aortic aneurysm. Coronary,
aortic arch, and branch vessel atherosclerotic vascular disease.

Mediastinum/Nodes: No enlarged mediastinal or axillary lymph nodes.
Thyroid gland, trachea, and esophagus demonstrate no significant
findings.

Lungs/Pleura: Unchanged slightly irregular 7 mm nodule in the
superior segment of the right lower lobe (series 3, image 76),
stable since December 2018 mm ground-glass nodule in the right
upper lobe (series 3, image 39) is also unchanged since the prior
study. No new pulmonary nodule. No focal consolidation, pleural
effusion, or pneumothorax. Unchanged mild biapical pleuroparenchymal
scarring.

Upper Abdomen: No acute abnormality.

Musculoskeletal: No acute or significant osseous findings. Unchanged
small mixed sclerotic and lucent lesion in the sternal body, stable
since December 2018, benign.
IMPRESSION: 1. Unchanged 7 mm nodule in the superior segment of the right lower
lobe. No further follow-up required.
2. Additional 4 mm ground-glass nodule in the right upper lobe is
also unchanged since December 2018. No further follow-up recommended.
3. Aortic Atherosclerosis ([id number]-SGL.L).

## 2022-06-01 ENCOUNTER — Other Ambulatory Visit: Payer: Self-pay | Admitting: Internal Medicine

## 2022-06-01 DIAGNOSIS — H25813 Combined forms of age-related cataract, bilateral: Secondary | ICD-10-CM | POA: Diagnosis not present

## 2022-06-23 ENCOUNTER — Other Ambulatory Visit: Payer: Self-pay | Admitting: Internal Medicine

## 2022-06-23 ENCOUNTER — Other Ambulatory Visit: Payer: Medicare Other

## 2022-07-03 ENCOUNTER — Encounter: Payer: Self-pay | Admitting: Internal Medicine

## 2022-07-03 ENCOUNTER — Ambulatory Visit (INDEPENDENT_AMBULATORY_CARE_PROVIDER_SITE_OTHER): Payer: Medicare Other | Admitting: Internal Medicine

## 2022-07-03 VITALS — BP 120/72 | HR 68 | Temp 98.6°F | Wt 150.0 lb

## 2022-07-03 DIAGNOSIS — E785 Hyperlipidemia, unspecified: Secondary | ICD-10-CM | POA: Diagnosis not present

## 2022-07-03 DIAGNOSIS — I10 Essential (primary) hypertension: Secondary | ICD-10-CM

## 2022-07-03 DIAGNOSIS — F419 Anxiety disorder, unspecified: Secondary | ICD-10-CM

## 2022-07-03 DIAGNOSIS — E890 Postprocedural hypothyroidism: Secondary | ICD-10-CM

## 2022-07-03 DIAGNOSIS — E538 Deficiency of other specified B group vitamins: Secondary | ICD-10-CM

## 2022-07-03 DIAGNOSIS — R739 Hyperglycemia, unspecified: Secondary | ICD-10-CM

## 2022-07-03 MED ORDER — ALPRAZOLAM 0.25 MG PO TABS: 0.2500 mg | ORAL_TABLET | Freq: Two times a day (BID) | ORAL | 2 refills | Status: DC | PRN

## 2022-07-03 NOTE — Progress Notes (Signed)
Subjective:  Patient ID: Lauren Mcgee, female    DOB: [birth year]  Age: 82 y.o. MRN: [id number]  CC: Follow-up (5 mnth f/u/)   HPI Lauren Mcgee presents for HTN, dyslipidemia - on Red Rice Yeast, insomnia/anxiety  Outpatient Medications Prior to Visit  Medication Sig Dispense Refill   aspirin 81 MG chewable tablet Chew 81 mg by mouth daily.     atenolol (TENORMIN) 25 MG tablet TAKE 1 TABLET BY MOUTH EVERY DAY 90 tablet 3   carvedilol (COREG) 6.25 MG tablet Take 6.25 mg by mouth 2 (two) times daily with a meal.     cholecalciferol (VITAMIN D) 1000 UNITS tablet Take 1,000 Units by mouth daily.     cyanocobalamin (VITAMIN B12) 1000 MCG/ML injection GIVE 1000 MCG OF VITAMIN B12 SUBCUTANEOUSLY DAILY X 1 WEEK, THEN WEEKLY FOR 1 MONTH, THEN ONCE EVERY 2 WEEKS 10 mL 5   DENTA 5000 PLUS 1.1 % CREA dental cream      estradiol (ESTRACE) 0.1 MG/GM vaginal cream Place 1 Applicatorful vaginally at bedtime.     FLUoxetine (PROZAC) 20 MG capsule TAKE 1 CAPSULE BY MOUTH EVERY DAY 90 capsule 3   fluticasone (FLONASE) 50 MCG/ACT nasal spray Place into both nostrils daily.     nitrofurantoin, macrocrystal-monohydrate, (MACROBID) 100 MG capsule Take 100 mg by mouth at bedtime. Take 1 by mouth at bedtime     Omega-3 Fatty Acids (FISH OIL) 1000 MG CAPS 1 po bid 100 capsule 3   omeprazole (PRILOSEC OTC) 20 MG tablet Take 20 mg by mouth daily as needed.     SYNTHROID 88 MCG tablet Take 1 tablet (88 mcg total) by mouth daily before breakfast. 45 tablet 6   SYRINGE-NEEDLE, DISP, 3 ML (B-D 3CC LUER-LOK SYR 25GX1") 25G X 1" 3 ML MISC USE AS DIRECTED FOR B12 INJECTION Annual appt due in must see provider for future refills 8 each 0   TOBRADEX ST 0.3-0.05 % SUSP Place into the right eye.     Turmeric (QC TUMERIC COMPLEX PO) Take 475 mg by mouth daily.     ALPRAZolam (XANAX) 0.25 MG tablet Take 1 tablet (0.25 mg total) by mouth 2 (two) times daily as needed for anxiety. 60 tablet 2   No facility-administered  medications prior to visit.    ROS: Review of Systems  Constitutional:  Negative for activity change, appetite change, chills, fatigue and unexpected weight change.  HENT:  Negative for congestion, mouth sores and sinus pressure.   Eyes:  Negative for visual disturbance.  Respiratory:  Negative for cough and chest tightness.   Gastrointestinal:  Negative for abdominal pain and nausea.  Genitourinary:  Negative for difficulty urinating, frequency and vaginal pain.  Musculoskeletal:  Negative for back pain and gait problem.  Skin:  Negative for pallor and rash.  Neurological:  Negative for dizziness, tremors, weakness, numbness and headaches.  Psychiatric/Behavioral:  Negative for confusion and sleep disturbance.     Objective:  BP 120/72 (BP Location: Left Arm, Patient Position: Sitting, Cuff Size: Large)   Pulse 68   Temp 98.6 F (37 C) (Oral)   Wt 150 lb (68 kg)   SpO2 95%   BMI 24.96 kg/m   BP Readings from Last 3 Encounters:  07/03/22 120/72  03/24/22 128/84  03/03/22 138/72    Wt Readings from Last 3 Encounters:  07/03/22 150 lb (68 kg)  03/24/22 151 lb (68.5 kg)  03/03/22 153 lb 12.8 oz (69.8 kg)    Physical Exam Constitutional:  General: She is not in acute distress.    Appearance: Normal appearance. She is well-developed.  HENT:     Head: Normocephalic.     Right Ear: External ear normal.     Left Ear: External ear normal.     Nose: Nose normal.  Eyes:     General:        Right eye: No discharge.        Left eye: No discharge.     Conjunctiva/sclera: Conjunctivae normal.     Pupils: Pupils are equal, round, and reactive to light.  Neck:     Thyroid: No thyromegaly.     Vascular: No JVD.     Trachea: No tracheal deviation.  Cardiovascular:     Rate and Rhythm: Normal rate and regular rhythm.     Heart sounds: Normal heart sounds.  Pulmonary:     Effort: No respiratory distress.     Breath sounds: No stridor. No wheezing.  Abdominal:      General: Bowel sounds are normal. There is no distension.     Palpations: Abdomen is soft. There is no mass.     Tenderness: There is no abdominal tenderness. There is no guarding or rebound.  Musculoskeletal:        General: No tenderness.     Cervical back: Normal range of motion and neck supple. No rigidity.  Lymphadenopathy:     Cervical: No cervical adenopathy.  Skin:    Findings: No erythema or rash.  Neurological:     Cranial Nerves: No cranial nerve deficit.     Motor: No abnormal muscle tone.     Coordination: Coordination normal.     Deep Tendon Reflexes: Reflexes normal.  Psychiatric:        Behavior: Behavior normal.        Thought Content: Thought content normal.        Judgment: Judgment normal.     Lab Results  Component Value Date   WBC 6.2 06/14/2021   HGB 13.4 06/14/2021   HCT 39.5 06/14/2021   PLT 167.0 06/14/2021   GLUCOSE 93 02/20/2022   CHOL 178 02/20/2022   TRIG 188.0 (H) 02/20/2022   HDL 42.80 02/20/2022   LDLDIRECT 137.0 06/14/2021   LDLCALC 98 02/20/2022   ALT 13 02/20/2022   AST 18 02/20/2022   NA 139 02/20/2022   K 4.2 02/20/2022   CL 104 02/20/2022   CREATININE 0.78 02/20/2022   BUN 11 02/20/2022   CO2 25 02/20/2022   TSH 0.84 04/12/2022   HGBA1C 5.7 09/15/2020    CT Chest Wo Contrast  Result Date: 02/03/2020 CLINICAL DATA:  Lung nodule follow-up. EXAM: CT CHEST WITHOUT CONTRAST TECHNIQUE: Multidetector CT imaging of the chest was performed following the standard protocol without IV contrast. COMPARISON:  CT chest dated June 27, 2019. FINDINGS: Cardiovascular: No significant vascular findings. Normal heart size. No pericardial effusion. No thoracic aortic aneurysm. Coronary, aortic arch, and branch vessel atherosclerotic vascular disease. Mediastinum/Nodes: No enlarged mediastinal or axillary lymph nodes. Thyroid gland, trachea, and esophagus demonstrate no significant findings. Lungs/Pleura: Unchanged slightly irregular 7 mm nodule in  the superior segment of the right lower lobe (series 3, image 76), stable since October 2020. 4 mm ground-glass nodule in the right upper lobe (series 3, image 39) is also unchanged since the prior study. No new pulmonary nodule. No focal consolidation, pleural effusion, or pneumothorax. Unchanged mild biapical pleuroparenchymal scarring. Upper Abdomen: No acute abnormality. Musculoskeletal: No acute or significant osseous findings. Unchanged  small mixed sclerotic and lucent lesion in the sternal body, stable since October 2020, benign. IMPRESSION: 1. Unchanged 7 mm nodule in the superior segment of the right lower lobe. No further follow-up required. 2. Additional 4 mm ground-glass nodule in the right upper lobe is also unchanged since October 2020. No further follow-up recommended. 3. Aortic Atherosclerosis (ICD10-I70.0). Electronically Signed   By: Obie Dredge M.D.   On: 02/03/2020 16:59    Assessment & Plan:   Problem List Items Addressed This Visit     Dyslipidemia    On Red rice yeast. Statin intolerant Check lipids      Relevant Orders   Lipid panel   Essential hypertension    Cont w/Atenolol Rx      Relevant Orders   Comprehensive metabolic panel   Lipid panel   Urinalysis   Hypothyroidism    Check TSH, FT4      Hyperglycemia - Primary    Check A1c      Relevant Orders   Comprehensive metabolic panel   Lipid panel   B12 deficiency    On B12 inj q 2 wks      Anxiety    Xanax low dose  Potential benefits of a long term benzodiazepines  use as well as potential risks  and complications were explained to the patient and were aknowledged.      Relevant Medications   ALPRAZolam (XANAX) 0.25 MG tablet      Meds ordered this encounter  Medications   ALPRAZolam (XANAX) 0.25 MG tablet    Sig: Take 1 tablet (0.25 mg total) by mouth 2 (two) times daily as needed for anxiety.    Dispense:  60 tablet    Refill:  2      Follow-up: No follow-ups on file.  Sonda Primes, MD

## 2022-07-03 NOTE — Assessment & Plan Note (Signed)
Check TSH, FT4 

## 2022-07-03 NOTE — Assessment & Plan Note (Signed)
Cont w/Atenolol Rx 

## 2022-07-03 NOTE — Assessment & Plan Note (Signed)
On B12 inj q 2 wks ?

## 2022-07-03 NOTE — Assessment & Plan Note (Signed)
Xanax low dose  Potential benefits of a long term benzodiazepines  use as well as potential risks  and complications were explained to the patient and were aknowledged.

## 2022-07-03 NOTE — Assessment & Plan Note (Signed)
On Red rice yeast. Statin intolerant Check lipids

## 2022-07-03 NOTE — Assessment & Plan Note (Signed)
Check A1c. 

## 2022-07-12 ENCOUNTER — Other Ambulatory Visit (INDEPENDENT_AMBULATORY_CARE_PROVIDER_SITE_OTHER): Payer: Medicare Other

## 2022-07-12 DIAGNOSIS — E785 Hyperlipidemia, unspecified: Secondary | ICD-10-CM | POA: Diagnosis not present

## 2022-07-12 DIAGNOSIS — I10 Essential (primary) hypertension: Secondary | ICD-10-CM | POA: Diagnosis not present

## 2022-07-12 DIAGNOSIS — R739 Hyperglycemia, unspecified: Secondary | ICD-10-CM | POA: Diagnosis not present

## 2022-07-12 LAB — URINALYSIS, ROUTINE W REFLEX MICROSCOPIC
Bilirubin Urine: NEGATIVE
Hgb urine dipstick: NEGATIVE
Ketones, ur: NEGATIVE
Nitrite: NEGATIVE
Specific Gravity, Urine: 1.01 (ref 1.000–1.030)
Total Protein, Urine: NEGATIVE
Urine Glucose: NEGATIVE
Urobilinogen, UA: 0.2 (ref 0.0–1.0)
pH: 6.5 (ref 5.0–8.0)

## 2022-07-12 LAB — COMPREHENSIVE METABOLIC PANEL
ALT: 19 U/L (ref 0–35)
AST: 24 U/L (ref 0–37)
Albumin: 4.4 g/dL (ref 3.5–5.2)
Alkaline Phosphatase: 43 U/L (ref 39–117)
BUN: 12 mg/dL (ref 6–23)
CO2: 26 mEq/L (ref 19–32)
Calcium: 9.1 mg/dL (ref 8.4–10.5)
Chloride: 102 mEq/L (ref 96–112)
Creatinine, Ser: 0.85 mg/dL (ref 0.40–1.20)
GFR: 64.15 mL/min (ref >60.00)
Glucose, Bld: 97 mg/dL (ref 70–99)
Potassium: 4.3 mEq/L (ref 3.5–5.1)
Sodium: 137 mEq/L (ref 135–145)
Total Bilirubin: 0.9 mg/dL (ref 0.2–1.2)
Total Protein: 7.1 g/dL (ref 6.0–8.3)

## 2022-07-12 LAB — LIPID PANEL
Cholesterol: 201 mg/dL — ABNORMAL HIGH (ref 0–200)
HDL: 47.6 mg/dL (ref >39.00)
LDL Cholesterol: 127 mg/dL — ABNORMAL HIGH (ref 0–99)
NonHDL: 153.58
Total CHOL/HDL Ratio: 4
Triglycerides: 132 mg/dL (ref 0.0–149.0)
VLDL: 26.4 mg/dL (ref 0.0–40.0)

## 2022-07-14 ENCOUNTER — Other Ambulatory Visit: Payer: Self-pay | Admitting: Internal Medicine

## 2022-07-21 ENCOUNTER — Telehealth: Payer: Self-pay | Admitting: Internal Medicine

## 2022-07-21 NOTE — Telephone Encounter (Signed)
Contacted [name] to schedule their annual wellness visit. Appointment made for 08/03/2022.  [facility] [address] Care Guide [facility] AWV TEAM Direct Dial: [phone]

## 2022-08-02 DIAGNOSIS — L821 Other seborrheic keratosis: Secondary | ICD-10-CM | POA: Diagnosis not present

## 2022-08-03 ENCOUNTER — Ambulatory Visit (INDEPENDENT_AMBULATORY_CARE_PROVIDER_SITE_OTHER): Payer: Medicare Other

## 2022-08-03 VITALS — Ht 65.0 in | Wt 150.0 lb

## 2022-08-03 DIAGNOSIS — Z0000 Encounter for general adult medical examination without abnormal findings: Secondary | ICD-10-CM

## 2022-08-03 NOTE — Progress Notes (Addendum)
Subjective:   Lauren Mcgee is a 82 y.o. female who presents for Medicare Annual (Subsequent) preventive examination.  I connected with  [name] on 08/03/22 by a audio enabled telemedicine application and verified that I am speaking with the correct person using two identifiers.  Patient Location: Home  Provider Location: Home Office  I discussed the limitations of evaluation and management by telemedicine. The patient expressed understanding and agreed to proceed.  Review of Systems     Cardiac Risk Factors include: advanced age (>37men, >48 women);dyslipidemia;hypertension     Objective:    Today's Vitals   08/03/22 1515  Weight: 150 lb (68 kg)  Height: 5\' 5"  (1.651 m)   Body mass index is 24.96 kg/m.     08/03/2022    3:23 PM 07/25/2021   11:52 AM 07/23/2020   12:57 PM 08/18/2019   11:50 AM 10/04/2016    1:35 PM  Advanced Directives  Does Patient Have a Medical Advance Directive? Yes No Yes No No  Type of Advance Directive Living will;Healthcare Power of [employer] Power of Attorney    Does patient want to make changes to medical advance directive? No - Patient declined  No - Patient declined    Copy of Healthcare Power of Attorney in Chart? No - copy requested  No - copy requested    Would patient like information on creating a medical advance directive?  No - Patient declined  No - Patient declined     Current Medications (verified) Outpatient Encounter Medications as of 08/03/2022  Medication Sig   ALPRAZolam (XANAX) 0.25 MG tablet Take 1 tablet (0.25 mg total) by mouth 2 (two) times daily as needed for anxiety.   aspirin 81 MG chewable tablet Chew 81 mg by mouth daily.   atenolol (TENORMIN) 25 MG tablet TAKE 1 TABLET BY MOUTH EVERY DAY   B-D 3CC LUER-LOK SYR 25GX1" 25G X 1" 3 ML MISC USE AS DIRECTED FOR B12 INJECTION ANNUAL APPT DUE IN MUST SEE PROVIDER FOR FUTURE REFILLS   carvedilol (COREG) 6.25 MG tablet Take 6.25 mg by mouth 2 (two) times daily  with a meal.   cholecalciferol (VITAMIN D) 1000 UNITS tablet Take 1,000 Units by mouth daily.   cyanocobalamin (VITAMIN B12) 1000 MCG/ML injection GIVE 1000 MCG OF VITAMIN B12 SUBCUTANEOUSLY DAILY X 1 WEEK, THEN WEEKLY FOR 1 MONTH, THEN ONCE EVERY 2 WEEKS   DENTA 5000 PLUS 1.1 % CREA dental cream    estradiol (ESTRACE) 0.1 MG/GM vaginal cream Place 1 Applicatorful vaginally at bedtime.   FLUoxetine (PROZAC) 20 MG capsule TAKE 1 CAPSULE BY MOUTH EVERY DAY   fluticasone (FLONASE) 50 MCG/ACT nasal spray Place into both nostrils daily.   nitrofurantoin, macrocrystal-monohydrate, (MACROBID) 100 MG capsule Take 100 mg by mouth at bedtime. Take 1 by mouth at bedtime   Omega-3 Fatty Acids (FISH OIL) 1000 MG CAPS 1 po bid   omeprazole (PRILOSEC OTC) 20 MG tablet Take 20 mg by mouth daily as needed.   SYNTHROID 88 MCG tablet Take 1 tablet (88 mcg total) by mouth daily before breakfast.   TOBRADEX ST 0.3-0.05 % SUSP Place into the right eye.   Turmeric (QC TUMERIC COMPLEX PO) Take 475 mg by mouth daily.   No facility-administered encounter medications on file as of 08/03/2022.    Allergies (verified) Influenza vaccines, Lexapro [escitalopram], and Pravastatin   History: Past Medical History:  Diagnosis Date   Diverticulosis of colon (without mention of hemorrhage)  GERD (gastroesophageal reflux disease)    Hx of colonic polyp 2001   TUBULAR ADENOMA   Hyperlipidemia    Hypertension    Hypothyroidism    IBS (irritable bowel syndrome)    PVD (peripheral vascular disease) (HCC)    Thyroid cancer (HCC) 2011   Past Surgical History:  Procedure Laterality Date   ABDOMINAL HYSTERECTOMY  1991   COLONOSCOPY  2010   562.10   ESOPHAGOGASTRODUODENOSCOPY     multiple   THYROIDECTOMY  2010   and 131I treatment at [facility]- Dr. Lady Gary   Family History  Problem Relation Age of Onset   Hyperlipidemia Mother    Heart disease Mother    Hypertension Other    Colon cancer Paternal Aunt 40   Colon  cancer Son 36   Colon polyps Neg Hx    Liver cancer Neg Hx    Pancreatic cancer Neg Hx    Rectal cancer Neg Hx    Stomach cancer Neg Hx    Social History   Socioeconomic History   Marital status: Married    Spouse name: Not on file   Number of children: 2   Years of education: Not on file   Highest education level: Not on file  Occupational History   Occupation: retired  Tobacco Use   Smoking status: Former    Packs/day: .5    Types: Cigarettes    Quit date: 03/06/1958    Years since quitting: 64.4   Smokeless tobacco: Never  Vaping Use   Vaping Use: Never used  Substance and Sexual Activity   Alcohol use: No   Drug use: No   Sexual activity: Yes  Other Topics Concern   Not on file  Social History Narrative   Retired   Married 2 kids   Regular exercise: not outside the home    Caffeine use: coffee daily   Former smoker, no alcohol, no drugs   Social Determinants of Corporate investment banker Strain: Low Risk  (08/03/2022)   Overall Financial Resource Strain (CARDIA)    Difficulty of Paying Living Expenses: Not hard at all  Food Insecurity: No Food Insecurity (08/03/2022)   Hunger Vital Sign    Worried About Running Out of Food in the Last Year: Never true    Ran Out of Food in the Last Year: Never true  Transportation Needs: No Transportation Needs (08/03/2022)   PRAPARE - Administrator, Civil Service (Medical): No    Lack of Transportation (Non-Medical): No  Physical Activity: Sufficiently Active (08/03/2022)   Exercise Vital Sign    Days of Exercise per Week: 5 days    Minutes of Exercise per Session: 30 min  Stress: No Stress Concern Present (08/03/2022)   [employer] of Occupational Health - Occupational Stress Questionnaire    Feeling of Stress : Not at all  Social Connections: Socially Integrated (08/03/2022)   Social Connection and Isolation Panel [NHANES]    Frequency of Communication with Friends and Family: More than three times a week     Frequency of Social Gatherings with Friends and Family: Twice a week    Attends Religious Services: More than 4 times per year    Active Member of [employer] or Organizations: Yes    Attends Engineer, structural: More than 4 times per year    Marital Status: Married    Tobacco Counseling Counseling given: Not Answered   Clinical Intake:  Pre-visit preparation completed: Yes  Pain : No/denies pain  Diabetes: No  How often do you need to have someone help you when you read instructions, pamphlets, or other written materials from your doctor or pharmacy?: 1 - Never  Diabetic?No   Interpreter Needed?: No  Information entered by :: Kandis Fantasia LPN   Activities of Daily Living    08/03/2022    3:23 PM  In your present state of health, do you have any difficulty performing the following activities:  Hearing? 0  Vision? 0  Difficulty concentrating or making decisions? 0  Walking or climbing stairs? 0  Dressing or bathing? 0  Doing errands, shopping? 0  Preparing Food and eating ? N  Using the Toilet? N  In the past six months, have you accidently leaked urine? N  Do you have problems with loss of bowel control? N  Managing your Medications? N  Managing your Finances? N  Housekeeping or managing your Housekeeping? N    Patient Care Team: Plotnikov, Georgina Quint, MD as PCP - [place], Alben Spittle, MD as PCP - Cardiology (Cardiology) Carlus Pavlov, MD as Consulting Physician (Internal Medicine) Mia Creek, MD as Consulting Physician (Ophthalmology) Marcelle Overlie, MD as Consulting Physician (Obstetrics and Gynecology) Albin Felling, OD as Consulting Physician (Optometry)  Indicate any recent Medical Services you may have received from other than [facility] providers in the past year (date may be approximate).     Assessment:   This is a routine wellness examination for Eurydice.  Hearing/Vision screen Hearing Screening - Comments:: Denies hearing  difficulties   Vision Screening - Comments:: Wears rx glasses - up to date with routine eye exams with Dr. Precious Bard    Dietary issues and exercise activities discussed: Current Exercise Habits: Home exercise routine, Type of exercise: walking, Time (Minutes): 30, Frequency (Times/Week): 5, Weekly Exercise (Minutes/Week): 150, Intensity: Mild   Goals Addressed   None   Depression Screen    08/03/2022    3:21 PM 07/03/2022    3:14 PM 07/25/2021   11:45 AM 07/23/2020   12:57 PM 04/07/2020   10:27 AM 01/13/2019   11:44 AM 08/15/2016    4:44 PM  PHQ 2/9 Scores  PHQ - 2 Score 0 0 0 0 0 0 0  PHQ- 9 Score       0    Fall Risk    08/03/2022    3:23 PM 07/03/2022    3:14 PM 07/25/2021   11:52 AM 07/23/2020   12:57 PM 04/07/2020   10:27 AM  Fall Risk   Falls in the past year? 0 0 0 0 0  Number falls in past yr: 0 0 0 0 0  Injury with Fall? 0 0 0 0 0  Risk for fall due to : No Fall Risks No Fall Risks No Fall Risks No Fall Risks No Fall Risks  Follow up Falls prevention discussed;Education provided;Falls evaluation completed Falls evaluation completed Falls evaluation completed Falls evaluation completed     FALL RISK PREVENTION PERTAINING TO THE HOME:  Any stairs in or around the home? Yes  If so, are there any without handrails? No  Home free of loose throw rugs in walkways, pet beds, electrical cords, etc? Yes  Adequate lighting in your home to reduce risk of falls? Yes   ASSISTIVE DEVICES UTILIZED TO PREVENT FALLS:  Life alert? No  Use of a cane, walker or w/c? No  Grab bars in the bathroom? Yes  Shower chair or bench in shower? No  Elevated toilet seat or a handicapped  toilet? Yes   TIMED UP AND GO:  Was the test performed? No . Telephonic visit   Cognitive Function:        08/03/2022    3:23 PM 07/25/2021   11:54 AM  6CIT Screen  What Year? 0 points 0 points  What month? 0 points 0 points  What time? 0 points 0 points  Count back from 20 0 points 0 points  Months in  reverse 0 points 0 points  Repeat phrase 0 points 0 points  Total Score 0 points 0 points    Immunizations Immunization History  Administered Date(s) Administered   Fluad Quad(high Dose 65+) 11/19/2018, 12/16/2019, 12/14/2020, 01/11/2022   Influenza Whole 11/26/2007   Influenza, High Dose Seasonal PF 12/06/2017   Influenza,inj,Quad PF,6+ Mos 12/06/2016   Moderna SARS-COV2 Booster Vaccination 01/02/2020   Moderna Sars-Covid-2 Vaccination 03/21/2019, 04/18/2019   PNEUMOCOCCAL CONJUGATE-20 09/15/2020   Pneumococcal Conjugate-13 10/08/2013   Pneumococcal Polysaccharide-23 11/24/2008   Zoster, Live 11/10/2010    TDAP status: Due, Education has been provided regarding the importance of this vaccine. Advised may receive this vaccine at local pharmacy or Health Dept. Aware to provide a copy of the vaccination record if obtained from local pharmacy or Health Dept. Verbalized acceptance and understanding.  Pneumococcal vaccine status: Up to date  Covid-19 vaccine status: Information provided on how to obtain vaccines.   Qualifies for Shingles Vaccine? Yes   Zostavax completed Yes   Shingrix Completed?: No.    Education has been provided regarding the importance of this vaccine. Patient has been advised to call insurance company to determine out of pocket expense if they have not yet received this vaccine. Advised may also receive vaccine at local pharmacy or Health Dept. Verbalized acceptance and understanding.  Screening Tests Health Maintenance  Topic Date Due   DTaP/Tdap/Td (1 - Tdap) Never done   DEXA SCAN  Never done   COVID-19 Vaccine (3 - Moderna risk series) 01/30/2020   INFLUENZA VACCINE  10/05/2022   Medicare Annual Wellness (AWV)  08/03/2023   Pneumonia Vaccine 40+ Years old  Completed   HPV VACCINES  Aged Out   Zoster Vaccines- Shingrix  Discontinued    Health Maintenance  Health Maintenance Due  Topic Date Due   DTaP/Tdap/Td (1 - Tdap) Never done   DEXA SCAN  Never  done   COVID-19 Vaccine (3 - Moderna risk series) 01/30/2020    Colorectal cancer screening: No longer required.   Mammogram status: No longer required due to age and preference.  Bone density status:  Records requested from gyn  Lung Cancer Screening: (Low Dose CT Chest recommended if Age 52-80 years, 30 pack-year currently smoking OR have quit w/in 15years.) does not qualify.   Lung Cancer Screening Referral: n/a  Additional Screening:  Hepatitis C Screening: does not qualify  Vision Screening: Recommended annual ophthalmology exams for early detection of glaucoma and other disorders of the eye. Is the patient up to date with their annual eye exam?  Yes  Who is the provider or what is the name of the office in which the patient attends annual eye exams? Dr. Precious Bard  If pt is not established with a provider, would they like to be referred to a provider to establish care? No .   Dental Screening: Recommended annual dental exams for proper oral hygiene  Community Resource Referral / Chronic Care Management: CRR required this visit?  No   CCM required this visit?  No      Plan:  I have personally reviewed and noted the following in the patient's chart:   Medical and social history Use of alcohol, tobacco or illicit drugs  Current medications and supplements including opioid prescriptions. Patient is not currently taking opioid prescriptions. Functional ability and status Nutritional status Physical activity Advanced directives List of other physicians Hospitalizations, surgeries, and ER visits in previous 12 months Vitals Screenings to include cognitive, depression, and falls Referrals and appointments  In addition, I have reviewed and discussed with patient certain preventive protocols, quality metrics, and best practice recommendations. A written personalized care plan for preventive services as well as general preventive health recommendations were provided to  patient.     Durwin Nora, [place]   1/61/0960   Due to this being a virtual visit, the after visit summary with patients personalized plan was offered to patient via mail or my-chart.  per request, patient was mailed a copy of AVS  Nurse Notes: No concerns     Medical screening examination/treatment/procedure(s) were performed by non-physician practitioner and as supervising physician I was immediately available for consultation/collaboration.  I agree with above. Jacinta Shoe, MD

## 2022-08-03 NOTE — Patient Instructions (Addendum)
[name] , Thank you for taking time to come for your Medicare Wellness Visit. I appreciate your ongoing commitment to your health goals. Please review the following plan we discussed and let me know if I can assist you in the future.   These are the goals we discussed:  Goals      My goal is to stay independent, active and socially active in [place].  Also I do not want to fall, so I am very careful with using my handrails when going up or down on the stairs.     Patient Stated     Continue to keep living and having birthdays.        This is a list of the screening recommended for you and due dates:  Health Maintenance  Topic Date Due   DTaP/Tdap/Td vaccine (1 - Tdap) Never done   DEXA scan (bone density measurement)  Never done   COVID-19 Vaccine (3 - Moderna risk series) 01/30/2020   Flu Shot  10/05/2022   Medicare Annual Wellness Visit  08/03/2023   Pneumonia Vaccine  Completed   HPV Vaccine  Aged Out   Zoster (Shingles) Vaccine  Discontinued    Advanced directives: Information on Advanced Care Planning can be found at [facility] Advance Health Care Directives Advance Health Care Directives ([email or web address]  Please bring a copy of your health care power of attorney and living will to the office to be added to your chart at your convenience.   Conditions/risks identified: Aim for 30 minutes of exercise or brisk walking, 6-8 glasses of water, and 5 servings of fruits and vegetables each day.   Next appointment: Follow up in one year for your annual wellness visit    Preventive Care 65 Years and Older, Female Preventive care refers to lifestyle choices and visits with your health care provider that can promote health and wellness. What does preventive care include? A yearly physical exam. This is also called an annual well check. Dental exams once or twice a year. Routine eye exams. Ask your health care provider how often you should have your eyes  checked. Personal lifestyle choices, including: Daily care of your teeth and gums. Regular physical activity. Eating a healthy diet. Avoiding tobacco and drug use. Limiting alcohol use. Practicing safe sex. Taking low-dose aspirin every day. Taking vitamin and mineral supplements as recommended by your health care provider. What happens during an annual well check? The services and screenings done by your health care provider during your annual well check will depend on your age, overall health, lifestyle risk factors, and family history of disease. Counseling  Your health care provider may ask you questions about your: Alcohol use. Tobacco use. Drug use. Emotional well-being. Home and relationship well-being. Sexual activity. Eating habits. History of falls. Memory and ability to understand (cognition). Work and work Astronomer. Reproductive health. Screening  You may have the following tests or measurements: Height, weight, and BMI. Blood pressure. Lipid and cholesterol levels. These may be checked every 5 years, or more frequently if you are over 67 years old. Skin check. Lung cancer screening. You may have this screening every year starting at age 48 if you have a 30-pack-year history of smoking and currently smoke or have quit within the past 15 years. Fecal occult blood test (FOBT) of the stool. You may have this test every year starting at age 2. Flexible sigmoidoscopy or colonoscopy. You may have a sigmoidoscopy every 5 years or a colonoscopy every  10 years starting at age 22. Hepatitis C blood test. Hepatitis B blood test. Sexually transmitted disease (STD) testing. Diabetes screening. This is done by checking your blood sugar (glucose) after you have not eaten for a while (fasting). You may have this done every 1-3 years. Bone density scan. This is done to screen for osteoporosis. You may have this done starting at age 23. Mammogram. This may be done every 1-2  years. Talk to your health care provider about how often you should have regular mammograms. Talk with your health care provider about your test results, treatment options, and if necessary, the need for more tests. Vaccines  Your health care provider may recommend certain vaccines, such as: Influenza vaccine. This is recommended every year. Tetanus, diphtheria, and acellular pertussis (Tdap, Td) vaccine. You may need a Td booster every 10 years. Zoster vaccine. You may need this after age 52. Pneumococcal 13-valent conjugate (PCV13) vaccine. One dose is recommended after age 74. Pneumococcal polysaccharide (PPSV23) vaccine. One dose is recommended after age 73. Talk to your health care provider about which screenings and vaccines you need and how often you need them. This information is not intended to replace advice given to you by your health care provider. Make sure you discuss any questions you have with your health care provider. Document Released: 03/19/2015 Document Revised: 11/10/2015 Document Reviewed: 12/22/2014 Elsevier Interactive Patient Education  2017 [employer].  Fall Prevention in the Home Falls can cause injuries. They can happen to people of all ages. There are many things you can do to make your home safe and to help prevent falls. What can I do on the outside of my home? Regularly fix the edges of walkways and driveways and fix any cracks. Remove anything that might make you trip as you walk through a door, such as a raised step or threshold. Trim any bushes or trees on the path to your home. Use bright outdoor lighting. Clear any walking paths of anything that might make someone trip, such as rocks or tools. Regularly check to see if handrails are loose or broken. Make sure that both sides of any steps have handrails. Any raised decks and porches should have guardrails on the edges. Have any leaves, snow, or ice cleared regularly. Use sand or salt on walking paths  during winter. Clean up any spills in your garage right away. This includes oil or grease spills. What can I do in the bathroom? Use night lights. Install grab bars by the toilet and in the tub and shower. Do not use towel bars as grab bars. Use non-skid mats or decals in the tub or shower. If you need to sit down in the shower, use a plastic, non-slip stool. Keep the floor dry. Clean up any water that spills on the floor as soon as it happens. Remove soap buildup in the tub or shower regularly. Attach bath mats securely with double-sided non-slip rug tape. Do not have throw rugs and other things on the floor that can make you trip. What can I do in the bedroom? Use night lights. Make sure that you have a light by your bed that is easy to reach. Do not use any sheets or blankets that are too big for your bed. They should not hang down onto the floor. Have a firm chair that has side arms. You can use this for support while you get dressed. Do not have throw rugs and other things on the floor that can make you  trip. What can I do in the kitchen? Clean up any spills right away. Avoid walking on wet floors. Keep items that you use a lot in easy-to-reach places. If you need to reach something above you, use a strong step stool that has a grab bar. Keep electrical cords out of the way. Do not use floor polish or wax that makes floors slippery. If you must use wax, use non-skid floor wax. Do not have throw rugs and other things on the floor that can make you trip. What can I do with my stairs? Do not leave any items on the stairs. Make sure that there are handrails on both sides of the stairs and use them. Fix handrails that are broken or loose. Make sure that handrails are as long as the stairways. Check any carpeting to make sure that it is firmly attached to the stairs. Fix any carpet that is loose or worn. Avoid having throw rugs at the top or bottom of the stairs. If you do have throw  rugs, attach them to the floor with carpet tape. Make sure that you have a light switch at the top of the stairs and the bottom of the stairs. If you do not have them, ask someone to add them for you. What else can I do to help prevent falls? Wear shoes that: Do not have high heels. Have rubber bottoms. Are comfortable and fit you well. Are closed at the toe. Do not wear sandals. If you use a stepladder: Make sure that it is fully opened. Do not climb a closed stepladder. Make sure that both sides of the stepladder are locked into place. Ask someone to hold it for you, if possible. Clearly mark and make sure that you can see: Any grab bars or handrails. First and last steps. Where the edge of each step is. Use tools that help you move around (mobility aids) if they are needed. These include: Canes. Walkers. Scooters. Crutches. Turn on the lights when you go into a dark area. Replace any light bulbs as soon as they burn out. Set up your furniture so you have a clear path. Avoid moving your furniture around. If any of your floors are uneven, fix them. If there are any pets around you, be aware of where they are. Review your medicines with your doctor. Some medicines can make you feel dizzy. This can increase your chance of falling. Ask your doctor what other things that you can do to help prevent falls. This information is not intended to replace advice given to you by your health care provider. Make sure you discuss any questions you have with your health care provider. Document Released: 12/17/2008 Document Revised: 07/29/2015 Document Reviewed: 03/27/2014 Elsevier Interactive Patient Education  2017 [employer].

## 2022-09-13 ENCOUNTER — Other Ambulatory Visit: Payer: Self-pay | Admitting: Internal Medicine

## 2022-11-07 ENCOUNTER — Ambulatory Visit: Payer: Medicare Other | Admitting: Internal Medicine

## 2022-11-28 ENCOUNTER — Ambulatory Visit: Payer: Medicare Other | Admitting: Internal Medicine

## 2022-12-12 ENCOUNTER — Encounter: Payer: Self-pay | Admitting: Internal Medicine

## 2022-12-12 ENCOUNTER — Ambulatory Visit: Payer: Medicare Other | Admitting: Internal Medicine

## 2022-12-12 VITALS — BP 130/60 | HR 70 | Temp 98.6°F | Ht 65.0 in | Wt 155.0 lb

## 2022-12-12 DIAGNOSIS — F419 Anxiety disorder, unspecified: Secondary | ICD-10-CM | POA: Diagnosis not present

## 2022-12-12 DIAGNOSIS — I700 Atherosclerosis of aorta: Secondary | ICD-10-CM | POA: Diagnosis not present

## 2022-12-12 DIAGNOSIS — I6523 Occlusion and stenosis of bilateral carotid arteries: Secondary | ICD-10-CM | POA: Diagnosis not present

## 2022-12-12 DIAGNOSIS — F411 Generalized anxiety disorder: Secondary | ICD-10-CM | POA: Diagnosis not present

## 2022-12-12 DIAGNOSIS — I10 Essential (primary) hypertension: Secondary | ICD-10-CM | POA: Diagnosis not present

## 2022-12-12 DIAGNOSIS — E538 Deficiency of other specified B group vitamins: Secondary | ICD-10-CM

## 2022-12-12 DIAGNOSIS — I2583 Coronary atherosclerosis due to lipid rich plaque: Secondary | ICD-10-CM | POA: Diagnosis not present

## 2022-12-12 DIAGNOSIS — E785 Hyperlipidemia, unspecified: Secondary | ICD-10-CM | POA: Diagnosis not present

## 2022-12-12 LAB — URINALYSIS, ROUTINE W REFLEX MICROSCOPIC
Bilirubin Urine: NEGATIVE
Hgb urine dipstick: NEGATIVE
Ketones, ur: NEGATIVE
Nitrite: NEGATIVE
RBC / HPF: NONE SEEN (ref 0–?)
Specific Gravity, Urine: 1.01 (ref 1.000–1.030)
Total Protein, Urine: NEGATIVE
Urine Glucose: NEGATIVE
Urobilinogen, UA: 0.2 (ref 0.0–1.0)
pH: 6.5 (ref 5.0–8.0)

## 2022-12-12 LAB — CBC WITH DIFFERENTIAL/PLATELET
Basophils Absolute: 0 10*3/uL (ref 0.0–0.1)
Basophils Relative: 0.4 % (ref 0.0–3.0)
Eosinophils Absolute: 0.1 10*3/uL (ref 0.0–0.7)
Eosinophils Relative: 1.8 % (ref 0.0–5.0)
HCT: 39.7 % (ref 36.0–46.0)
Hemoglobin: 13.3 g/dL (ref 12.0–15.0)
Lymphocytes Relative: 19.6 % (ref 12.0–46.0)
Lymphs Abs: 1.6 10*3/uL (ref 0.7–4.0)
MCHC: 33.6 g/dL (ref 30.0–36.0)
MCV: 85.8 fL (ref 78.0–100.0)
Monocytes Absolute: 0.7 10*3/uL (ref 0.1–1.0)
Monocytes Relative: 8.5 % (ref 3.0–12.0)
Neutro Abs: 5.6 10*3/uL (ref 1.4–7.7)
Neutrophils Relative %: 69.7 % (ref 43.0–77.0)
Platelets: 186 10*3/uL (ref 150.0–400.0)
RBC: 4.63 Mil/uL (ref 3.87–5.11)
RDW: 14.2 % (ref 11.5–15.5)
WBC: 8.1 10*3/uL (ref 4.0–10.5)

## 2022-12-12 LAB — VITAMIN B12: Vitamin B-12: 289 pg/mL (ref 211–911)

## 2022-12-12 LAB — TSH: TSH: 0.57 u[IU]/mL (ref 0.35–5.50)

## 2022-12-12 MED ORDER — ALPRAZOLAM 0.25 MG PO TABS: 0.2500 mg | ORAL_TABLET | Freq: Two times a day (BID) | ORAL | 2 refills | Status: DC | PRN

## 2022-12-12 NOTE — Assessment & Plan Note (Signed)
Cont w/Atenolol Rx 

## 2022-12-12 NOTE — Assessment & Plan Note (Signed)
Cont on Prozac  Xanax rare use  Potential benefits of a long term opioids use as well as potential risks (i.e. addiction risk, apnea etc) and complications (i.e. Somnolence, constipation and others) were explained to the patient and were aknowledged.

## 2022-12-12 NOTE — Assessment & Plan Note (Signed)
On B12 inj q 2 wks ?

## 2022-12-12 NOTE — Assessment & Plan Note (Signed)
2020 Coronary calcium CT score of 122.  On fish oil or krill oil, red rice yeast  Statin intolerant

## 2022-12-12 NOTE — Progress Notes (Signed)
Subjective:  Patient ID: Lauren Mcgee, female    DOB: 08/22/1940  Age: 82 y.o. MRN: [id number]  CC: Anxiety (Pt states she feels as though she has been periodically experiencing some anxiety and during these episodes notice of feeling her heart is beating faster.)   HPI Lauren Mcgee states she feels as though she has been periodically experiencing some anxiety and during these episodes notice of feeling her heart is beating faster, tense, jittery inside  Outpatient Medications Prior to Visit  Medication Sig Dispense Refill   aspirin 81 MG chewable tablet Chew 81 mg by mouth daily.     atenolol (TENORMIN) 25 MG tablet TAKE 1 TABLET BY MOUTH EVERY DAY 90 tablet 3   B-D 3CC LUER-LOK SYR 25GX1" 25G X 1" 3 ML MISC USE AS DIRECTED FOR B12 INJECTION ANNUAL APPT DUE IN MUST SEE PROVIDER FOR FUTURE REFILLS 8 each 0   carvedilol (COREG) 6.25 MG tablet Take 6.25 mg by mouth 2 (two) times daily with a meal.     cholecalciferol (VITAMIN D) 1000 UNITS tablet Take 1,000 Units by mouth daily.     cyanocobalamin (VITAMIN B12) 1000 MCG/ML injection GIVE 1000 MCG OF VITAMIN B12 SUBCUTANEOUSLY DAILY X 1 WEEK, THEN WEEKLY FOR 1 MONTH, THEN ONCE EVERY 2 WEEKS 10 mL 5   DENTA 5000 PLUS 1.1 % CREA dental cream      estradiol (ESTRACE) 0.1 MG/GM vaginal cream Place 1 Applicatorful vaginally at bedtime.     FLUoxetine (PROZAC) 20 MG capsule TAKE 1 CAPSULE BY MOUTH EVERY DAY 90 capsule 3   fluticasone (FLONASE) 50 MCG/ACT nasal spray Place into both nostrils daily.     nitrofurantoin, macrocrystal-monohydrate, (MACROBID) 100 MG capsule Take 100 mg by mouth at bedtime. Take 1 by mouth at bedtime     Omega-3 Fatty Acids (FISH OIL) 1000 MG CAPS 1 po bid 100 capsule 3   omeprazole (PRILOSEC OTC) 20 MG tablet Take 20 mg by mouth daily as needed.     SYNTHROID 100 MCG tablet TAKE 1 TABLET BY MOUTH EVERY DAY BEFORE BREAKFAST 90 tablet 0   SYNTHROID 88 MCG tablet TAKE 1 TABLET BY MOUTH DAILY BEFORE BREAKFAST. 90 tablet 3    TOBRADEX ST 0.3-0.05 % SUSP Place into the right eye.     Turmeric (QC TUMERIC COMPLEX PO) Take 475 mg by mouth daily.     ALPRAZolam (XANAX) 0.25 MG tablet Take 1 tablet (0.25 mg total) by mouth 2 (two) times daily as needed for anxiety. 60 tablet 2   No facility-administered medications prior to visit.    ROS: Review of Systems  Constitutional:  Negative for activity change, appetite change, chills, fatigue and unexpected weight change.  HENT:  Negative for congestion, mouth sores and sinus pressure.   Eyes:  Negative for visual disturbance.  Respiratory:  Negative for cough and chest tightness.   Gastrointestinal:  Negative for abdominal pain and nausea.  Genitourinary:  Negative for difficulty urinating, frequency and vaginal pain.  Musculoskeletal:  Negative for back pain and gait problem.  Skin:  Negative for pallor and rash.  Neurological:  Negative for dizziness, tremors, weakness, numbness and headaches.  Psychiatric/Behavioral:  Negative for confusion, decreased concentration, sleep disturbance and suicidal ideas. The patient is nervous/anxious.     Objective:  BP 130/60 (BP Location: Left Arm, Patient Position: Sitting, Cuff Size: Normal)   Pulse 70   Temp 98.6 F (37 C) (Oral)   Ht 5\' 5"  (1.651 m)   Wt 155  lb (70.3 kg)   SpO2 96%   BMI 25.79 kg/m   BP Readings from Last 3 Encounters:  12/12/22 130/60  07/03/22 120/72  03/24/22 128/84    Wt Readings from Last 3 Encounters:  12/12/22 155 lb (70.3 kg)  08/03/22 150 lb (68 kg)  07/03/22 150 lb (68 kg)    Physical Exam Constitutional:      General: She is not in acute distress.    Appearance: Normal appearance. She is well-developed.  HENT:     Head: Normocephalic.     Right Ear: External ear normal.     Left Ear: External ear normal.     Nose: Nose normal.  Eyes:     General:        Right eye: No discharge.        Left eye: No discharge.     Conjunctiva/sclera: Conjunctivae normal.     Pupils: Pupils  are equal, round, and reactive to light.  Neck:     Thyroid: No thyromegaly.     Vascular: No JVD.     Trachea: No tracheal deviation.  Cardiovascular:     Rate and Rhythm: Normal rate and regular rhythm.     Heart sounds: Normal heart sounds.  Pulmonary:     Effort: No respiratory distress.     Breath sounds: No stridor. No wheezing.  Abdominal:     General: Bowel sounds are normal. There is no distension.     Palpations: Abdomen is soft. There is no mass.     Tenderness: There is no abdominal tenderness. There is no guarding or rebound.  Musculoskeletal:        General: No tenderness.     Cervical back: Normal range of motion and neck supple. No rigidity.     Right lower leg: No edema.     Left lower leg: No edema.  Lymphadenopathy:     Cervical: No cervical adenopathy.  Skin:    Findings: No erythema or rash.  Neurological:     Cranial Nerves: No cranial nerve deficit.     Motor: No abnormal muscle tone.     Coordination: Coordination normal.     Deep Tendon Reflexes: Reflexes normal.  Psychiatric:        Behavior: Behavior normal.        Thought Content: Thought content normal.        Judgment: Judgment normal.      A total time of 45 minutes was spent preparing to see the patient, reviewing tests, x-rays, operative reports and other medical records.  Also, obtaining history and performing comprehensive physical exam.  Additionally, counseling the patient regarding the above listed issues -- PAD, anxiety, CAD.   Finally, documenting clinical information in the health records, coordination of care, educating the patient.    Lab Results  Component Value Date   WBC 8.1 12/12/2022   HGB 13.3 12/12/2022   HCT 39.7 12/12/2022   PLT 186.0 12/12/2022   GLUCOSE 95 12/12/2022   CHOL 201 (H) 07/12/2022   TRIG 132.0 07/12/2022   HDL 47.60 07/12/2022   LDLDIRECT 137.0 06/14/2021   LDLCALC 127 (H) 07/12/2022   ALT 16 12/12/2022   AST 21 12/12/2022   NA 137 12/12/2022   K  3.9 12/12/2022   CL 102 12/12/2022   CREATININE 0.79 12/12/2022   BUN 10 12/12/2022   CO2 28 12/12/2022   TSH 0.57 12/12/2022   HGBA1C 5.7 09/15/2020    CT Chest Wo Contrast  Result Date:  02/03/2020 CLINICAL DATA:  Lung nodule follow-up. EXAM: CT CHEST WITHOUT CONTRAST TECHNIQUE: Multidetector CT imaging of the chest was performed following the standard protocol without IV contrast. COMPARISON:  CT chest dated June 27, 2019. FINDINGS: Cardiovascular: No significant vascular findings. Normal heart size. No pericardial effusion. No thoracic aortic aneurysm. Coronary, aortic arch, and branch vessel atherosclerotic vascular disease. Mediastinum/Nodes: No enlarged mediastinal or axillary lymph nodes. Thyroid gland, trachea, and esophagus demonstrate no significant findings. Lungs/Pleura: Unchanged slightly irregular 7 mm nodule in the superior segment of the right lower lobe (series 3, image 76), stable since October 2020. 4 mm ground-glass nodule in the right upper lobe (series 3, image 39) is also unchanged since the prior study. No new pulmonary nodule. No focal consolidation, pleural effusion, or pneumothorax. Unchanged mild biapical pleuroparenchymal scarring. Upper Abdomen: No acute abnormality. Musculoskeletal: No acute or significant osseous findings. Unchanged small mixed sclerotic and lucent lesion in the sternal body, stable since October 2020, benign. IMPRESSION: 1. Unchanged 7 mm nodule in the superior segment of the right lower lobe. No further follow-up required. 2. Additional 4 mm ground-glass nodule in the right upper lobe is also unchanged since October 2020. No further follow-up recommended. 3. Aortic Atherosclerosis (ICD10-I70.0). Electronically Signed   By: Obie Dredge M.D.   On: 02/03/2020 16:59    Assessment & Plan:   Problem List Items Addressed This Visit     Dyslipidemia    On Red rice yeast. Statin intolerant Check lipids        Relevant Orders   CBC with  Differential/Platelet (Completed)   Comprehensive metabolic panel (Completed)   TSH (Completed)   Essential hypertension    Cont w/Atenolol Rx      Relevant Orders   CBC with Differential/Platelet (Completed)   Comprehensive metabolic panel (Completed)   TSH (Completed)   Vitamin B12 (Completed)   Urinalysis   Carotid artery stenosis without cerebral infarction - Primary    Will obtain a follow up study  On Red rice yeast. Statin intolerant. On ASA Check lipids      Relevant Orders   US Carotid Bilateral   CBC with Differential/Platelet (Completed)   Comprehensive metabolic panel (Completed)   TSH (Completed)   Generalized anxiety disorder    Cont on Prozac  Xanax rare use  Potential benefits of a long term opioids use as well as potential risks (i.e. addiction risk, apnea etc) and complications (i.e. Somnolence, constipation and others) were explained to the patient and were aknowledged.       Relevant Medications   ALPRAZolam (XANAX) 0.25 MG tablet   Coronary atherosclerosis    2020 Coronary calcium CT score of 122.  On fish oil or krill oil, red rice yeast  Statin intolerant       B12 deficiency    On B12 inj q 2 wks      Relevant Orders   Vitamin B12 (Completed)   Aortic atherosclerosis (HCC)    2020 Coronary calcium CT score of 122.  On fish oil or krill oil, red rice yeast  Statin intolerant      Anxiety    Xanax low dose  Potential benefits of a long term benzodiazepines  use as well as potential risks  and complications were explained to the patient and were aknowledged. On Prozac      Relevant Medications   ALPRAZolam (XANAX) 0.25 MG tablet      Meds ordered this encounter  Medications   ALPRAZolam (XANAX) 0.25 MG tablet  Sig: Take 1 tablet (0.25 mg total) by mouth 2 (two) times daily as needed for anxiety.    Dispense:  60 tablet    Refill:  2      Follow-up: Return in about 3 months (around 03/14/2023) for a follow-up visit.  Sonda Primes, MD

## 2022-12-12 NOTE — Assessment & Plan Note (Signed)
Xanax low dose  Potential benefits of a long term benzodiazepines  use as well as potential risks  and complications were explained to the patient and were aknowledged. On Prozac

## 2022-12-12 NOTE — Assessment & Plan Note (Addendum)
Will obtain a follow up study  On Red rice yeast. Statin intolerant. On ASA Check lipids

## 2022-12-12 NOTE — Assessment & Plan Note (Addendum)
On Red rice yeast. Statin intolerant Check lipids

## 2022-12-13 LAB — COMPREHENSIVE METABOLIC PANEL
ALT: 16 U/L (ref 0–35)
AST: 21 U/L (ref 0–37)
Albumin: 4.3 g/dL (ref 3.5–5.2)
Alkaline Phosphatase: 44 U/L (ref 39–117)
BUN: 10 mg/dL (ref 6–23)
CO2: 28 meq/L (ref 19–32)
Calcium: 9.1 mg/dL (ref 8.4–10.5)
Chloride: 102 meq/L (ref 96–112)
Creatinine, Ser: 0.79 mg/dL (ref 0.40–1.20)
GFR: 69.84 mL/min (ref >60.00)
Glucose, Bld: 95 mg/dL (ref 70–99)
Potassium: 3.9 meq/L (ref 3.5–5.1)
Sodium: 137 meq/L (ref 135–145)
Total Bilirubin: 0.6 mg/dL (ref 0.2–1.2)
Total Protein: 6.4 g/dL (ref 6.0–8.3)

## 2022-12-21 DIAGNOSIS — M722 Plantar fascial fibromatosis: Secondary | ICD-10-CM | POA: Diagnosis not present

## 2022-12-21 DIAGNOSIS — M6702 Short Achilles tendon (acquired), left ankle: Secondary | ICD-10-CM | POA: Diagnosis not present

## 2023-01-03 DIAGNOSIS — L820 Inflamed seborrheic keratosis: Secondary | ICD-10-CM | POA: Diagnosis not present

## 2023-01-09 ENCOUNTER — Other Ambulatory Visit: Payer: Self-pay | Admitting: Internal Medicine

## 2023-02-19 ENCOUNTER — Ambulatory Visit: Payer: Medicare Other | Admitting: Internal Medicine

## 2023-02-19 VITALS — BP 126/70 | HR 68 | Ht 65.0 in | Wt 154.2 lb

## 2023-02-19 DIAGNOSIS — Z85850 Personal history of malignant neoplasm of thyroid: Secondary | ICD-10-CM | POA: Diagnosis not present

## 2023-02-19 DIAGNOSIS — E890 Postprocedural hypothyroidism: Secondary | ICD-10-CM | POA: Diagnosis not present

## 2023-02-19 NOTE — Progress Notes (Unsigned)
Patient ID: Lauren Mcgee, female   DOB: Apr 05, 1940, 82 y.o.   MRN: [id number]  HPI  Lauren Mcgee is a 82 y.o.-year-old female, returning for f/u for follicular and papillary thyroid cancer, in remission, and postsurgical hypothyroidism. Last visit 1 year ago.  Interim history: She feels well, w/o complaints today. At last visit she had heat intolerance but this resolved.  Also, she previously had some dysphagia, but not anymore.  Reviewed and addended history: Thyroid cancer  - dx'ed in 2010.  Thyroidectomy  on 12/07/2009 (Dr Duanne Guess) for a thyroid tumor >> ThyCA: Minimally invasive follicular carcinoma of the right thyroid lobe, measuring 2.7 cm, and multifocal papillary microcarcinoma bilaterally. No nodes sampled. One of the papillary tumors was approaching and may have involved the surgical resection margin. There was minimal extra thyroid though invasion of one of the papillary microcarcinoma's, but no lymphovascular invasion.  RAI tx with 154 mCi I-131 on 03/16/2010. At that time TSH was >100, thyroglobulin was 0.7, ATA<20  Post treatment WBS did not show metastatic disease. There were foci of activity which were previously seen on the pretherapy scan. No new lesions identified.  Neck U/S 12/09/2012: Post thyroidectomy. No evidence of residual thyroid tissue or mass/nodule at the thyroid bed.  CT chest 06/27/2019: Lungs/Pleura: Again noted is a slightly irregular nodule in the superior segment of the right lower lobe with irregular margins. It appears essentially unchanged in size measuring approximately 6.7 mm maximum diameter. There is new posterior pleural thickening in the right hemithorax best seen on image 59 of series 6. There is a new faint ground-glass 4 mm nodule in the right upper lobe seen on image 58 of series 6 and image 33 of series 3. The lungs are otherwise clear.  CT chest 02/03/2020:  1. Unchanged 7 mm nodule in the superior segment of the right  lower lobe. No further follow-up required. 2. Additional 4 mm ground-glass nodule in the right upper lobe is also unchanged since October 2020. No further follow-up recommended.  Reviewed her thyroglobulin and ATA levels: Lab Results  Component Value Date   THYROGLB <0.1 (L) 04/12/2022   THYROGLB <0.1 (L) 03/03/2021   THYROGLB 0.1 (L) 12/16/2019   THYROGLB <0.1 (L) 01/23/2019   THYROGLB 0.1 (L) 01/11/2018   THYROGLB 0.1 (L) 12/06/2016   THYROGLB <0.1 (L) 10/12/2015   THYROGLB <0.1 (L) 10/12/2014   THYROGLB <0.2 06/24/2013   THYROGLB <0.2 12/02/2012   THGAB <1 04/12/2022   THGAB <1 03/03/2021   THGAB <1 12/16/2019   THGAB <1 01/23/2019   THGAB <1 01/11/2018   THGAB <1 12/06/2016   THGAB <1 10/12/2015   THGAB <1 10/12/2014   THGAB <20.0 06/24/2013   THGAB <20.0 12/02/2012   Postsurgical hypothyroidism:  At last visit she was on Synthroid 100 mcg (increased from 88 mcg 02/2021), but we had to decrease the dose due to suppressed TSH.  She is now back on 88 mcg daily: - in am - fasting - 30 min  from b'fast - no Ca, Fe, MVI - off and on PPIs - later in the day - not on Biotin  Reviewed her TFTs: Lab Results  Component Value Date   TSH 0.57 12/12/2022   TSH 0.84 04/12/2022   TSH 0.16 (L) 02/20/2022   TSH 0.45 06/14/2021   TSH 1.26 04/11/2021   TSH 7.97 (H) 03/03/2021   TSH 5.62 (H) 09/15/2020   TSH 11.76 (H) 03/30/2020   TSH 5.42 (H) 12/16/2019   TSH  1.83 06/12/2019   FREET4 1.15 04/12/2022   FREET4 1.09 04/11/2021   FREET4 1.01 03/03/2021   FREET4 0.91 04/01/2020   FREET4 0.70 12/16/2019   FREET4 0.75 06/12/2019   FREET4 0.94 04/15/2019   FREET4 1.08 03/12/2019   FREET4 1.09 01/23/2019   FREET4 1.05 12/06/2018   Pt denies: - feeling nodules in neck - hoarseness - choking - SOB with lying down She had chronic dysphagia, which started before her surgery >> resolved.  She continues atenolol per cardiology.  She also has a history of HTN, HL (Crestor >>  fatigue), PVD, GERD.   ROS: + resolved subjective hyperthermia + see HPI  I reviewed pt's medications, allergies, PMH, social hx, family hx, and changes were documented in the history of present illness. Otherwise, unchanged from my initial visit note.  Past Medical History:  Diagnosis Date   Diverticulosis of colon (without mention of hemorrhage)    GERD (gastroesophageal reflux disease)    Hx of colonic polyp 2001   TUBULAR ADENOMA   Hyperlipidemia    Hypertension    Hypothyroidism    IBS (irritable bowel syndrome)    PVD (peripheral vascular disease) (HCC)    Thyroid cancer (HCC) 2011   Past Surgical History:  Procedure Laterality Date   ABDOMINAL HYSTERECTOMY  1991   COLONOSCOPY  2010   562.10   ESOPHAGOGASTRODUODENOSCOPY     multiple   THYROIDECTOMY  2010   and 131I treatment at [facility]- Dr. Lady Gary   Social History   Socioeconomic History   Marital status: Married    Spouse name: Not on file   Number of children: 2   Years of education: Not on file   Highest education level: Not on file  Occupational History   Occupation: retired  Tobacco Use   Smoking status: Former    Current packs/day: 0.00    Types: Cigarettes    Quit date: 03/06/1958    Years since quitting: 65.0   Smokeless tobacco: Never  Vaping Use   Vaping status: Never Used  Substance and Sexual Activity   Alcohol use: No   Drug use: No   Sexual activity: Yes  Other Topics Concern   Not on file  Social History Narrative   Retired   Married 2 kids   Regular exercise: not outside the home    Caffeine use: coffee daily   Former smoker, no alcohol, no drugs   Social Drivers of Corporate investment banker Strain: Low Risk  (08/03/2022)   Overall Financial Resource Strain (CARDIA)    Difficulty of Paying Living Expenses: Not hard at all  Food Insecurity: No Food Insecurity (08/03/2022)   Hunger Vital Sign    Worried About Running Out of Food in the Last Year: Never true    Ran Out of Food in  the Last Year: Never true  Transportation Needs: No Transportation Needs (08/03/2022)   PRAPARE - Administrator, Civil Service (Medical): No    Lack of Transportation (Non-Medical): No  Physical Activity: Sufficiently Active (08/03/2022)   Exercise Vital Sign    Days of Exercise per Week: 5 days    Minutes of Exercise per Session: 30 min  Stress: No Stress Concern Present (08/03/2022)   [employer] of Occupational Health - Occupational Stress Questionnaire    Feeling of Stress : Not at all  Social Connections: Socially Integrated (08/03/2022)   Social Connection and Isolation Panel [NHANES]    Frequency of Communication with Friends and Family: More  than three times a week    Frequency of Social Gatherings with Friends and Family: Twice a week    Attends Religious Services: More than 4 times per year    Active Member of [employer] or Organizations: Yes    Attends Engineer, structural: More than 4 times per year    Marital Status: Married  Catering manager Violence: Not At Risk (08/03/2022)   Humiliation, Afraid, Rape, and Kick questionnaire    Fear of Current or Ex-Partner: No    Emotionally Abused: No    Physically Abused: No    Sexually Abused: No   Current Outpatient Medications on File Prior to Visit  Medication Sig Dispense Refill   ALPRAZolam (XANAX) 0.25 MG tablet Take 1 tablet (0.25 mg total) by mouth 2 (two) times daily as needed for anxiety. 60 tablet 2   aspirin 81 MG chewable tablet Chew 81 mg by mouth daily.     atenolol (TENORMIN) 25 MG tablet TAKE 1 TABLET BY MOUTH EVERY DAY 90 tablet 3   B-D 3CC LUER-LOK SYR 25GX1" 25G X 1" 3 ML MISC USE AS DIRECTED FOR B12 INJECTION ANNUAL APPT DUE IN MUST SEE PROVIDER FOR FUTURE REFILLS 8 each 0   carvedilol (COREG) 6.25 MG tablet Take 6.25 mg by mouth 2 (two) times daily with a meal.     cholecalciferol (VITAMIN D) 1000 UNITS tablet Take 1,000 Units by mouth daily.     cyanocobalamin (VITAMIN B12) 1000 MCG/ML  injection GIVE 1000 MCG OF VITAMIN B12 SUBCUTANEOUSLY DAILY X 1 WEEK, THEN WEEKLY FOR 1 MONTH, THEN ONCE EVERY 2 WEEKS 10 mL 5   DENTA 5000 PLUS 1.1 % CREA dental cream      estradiol (ESTRACE) 0.1 MG/GM vaginal cream Place 1 Applicatorful vaginally at bedtime.     FLUoxetine (PROZAC) 20 MG capsule TAKE 1 CAPSULE BY MOUTH EVERY DAY 90 capsule 3   fluticasone (FLONASE) 50 MCG/ACT nasal spray Place into both nostrils daily.     nitrofurantoin, macrocrystal-monohydrate, (MACROBID) 100 MG capsule Take 100 mg by mouth at bedtime. Take 1 by mouth at bedtime     Omega-3 Fatty Acids (FISH OIL) 1000 MG CAPS 1 po bid 100 capsule 3   omeprazole (PRILOSEC OTC) 20 MG tablet Take 20 mg by mouth daily as needed.     SYNTHROID 100 MCG tablet TAKE 1 TABLET BY MOUTH EVERY DAY BEFORE BREAKFAST 90 tablet 0   SYNTHROID 88 MCG tablet TAKE 1 TABLET BY MOUTH DAILY BEFORE BREAKFAST. 90 tablet 3   TOBRADEX ST 0.3-0.05 % SUSP Place into the right eye.     Turmeric (QC TUMERIC COMPLEX PO) Take 475 mg by mouth daily.     No current facility-administered medications on file prior to visit.   Allergies  Allergen Reactions   Influenza Vaccines     Local reaction   Lexapro [Escitalopram]     Withdrawal sx's, cramps   Pravastatin     Cramps, dizziness   Family History  Problem Relation Age of Onset   Hyperlipidemia Mother    Heart disease Mother    Hypertension Other    Colon cancer Paternal Aunt 22   Colon cancer Son 50   Colon polyps Neg Hx    Liver cancer Neg Hx    Pancreatic cancer Neg Hx    Rectal cancer Neg Hx    Stomach cancer Neg Hx    PE: BP 126/70   Pulse 68   Ht 5\' 5"  (1.651 m)  Wt 154 lb 3.2 oz (69.9 kg)   SpO2 96%   BMI 25.66 kg/m   Wt Readings from Last 3 Encounters:  02/19/23 154 lb 3.2 oz (69.9 kg)  12/12/22 155 lb (70.3 kg)  08/03/22 150 lb (68 kg)   Constitutional: Normal weight, in NAD Eyes:  EOMI, no exophthalmos ENT: no neck masses, no cervical lymphadenopathy Cardiovascular:  RRR, No MRG Respiratory: CTA B Musculoskeletal: no deformities Skin:no rashes Neurological: no tremor with outstretched hands  ASSESSMENT: 1. Hypothyroidism  2. History of thyroid cancer  PLAN:  1. Patient with postsurgical hypothyroidism, on Synthroid d.a.w. - latest thyroid labs reviewed with pt. >> normal: Lab Results  Component Value Date   TSH 0.57 12/12/2022  - she continues on Synthroid 88 mcg daily, dose decreased 02/2023 - pt feels good on this dose, without complaints.  Her heat intolerance improved after reducing Synthroid dose. - we discussed about taking the thyroid hormone every day, with water, >30 minutes before breakfast, separated by >4 hours from acid reflux medications, calcium, iron, multivitamins. Pt. is taking it correctly. - will check thyroid tests today: TSH and fT4 - If labs are abnormal, she will need to return for repeat TFTs in 1.5 months - OTW, I will see her back in a year  2. PTC -In remission -Reviewed records available from Dr. Dr. Lady Gary -Reviewed the chest CT scan reports from 2011-2021: she has lung nodules that are stable -Her thyroglobulin level is fluctuating between 0.1 and undetectable.  We discussed that this is most likely due to acid variability.  Latest levels were undetectable.  Antithyroglobulin antibodies were also undetectable. -We will recheck these today -No further ultrasounds needed unless thyroglobulin starts to increase or masses are felt on palpation of her neck -I will see her back in 1 year  No orders of the defined types were placed in this encounter.  Carlus Pavlov, MD PhD [facility]

## 2023-02-19 NOTE — Patient Instructions (Addendum)
Please continue Synthroid 88 mcg daily.  Take the thyroid hormone every day, with water, at least 30 minutes before breakfast, separated by at least 4 hours from: - acid reflux medications - calcium - iron - multivitamins  Please stop at the lab.  Please come back for a follow-up appointment in 1 year.

## 2023-02-20 LAB — TSH: TSH: 2.42 m[IU]/L (ref 0.40–4.50)

## 2023-02-20 LAB — THYROGLOBULIN LEVEL: Thyroglobulin: <0.1 ng/mL — ABNORMAL LOW

## 2023-02-20 LAB — THYROGLOBULIN ANTIBODY: Thyroglobulin Ab: <1 [IU]/mL (ref <=1)

## 2023-02-21 ENCOUNTER — Encounter: Payer: Self-pay | Admitting: Internal Medicine

## 2023-03-15 DIAGNOSIS — H903 Sensorineural hearing loss, bilateral: Secondary | ICD-10-CM | POA: Diagnosis not present

## 2023-03-28 ENCOUNTER — Telehealth: Payer: Self-pay | Admitting: Internal Medicine

## 2023-04-04 ENCOUNTER — Other Ambulatory Visit: Payer: Self-pay

## 2023-04-04 MED ORDER — CYANOCOBALAMIN 1000 MCG/ML IJ SOLN: INTRAMUSCULAR | 5 refills | Status: AC

## 2023-04-04 NOTE — Telephone Encounter (Signed)
Copied from CRM [id number]. Topic: Clinical - Prescription Issue >> Apr 04, 2023  2:31 PM Taleah C wrote: Reason for CRM: pt called and stated that she is still needing her "b12 medicine bottle". A request for a refill was sent on 01/22. She is unsure of the name of the medication. Pt is using the [facility] in [place]. Please call and advise.

## 2023-04-17 DIAGNOSIS — H903 Sensorineural hearing loss, bilateral: Secondary | ICD-10-CM | POA: Diagnosis not present

## 2023-04-17 DIAGNOSIS — H608X3 Other otitis externa, bilateral: Secondary | ICD-10-CM | POA: Diagnosis not present

## 2023-04-26 ENCOUNTER — Ambulatory Visit: Payer: Medicare Other

## 2023-04-26 VITALS — Ht 65.0 in | Wt 150.0 lb

## 2023-04-26 DIAGNOSIS — Z1231 Encounter for screening mammogram for malignant neoplasm of breast: Secondary | ICD-10-CM | POA: Diagnosis not present

## 2023-04-26 DIAGNOSIS — Z0000 Encounter for general adult medical examination without abnormal findings: Secondary | ICD-10-CM

## 2023-04-26 DIAGNOSIS — Z780 Asymptomatic menopausal state: Secondary | ICD-10-CM

## 2023-04-26 NOTE — Progress Notes (Cosign Needed Addendum)
 Subjective:   Lauren Mcgee is a 83 y.o. female who presents for Medicare Annual (Subsequent) preventive examination.  Visit Complete: Virtual I connected with  Lauren Mcgee on 04/26/23 by a audio enabled telemedicine application and verified that I am speaking with the correct person using two identifiers.  Patient Location: Home  Provider Location: Office/Clinic  I discussed the limitations of evaluation and management by telemedicine. The patient expressed understanding and agreed to proceed.  Vital Signs: Because this visit was a virtual/telehealth visit, some criteria may be missing or patient reported. Any vitals not documented were not able to be obtained and vitals that have been documented are patient reported.   Cardiac Risk Factors include: advanced age (>104men, >64 women);hypertension;Other (see comment);dyslipidemia, Risk factor comments: Aortic atherosclerosis     Objective:    Today's Vitals   04/26/23 1251  Weight: 150 lb (68 kg)  Height: 5\' 5"  (1.651 m)   Body mass index is 24.96 kg/m.     04/26/2023    1:14 PM 08/03/2022    3:23 PM 07/25/2021   11:52 AM 07/23/2020   12:57 PM 08/18/2019   11:50 AM 10/04/2016    1:35 PM  Advanced Directives  Does Patient Have a Medical Advance Directive? Yes Yes No Yes No No  Type of Estate agent of [place];Living will Living will;Healthcare Power of [employer] Power of Attorney    Does patient want to make changes to medical advance directive?  No - Patient declined  No - Patient declined    Copy of Healthcare Power of Attorney in Chart? No - copy requested No - copy requested  No - copy requested    Would patient like information on creating a medical advance directive?   No - Patient declined  No - Patient declined     Current Medications (verified) Outpatient Encounter Medications as of 04/26/2023  Medication Sig   ALPRAZolam (XANAX) 0.25 MG tablet Take 1 tablet (0.25 mg total) by mouth 2  (two) times daily as needed for anxiety.   aspirin 81 MG chewable tablet Chew 81 mg by mouth daily.   atenolol (TENORMIN) 25 MG tablet TAKE 1 TABLET BY MOUTH EVERY DAY   B-D 3CC LUER-LOK SYR 25GX1" 25G X 1" 3 ML MISC USE AS DIRECTED FOR B12 INJECTION ANNUAL APPT DUE IN MUST SEE PROVIDER FOR FUTURE REFILLS   carvedilol (COREG) 6.25 MG tablet Take 6.25 mg by mouth 2 (two) times daily with a meal.   cholecalciferol (VITAMIN D) 1000 UNITS tablet Take 1,000 Units by mouth daily.   cyanocobalamin (VITAMIN B12) 1000 MCG/ML injection GIVE 1000 MCG OF VITAMIN B12 SUBCUTANEOUSLY DAILY X 1 WEEK, THEN WEEKLY FOR 1 MONTH, THEN ONCE EVERY 2 WEEKS   DENTA 5000 PLUS 1.1 % CREA dental cream    estradiol (ESTRACE) 0.1 MG/GM vaginal cream Place 1 Applicatorful vaginally at bedtime.   FLUoxetine (PROZAC) 20 MG capsule TAKE 1 CAPSULE BY MOUTH EVERY DAY   fluticasone (FLONASE) 50 MCG/ACT nasal spray Place into both nostrils daily.   nitrofurantoin, macrocrystal-monohydrate, (MACROBID) 100 MG capsule Take 100 mg by mouth at bedtime. Take 1 by mouth at bedtime   Omega-3 Fatty Acids (FISH OIL) 1000 MG CAPS 1 po bid   omeprazole (PRILOSEC OTC) 20 MG tablet Take 20 mg by mouth daily as needed.   SYNTHROID 88 MCG tablet TAKE 1 TABLET BY MOUTH DAILY BEFORE BREAKFAST.   TOBRADEX ST 0.3-0.05 % SUSP Place into the right eye.  Turmeric (QC TUMERIC COMPLEX PO) Take 475 mg by mouth daily.   No facility-administered encounter medications on file as of 04/26/2023.    Allergies (verified) Influenza vaccines, Lexapro [escitalopram], and Pravastatin   History: Past Medical History:  Diagnosis Date   Diverticulosis of colon (without mention of hemorrhage)    GERD (gastroesophageal reflux disease)    Hx of colonic polyp 2001   TUBULAR ADENOMA   Hyperlipidemia    Hypertension    Hypothyroidism    IBS (irritable bowel syndrome)    PVD (peripheral vascular disease) (HCC)    Thyroid cancer (HCC) 2011   Past Surgical  History:  Procedure Laterality Date   ABDOMINAL HYSTERECTOMY  1991   COLONOSCOPY  2010   562.10   ESOPHAGOGASTRODUODENOSCOPY     multiple   THYROIDECTOMY  2010   and 131I treatment at [facility]- Dr. Lady Gary   Family History  Problem Relation Age of Onset   Hyperlipidemia Mother    Heart disease Mother    Hypertension Other    Colon cancer Paternal Aunt 75   Colon cancer Son 4   Colon polyps Neg Hx    Liver cancer Neg Hx    Pancreatic cancer Neg Hx    Rectal cancer Neg Hx    Stomach cancer Neg Hx    Social History   Socioeconomic History   Marital status: Married    Spouse name: Lauren Mcgee   Number of children: 2   Years of education: Not on file   Highest education level: Not on file  Occupational History   Occupation: retired  Tobacco Use   Smoking status: Former    Current packs/day: 0.00    Types: Cigarettes    Quit date: 03/06/1958    Years since quitting: 65.1   Smokeless tobacco: Never  Vaping Use   Vaping status: Never Used  Substance and Sexual Activity   Alcohol use: No   Drug use: No   Sexual activity: Yes  Other Topics Concern   Not on file  Social History Narrative   Retired   Married 2 kids   Regular exercise: not outside the home    Caffeine use: coffee daily   Former smoker, no alcohol, no drugs      Lives with husband-2025   Social Drivers of Health   Financial Resource Strain: Low Risk  (04/26/2023)   Overall Financial Resource Strain (CARDIA)    Difficulty of Paying Living Expenses: Not very hard  Food Insecurity: No Food Insecurity (04/26/2023)   Hunger Vital Sign    Worried About Running Out of Food in the Last Year: Never true    Ran Out of Food in the Last Year: Never true  Transportation Needs: No Transportation Needs (04/26/2023)   PRAPARE - Administrator, Civil Service (Medical): No    Lack of Transportation (Non-Medical): No  Physical Activity: Inactive (04/26/2023)   Exercise Vital Sign    Days of Exercise per Week: 0  days    Minutes of Exercise per Session: 0 min  Stress: No Stress Concern Present (04/26/2023)   [employer] of Occupational Health - Occupational Stress Questionnaire    Feeling of Stress : Not at all  Social Connections: Socially Integrated (04/26/2023)   Social Connection and Isolation Panel [NHANES]    Frequency of Communication with Friends and Family: More than three times a week    Frequency of Social Gatherings with Friends and Family: More than three times a week    Attends  Religious Services: More than 4 times per year    Active Member of Clubs or Organizations: Yes    Attends Banker Meetings: Never    Marital Status: Married    Tobacco Counseling Counseling given: Not Answered   Clinical Intake:  Pre-visit preparation completed: Yes  Pain : No/denies pain     BMI - recorded: 24.96 Nutritional Status: BMI of 19-24  Normal Nutritional Risks: None Diabetes: No     Interpreter Needed?: No  Information entered by :: Tanita Palinkas, RMA   Activities of Daily Living    04/26/2023   12:52 PM 08/03/2022    3:23 PM  In your present state of health, do you have any difficulty performing the following activities:  Hearing? 1 0  Comment wears hearing aides   Vision? 0 0  Difficulty concentrating or making decisions? 0 0  Walking or climbing stairs? 0 0  Dressing or bathing? 0 0  Doing errands, shopping? 0 0  Preparing Food and eating ? N N  Using the Toilet? N N  In the past six months, have you accidently leaked urine? N N  Do you have problems with loss of bowel control? N N  Managing your Medications? N N  Managing your Finances? N N  Housekeeping or managing your Housekeeping? N N    Patient Care Team: Plotnikov, Georgina Quint, MD as PCP - General Branch, Alben Spittle, MD as PCP - Cardiology (Cardiology) Carlus Pavlov, MD as Consulting Physician (Internal Medicine) Mia Creek, MD as Consulting Physician (Ophthalmology) Marcelle Overlie,  MD as Consulting Physician (Obstetrics and Gynecology) Albin Felling, OD as Consulting Physician (Optometry)  Indicate any recent Medical Services you may have received from other than [facility] providers in the past year (date may be approximate).     Assessment:   This is a routine wellness examination for Mumtaz.  Hearing/Vision screen Hearing Screening - Comments:: Wears hearing aides Vision Screening - Comments:: Wears eyeglasses   Goals Addressed               This Visit's Progress     Patient Stated (pt-stated)        Drink more water.      Depression Screen    04/26/2023    1:21 PM 12/12/2022    3:03 PM 08/03/2022    3:21 PM 07/03/2022    3:14 PM 07/25/2021   11:45 AM 07/23/2020   12:57 PM 04/07/2020   10:27 AM  PHQ 2/9 Scores  PHQ - 2 Score 1 0 0 0 0 0 0  PHQ- 9 Score 1          Fall Risk    04/26/2023    1:15 PM 12/12/2022    3:03 PM 08/03/2022    3:23 PM 07/03/2022    3:14 PM 07/25/2021   11:52 AM  Fall Risk   Falls in the past year? 0 0 0 0 0  Number falls in past yr: 0 0 0 0 0  Injury with Fall? 0 0 0 0 0  Risk for fall due to : No Fall Risks No Fall Risks No Fall Risks No Fall Risks No Fall Risks  Follow up Falls prevention discussed;Falls evaluation completed Falls evaluation completed Falls prevention discussed;Education provided;Falls evaluation completed Falls evaluation completed Falls evaluation completed    MEDICARE RISK AT HOME: Medicare Risk at Home Any stairs in or around the home?: Yes If so, are there any without handrails?: Yes Home free of loose throw  rugs in walkways, pet beds, electrical cords, etc?: Yes Adequate lighting in your home to reduce risk of falls?: Yes Life alert?: No Use of a cane, walker or w/c?: No Grab bars in the bathroom?: Yes Shower chair or bench in shower?: No Elevated toilet seat or a handicapped toilet?: No  TIMED UP AND GO:  Was the test performed?  No    Cognitive Function:        04/26/2023   12:53 PM  08/03/2022    3:23 PM 07/25/2021   11:54 AM  6CIT Screen  What Year? 0 points 0 points 0 points  What month? 0 points 0 points 0 points  What time? 0 points 0 points 0 points  Count back from 20 0 points 0 points 0 points  Months in reverse 0 points 0 points 0 points  Repeat phrase 0 points 0 points 0 points  Total Score 0 points 0 points 0 points    Immunizations Immunization History  Administered Date(s) Administered   Fluad Quad(high Dose 65+) 11/19/2018, 12/16/2019, 12/14/2020, 01/11/2022   Influenza Whole 11/26/2007   Influenza, High Dose Seasonal PF 12/06/2017   Influenza,inj,Quad PF,6+ Mos 12/06/2016   Moderna SARS-COV2 Booster Vaccination 01/02/2020   Moderna Sars-Covid-2 Vaccination 03/21/2019, 04/18/2019   PNEUMOCOCCAL CONJUGATE-20 09/15/2020   Pneumococcal Conjugate-13 10/08/2013   Pneumococcal Polysaccharide-23 11/24/2008   Zoster, Live 11/10/2010    TDAP status: Due, Education has been provided regarding the importance of this vaccine. Advised may receive this vaccine at local pharmacy or Health Dept. Aware to provide a copy of the vaccination record if obtained from local pharmacy or Health Dept. Verbalized acceptance and understanding.  Flu Vaccine status: Due, Education has been provided regarding the importance of this vaccine. Advised may receive this vaccine at local pharmacy or Health Dept. Aware to provide a copy of the vaccination record if obtained from local pharmacy or Health Dept. Verbalized acceptance and understanding.  Pneumococcal vaccine status: Up to date  Covid-19 vaccine status: Declined, Education has been provided regarding the importance of this vaccine but patient still declined. Advised may receive this vaccine at local pharmacy or Health Dept.or vaccine clinic. Aware to provide a copy of the vaccination record if obtained from local pharmacy or Health Dept. Verbalized acceptance and understanding.  Qualifies for Shingles Vaccine? Yes    Zostavax completed Yes   Shingrix Completed?: No.    Education has been provided regarding the importance of this vaccine. Patient has been advised to call insurance company to determine out of pocket expense if they have not yet received this vaccine. Advised may also receive vaccine at local pharmacy or Health Dept. Verbalized acceptance and understanding.  Screening Tests Health Maintenance  Topic Date Due   DTaP/Tdap/Td (1 - Tdap) Never done   DEXA SCAN  Never done   COVID-19 Vaccine (5 - 2024-25 season) 11/05/2022   INFLUENZA VACCINE  06/04/2023 (Originally 10/05/2022)   Medicare Annual Wellness (AWV)  04/25/2024   Pneumonia Vaccine 58+ Years old  Completed   HPV VACCINES  Aged Out   Zoster Vaccines- Shingrix  Discontinued    Health Maintenance  Health Maintenance Due  Topic Date Due   DTaP/Tdap/Td (1 - Tdap) Never done   DEXA SCAN  Never done   COVID-19 Vaccine (5 - 2024-25 season) 11/05/2022    Colorectal cancer screening: No longer required.   Mammogram status: Ordered 04/26/2023. Pt provided with contact info and advised to call to schedule appt.   Bone Density status: Ordered 04/26/2023.  Pt provided with contact info and advised to call to schedule appt.  Lung Cancer Screening: (Low Dose CT Chest recommended if Age 34-80 years, 20 pack-year currently smoking OR have quit w/in 15years.) does not qualify.   Lung Cancer Screening Referral: N/A  Additional Screening:  Hepatitis C Screening: does not qualify;   Vision Screening: Recommended annual ophthalmology exams for early detection of glaucoma and other disorders of the eye. Is the patient up to date with their annual eye exam?  Yes  Who is the provider or what is the name of the office in which the patient attends annual eye exams? [facility] If pt is not established with a provider, would they like to be referred to a provider to establish care? No .   Dental Screening: Recommended annual dental exams  for proper oral hygiene   Community Resource Referral / Chronic Care Management: CRR required this visit?  No   CCM required this visit?  No     Plan:     I have personally reviewed and noted the following in the patient's chart:   Medical and social history Use of alcohol, tobacco or illicit drugs  Current medications and supplements including opioid prescriptions. Patient is not currently taking opioid prescriptions. Functional ability and status Nutritional status Physical activity Advanced directives List of other physicians Hospitalizations, surgeries, and ER visits in previous 12 months Vitals Screenings to include cognitive, depression, and falls Referrals and appointments  In addition, I have reviewed and discussed with patient certain preventive protocols, quality metrics, and best practice recommendations. A written personalized care plan for preventive services as well as general preventive health recommendations were provided to patient.     Shields Pautz L Waqas Bruhl, CMA   04/26/2023   After Visit Summary: (Mail) Due to this being a telephonic visit, the after visit summary with patients personalized plan was offered to patient via mail   Nurse Notes: Patient is due for a Tdap and Flu vaccine.  She is also due for a mammogram and a DEXA, which orders have been placed today.  Patient had no concerns to address today.    Medical screening examination/treatment/procedure(s) were performed by non-physician practitioner and as supervising physician I was immediately available for consultation/collaboration.  I agree with above. Jacinta Shoe, MD

## 2023-04-26 NOTE — Patient Instructions (Signed)
Lauren Mcgee , Thank you for taking time to come for your Medicare Wellness Visit. I appreciate your ongoing commitment to your health goals. Please review the following plan we discussed and let me know if I can assist you in the future.   Referrals/Orders/Follow-Ups/Clinician Recommendations: It was nice talking with you today.  Each day, aim for 6 glasses of water, plenty of protein in your diet and try to get up and walk/ stretch every hour for 5-10 minutes at a time.  You are due for a Flu vaccine, which you can get at the pharmacy.  You have an order for:   [x]   3D Mammogram  [x]   Bone Density     Please call for appointment:  [facility] [address] [place], [place] [address] [phone]  Make sure to wear two-piece clothing.  No lotions, powders, or deodorants the day of the appointment. Make sure to bring picture ID and insurance card.  Bring list of medications you are currently taking including any supplements.   Schedule your [facility] screening mammogram through MyChart!   Log into your MyChart account.  Go to 'Visit' (or 'Appointments' if on mobile App) --> Schedule an Appointment  Under 'Select a Reason for Visit' choose the Mammogram Screening option.  Complete the pre-visit questions and select the time and place that best fits your schedule.    This is a list of the screening recommended for you and due dates:  Health Maintenance  Topic Date Due   DTaP/Tdap/Td vaccine (1 - Tdap) Never done   DEXA scan (bone density measurement)  Never done   COVID-19 Vaccine (5 - 2024-25 season) 11/05/2022   Flu Shot  06/04/2023*   Medicare Annual Wellness Visit  04/25/2024   Pneumonia Vaccine  Completed   HPV Vaccine  Aged Out   Zoster (Shingles) Vaccine  Discontinued  *Topic was postponed. The date shown is not the original due date.    Advanced directives: (Copy Requested) Please bring a copy of your health care power of attorney and living  will to the office to be added to your chart at your convenience.  Next Medicare Annual Wellness Visit scheduled for next year: Yes

## 2023-05-17 DIAGNOSIS — L820 Inflamed seborrheic keratosis: Secondary | ICD-10-CM | POA: Diagnosis not present

## 2023-05-26 ENCOUNTER — Other Ambulatory Visit: Payer: Self-pay | Admitting: Internal Medicine

## 2023-07-02 ENCOUNTER — Encounter: Payer: Self-pay | Admitting: Cardiovascular Disease

## 2023-07-02 ENCOUNTER — Ambulatory Visit: Attending: Cardiovascular Disease | Admitting: Cardiovascular Disease

## 2023-07-02 VITALS — BP 130/66 | HR 62 | Ht 65.0 in | Wt 152.0 lb

## 2023-07-02 DIAGNOSIS — I10 Essential (primary) hypertension: Secondary | ICD-10-CM

## 2023-07-02 DIAGNOSIS — I2510 Atherosclerotic heart disease of native coronary artery without angina pectoris: Secondary | ICD-10-CM

## 2023-07-02 DIAGNOSIS — I6523 Occlusion and stenosis of bilateral carotid arteries: Secondary | ICD-10-CM

## 2023-07-02 NOTE — Addendum Note (Signed)
 Addended by: Sanay Belmar on: 07/02/2023 03:24 PM   Modules accepted: Orders

## 2023-07-02 NOTE — Progress Notes (Signed)
 Chief Complaint  Patient presents with   Follow-up    CAD    History of Present Illness: 83 yo female with history of CAD (abnormal calcium  score), GERD, HLD, HTN and hypothyroidism who is here today for follow up. She has been followed in our office by Dr. Alois Arnt. Coronary calcium  score of 122 in October 2020. Carotid artery dopplers in November 2020 with moderate bilateral disease. Echo October 2022 with normal LV function and trivial MR.   She is here today for follow up. The patient denies any chest pain, dyspnea, palpitations, lower extremity edema, orthopnea, PND, dizziness, near syncope or syncope.   Primary Care Physician: Genia Kettering, MD   Past Medical History:  Diagnosis Date   Diverticulosis of colon (without mention of hemorrhage)    GERD (gastroesophageal reflux disease)    Hx of colonic polyp 2001   TUBULAR ADENOMA   Hyperlipidemia    Hypertension    Hypothyroidism    IBS (irritable bowel syndrome)    PVD (peripheral vascular disease) (HCC)    Thyroid  cancer (HCC) 2011    Past Surgical History:  Procedure Laterality Date   ABDOMINAL HYSTERECTOMY  1991   COLONOSCOPY  2010   562.10   ESOPHAGOGASTRODUODENOSCOPY     multiple   THYROIDECTOMY  2010   and 131I treatment at [facility]- Dr. Leslye Rast    Current Outpatient Medications  Medication Sig Dispense Refill   ALPRAZolam  (XANAX ) 0.25 MG tablet Take 1 tablet (0.25 mg total) by mouth 2 (two) times daily as needed for anxiety. 60 tablet 2   aspirin 81 MG chewable tablet Chew 81 mg by mouth daily.     atenolol  (TENORMIN ) 25 MG tablet TAKE 1 TABLET BY MOUTH EVERY DAY 90 tablet 3   cholecalciferol (VITAMIN D ) 1000 UNITS tablet Take 1,000 Units by mouth daily.     cyanocobalamin  (VITAMIN B12) 1000 MCG/ML injection GIVE 1000 MCG OF VITAMIN B12 SUBCUTANEOUSLY DAILY X 1 WEEK, THEN WEEKLY FOR 1 MONTH, THEN ONCE EVERY 2 WEEKS 10 mL 5   DENTA 5000 PLUS 1.1 % CREA dental cream      estradiol (ESTRACE) 0.1 MG/GM  vaginal cream Place 1 Applicatorful vaginally at bedtime.     FLUoxetine  (PROZAC ) 20 MG capsule TAKE 1 CAPSULE BY MOUTH EVERY DAY 90 capsule 3   fluticasone (FLONASE) 50 MCG/ACT nasal spray Place into both nostrils daily.     nitrofurantoin, macrocrystal-monohydrate, (MACROBID) 100 MG capsule Take 100 mg by mouth at bedtime. Take 1 by mouth at bedtime     Omega-3 Fatty Acids (FISH OIL ) 1000 MG CAPS 1 po bid 100 capsule 3   omeprazole (PRILOSEC OTC) 20 MG tablet Take 20 mg by mouth daily as needed.     SYNTHROID  88 MCG tablet TAKE 1 TABLET BY MOUTH DAILY BEFORE BREAKFAST. 90 tablet 3   SYRINGE-NEEDLE, DISP, 3 ML (B-D 3CC LUER-LOK SYR 25GX1") 25G X 1" 3 ML MISC USE AS DIRECTED FOR B12 INJECTION ANNUAL APPT DUE IN MUST SEE PROVIDER FOR FUTURE REFILLS 8 each 5   TOBRADEX ST 0.3-0.05 % SUSP Place into the right eye.     triamcinolone  cream (KENALOG ) 0.5 % APPLY A THIN LAYER TO THE AFFECTED PORTION OF YOUR OUTER EAR 1-2 TIMES DAILY PRN ITCHING/FLAKING     Turmeric (QC TUMERIC COMPLEX PO) Take 475 mg by mouth daily.     carvedilol (COREG) 6.25 MG tablet Take 6.25 mg by mouth 2 (two) times daily with a meal. (Patient not taking:  Reported on 07/02/2023)     No current facility-administered medications for this visit.    Allergies  Allergen Reactions   Influenza Vaccines     Local reaction   Lexapro  [Escitalopram ]     Withdrawal sx's, cramps   Pravastatin      Cramps, dizziness    Social History   Socioeconomic History   Marital status: Married    Spouse name: Drexel Gentles   Number of children: 2   Years of education: Not on file   Highest education level: Not on file  Occupational History   Occupation: retired  Tobacco Use   Smoking status: Former    Current packs/day: 0.00    Types: Cigarettes    Quit date: 03/06/1958    Years since quitting: 65.3   Smokeless tobacco: Never  Vaping Use   Vaping status: Never Used  Substance and Sexual Activity   Alcohol use: No   Drug use: No   Sexual  activity: Yes  Other Topics Concern   Not on file  Social History Narrative   Retired   Married 2 kids   Regular exercise: not outside the home    Caffeine use: coffee daily   Former smoker, no alcohol, no drugs      Lives with husband-2025   Social Drivers of Health   Financial Resource Strain: Low Risk  (04/26/2023)   Overall Financial Resource Strain (CARDIA)    Difficulty of Paying Living Expenses: Not very hard  Food Insecurity: No Food Insecurity (04/26/2023)   Hunger Vital Sign    Worried About Running Out of Food in the Last Year: Never true    Ran Out of Food in the Last Year: Never true  Transportation Needs: No Transportation Needs (04/26/2023)   PRAPARE - Administrator, Civil Service (Medical): No    Lack of Transportation (Non-Medical): No  Physical Activity: Inactive (04/26/2023)   Exercise Vital Sign    Days of Exercise per Week: 0 days    Minutes of Exercise per Session: 0 min  Stress: No Stress Concern Present (04/26/2023)   [employer] of Occupational Health - Occupational Stress Questionnaire    Feeling of Stress : Not at all  Social Connections: Socially Integrated (04/26/2023)   Social Connection and Isolation Panel [NHANES]    Frequency of Communication with Friends and Family: More than three times a week    Frequency of Social Gatherings with Friends and Family: More than three times a week    Attends Religious Services: More than 4 times per year    Active Member of [employer] or Organizations: Yes    Attends Banker Meetings: Never    Marital Status: Married  Catering manager Violence: Not At Risk (04/26/2023)   Humiliation, Afraid, Rape, and Kick questionnaire    Fear of Current or Ex-Partner: No    Emotionally Abused: No    Physically Abused: No    Sexually Abused: No    Family History  Problem Relation Age of Onset   Hyperlipidemia Mother    Heart disease Mother    Hypertension Other    Colon cancer Paternal Aunt 60    Colon cancer Son 61   Colon polyps Neg Hx    Liver cancer Neg Hx    Pancreatic cancer Neg Hx    Rectal cancer Neg Hx    Stomach cancer Neg Hx     Review of Systems:  As stated in the HPI and otherwise negative.   BP 130/66  Pulse 62   Ht 5\' 5"  (1.651 m)   Wt 68.9 kg   SpO2 98%   BMI 25.29 kg/m   Physical Examination: General: Well developed, well nourished, NAD  HEENT: OP clear, mucus membranes moist  SKIN: warm, dry. No rashes. Neuro: No focal deficits  Musculoskeletal: Muscle strength 5/5 all ext  Psychiatric: Mood and affect normal  Neck: No JVD, no carotid bruits, no thyromegaly, no lymphadenopathy.  Lungs:Clear bilaterally, no wheezes, rhonci, crackles Cardiovascular: Regular rate and rhythm. No murmurs, gallops or rubs. Abdomen:Soft. Bowel sounds present. Non-tender.  Extremities: No lower extremity edema. Pulses are 2 + in the bilateral DP/PT.  EKG:  EKG is ordered today. The ekg ordered today demonstrates  EKG Interpretation Date/Time:  Monday July 02 2023 14:46:49 EDT Ventricular Rate:  57 PR Interval:  150 QRS Duration:  72 QT Interval:  446 QTC Calculation: 434 R Axis:   -4  Text Interpretation: Sinus bradycardia Confirmed by Antoinette Batman [phone]) on 07/02/2023 2:51:45 PM    Recent Labs: 12/12/2022: ALT 16; BUN 10; Creatinine, Ser 0.79; Hemoglobin 13.3; Platelets 186.0; Potassium 3.9; Sodium 137 02/19/2023: TSH 2.42   Lipid Panel    Component Value Date/Time   CHOL 201 (H) 07/12/2022 1112   TRIG 132.0 07/12/2022 1112   HDL 47.60 07/12/2022 1112   CHOLHDL 4 07/12/2022 1112   VLDL 26.4 07/12/2022 1112   LDLCALC 127 (H) 07/12/2022 1112   LDLDIRECT 137.0 06/14/2021 1146     Wt Readings from Last 3 Encounters:  07/02/23 68.9 kg  04/26/23 68 kg  02/19/23 69.9 kg    Assessment and Plan:   1. CAD without angina: She was found to have CAD with an abnormal coronary calcium  score. She has no chest pain. Continue ASA. She is statin  intolerant. PharmD lipid clinic referral to discuss Repatha.   2. Carotid artery disease: Mild to moderate disease in 2020. Repeat carotid artery dopplers now.   3. HTN: BP is well controlled. NO changes  Labs/ tests ordered today include:   Orders Placed This Encounter  Procedures   AMB Referral to [facility] Pharm-D   EKG 12-Lead   VAS US  CAROTID   Disposition:   F/U with me in 12 months   Signed, Antoinette Batman, MD, [facility] 07/02/2023 3:20 PM    [facility] [address] [place], [place], [place]  [address] Phone: [phone]; Fax: [phone]

## 2023-07-02 NOTE — Patient Instructions (Signed)
 Medication Instructions:  No changes *If you need a refill on your cardiac medications before your next appointment, please call your pharmacy*  Lab Work: None today If you have labs (blood work) drawn today and your tests are completely normal, you will receive your results only by: MyChart Message (if you have MyChart) OR A paper copy in the mail If you have any lab test that is abnormal or we need to change your treatment, we will call you to review the results.  Testing/Procedures: Your physician has requested that you have a carotid duplex. This test is an ultrasound of the carotid arteries in your neck. It looks at blood flow through these arteries that supply the brain with blood. Allow one hour for this exam. There are no restrictions or special instructions.   Follow-Up: At [facility], you and your health needs are our priority.  As part of our continuing mission to provide you with exceptional heart care, our providers are all part of one team.  This team includes your primary Cardiologist (physician) and Advanced Practice Providers or APPs (Physician Assistants and Nurse Practitioners) who all work together to provide you with the care you need, when you need it.  Your next appointment:   12 month(s)  Provider:   Antoinette Batman, MD  We recommend signing up for the patient portal called "MyChart".  Sign up information is provided on this After Visit Summary.  MyChart is used to connect with patients for Virtual Visits (Telemedicine).  Patients are able to view lab/test results, encounter notes, upcoming appointments, etc.  Non-urgent messages can be sent to your provider as well.   To learn more about what you can do with MyChart, go to [email or web address].   You have been referred to our clinical pharmacy team (PharmD) for lipids management

## 2023-07-20 ENCOUNTER — Ambulatory Visit: Payer: Self-pay | Admitting: Cardiovascular Disease

## 2023-07-20 ENCOUNTER — Ambulatory Visit (HOSPITAL_COMMUNITY)
Admission: RE | Admit: 2023-07-20 | Discharge: 2023-07-20 | Disposition: A | Discharge status: Home / Self Care | Source: Ambulatory Visit | Attending: Cardiovascular Disease | Admitting: Cardiovascular Disease

## 2023-07-20 DIAGNOSIS — I6523 Occlusion and stenosis of bilateral carotid arteries: Secondary | ICD-10-CM | POA: Diagnosis not present

## 2023-08-24 ENCOUNTER — Ambulatory Visit: Admitting: Pharmacist

## 2023-08-30 ENCOUNTER — Other Ambulatory Visit: Payer: Self-pay | Admitting: Internal Medicine

## 2023-09-14 ENCOUNTER — Other Ambulatory Visit: Payer: Self-pay | Admitting: Internal Medicine

## 2023-09-24 ENCOUNTER — Ambulatory Visit: Attending: Internal Medicine | Admitting: Pharmacist

## 2023-09-24 DIAGNOSIS — E785 Hyperlipidemia, unspecified: Secondary | ICD-10-CM | POA: Diagnosis not present

## 2023-09-24 DIAGNOSIS — I2583 Coronary atherosclerosis due to lipid rich plaque: Secondary | ICD-10-CM | POA: Diagnosis not present

## 2023-09-24 MED ORDER — ROSUVASTATIN CALCIUM 5 MG PO TABS: 5.0000 mg | ORAL_TABLET | ORAL | 3 refills | Status: DC

## 2023-09-24 NOTE — Patient Instructions (Addendum)
 STOP Red Yeast Rice  Start taking rosuvastatin  5mg  three times a week  Please repeat lab work in 3 months. The lab is on the 1st floor of our building [address] Or any [facility] location Please fast for labs

## 2023-09-24 NOTE — Progress Notes (Signed)
 Patient ID: Lauren Mcgee                 DOB: 1940-04-08                    MRN: [id number]      HPI: Lauren Mcgee is a 83 y.o. female patient referred to lipid clinic by Dr. Verlin. PMH is significant for CAD (abnormal calcium  score), GERD, HLD and hypothyroidism. Coronary calcium  score of 122 in October 2020 (57th percentile). Carotid artery dopplers in November 2020 with moderate bilateral disease.   Patient presents today to lipid clinic accompanied by her husband.  She reports that she has been taking red yeast rice for maybe 10 years.  Denies taking it at the same time as FDA approved statins.  She is also taking cod liver oil which I recommended against.  We reviewed time to benefit data with statins along with her Mesa risk score. MESA risk score 11%.  We discussed the benefits and risks of treating cholesterol.  We did review PCSK9 inhibitors but also reviewed possible statin options.   Reviewed options for lowering LDL cholesterol, including ezetimibe, PCSK-9 inhibitors, bempedoic acid and inclisiran.  Discussed mechanisms of action, dosing, side effects and potential decreases in LDL cholesterol.  Also reviewed cost information and potential options for patient assistance.   Current Medications: Red yeast rice 1200 mg daily Intolerances: pravastatin  40mg  (cramps), rosuvastatin  10mg  daily Risk Factors: Coronary calcium  score of 112, Mesa risk score 11%, age LDL-C goal: Less than 70 ApoB goal: Less than 80  Diet: Not reviewed in detail  Exercise: Not reviewed in detail  Family History: father: stroke 36's Mother: heart attack 73's Both parents lived until the 24s  Social History:  Social History   Socioeconomic History   Marital status: Married    Spouse name: Fairy   Number of children: 2   Years of education: Not on file   Highest education level: Not on file  Occupational History   Occupation: retired  Tobacco Use   Smoking status: Former    Current packs/day:  0.00    Types: Cigarettes    Quit date: 03/06/1958    Years since quitting: 65.5   Smokeless tobacco: Never  Vaping Use   Vaping status: Never Used  Substance and Sexual Activity   Alcohol use: No   Drug use: No   Sexual activity: Yes  Other Topics Concern   Not on file  Social History Narrative   Retired   Married 2 kids   Regular exercise: not outside the home    Caffeine use: coffee daily   Former smoker, no alcohol, no drugs      Lives with husband-2025   Social Drivers of Health   Financial Resource Strain: Low Risk  (04/26/2023)   Overall Financial Resource Strain (CARDIA)    Difficulty of Paying Living Expenses: Not very hard  Food Insecurity: No Food Insecurity (04/26/2023)   Hunger Vital Sign    Worried About Running Out of Food in the Last Year: Never true    Ran Out of Food in the Last Year: Never true  Transportation Needs: No Transportation Needs (04/26/2023)   PRAPARE - Administrator, Civil Service (Medical): No    Lack of Transportation (Non-Medical): No  Physical Activity: Inactive (04/26/2023)   Exercise Vital Sign    Days of Exercise per Week: 0 days    Minutes of Exercise per Session: 0 min  Stress:  No Stress Concern Present (04/26/2023)   [employer] of Occupational Health - Occupational Stress Questionnaire    Feeling of Stress : Not at all  Social Connections: Socially Integrated (04/26/2023)   Social Connection and Isolation Panel    Frequency of Communication with Friends and Family: More than three times a week    Frequency of Social Gatherings with Friends and Family: More than three times a week    Attends Religious Services: More than 4 times per year    Active Member of [employer] or Organizations: Yes    Attends Banker Meetings: Never    Marital Status: Married  Catering manager Violence: Not At Risk (04/26/2023)   Humiliation, Afraid, Rape, and Kick questionnaire    Fear of Current or Ex-Partner: No    Emotionally  Abused: No    Physically Abused: No    Sexually Abused: No     Labs: Lipid Panel     Component Value Date/Time   CHOL 201 (H) 07/12/2022 1112   TRIG 132.0 07/12/2022 1112   HDL 47.60 07/12/2022 1112   CHOLHDL 4 07/12/2022 1112   VLDL 26.4 07/12/2022 1112   LDLCALC 127 (H) 07/12/2022 1112   LDLDIRECT 137.0 06/14/2021 1146    Past Medical History:  Diagnosis Date   Diverticulosis of colon (without mention of hemorrhage)    GERD (gastroesophageal reflux disease)    Hx of colonic polyp 2001   TUBULAR ADENOMA   Hyperlipidemia    Hypertension    Hypothyroidism    IBS (irritable bowel syndrome)    PVD (peripheral vascular disease) (HCC)    Thyroid  cancer (HCC) 2011    Current Outpatient Medications on File Prior to Visit  Medication Sig Dispense Refill   aspirin 81 MG chewable tablet Chew 81 mg by mouth daily.     atenolol  (TENORMIN ) 25 MG tablet TAKE 1 TABLET BY MOUTH EVERY DAY 90 tablet 3   cholecalciferol (VITAMIN D ) 1000 UNITS tablet Take 1,000 Units by mouth daily.     cyanocobalamin  (VITAMIN B12) 1000 MCG/ML injection GIVE 1000 MCG OF VITAMIN B12 SUBCUTANEOUSLY DAILY X 1 WEEK, THEN WEEKLY FOR 1 MONTH, THEN ONCE EVERY 2 WEEKS 10 mL 5   cyanocobalamin  1000 MCG tablet Take 1,000 mcg by mouth daily.     FLUoxetine  (PROZAC ) 20 MG capsule TAKE 1 CAPSULE BY MOUTH EVERY DAY 90 capsule 3   fluticasone (FLONASE) 50 MCG/ACT nasal spray Place into both nostrils daily.     nitrofurantoin, macrocrystal-monohydrate, (MACROBID) 100 MG capsule Take 100 mg by mouth at bedtime. Take 1 by mouth at bedtime     Omega-3 Fatty Acids (FISH OIL ) 1000 MG CAPS 1 po bid 100 capsule 3   Saccharomyces boulardii (FLORASTART PO) Take 1 capsule by mouth daily as needed.     SYNTHROID  88 MCG tablet TAKE 1 TABLET BY MOUTH EVERY DAY BEFORE BREAKFAST 90 tablet 2   DENTA 5000 PLUS 1.1 % CREA dental cream      estradiol (ESTRACE) 0.1 MG/GM vaginal cream Place 1 Applicatorful vaginally at bedtime. (Patient not  taking: Reported on 09/24/2023)     SYRINGE-NEEDLE, DISP, 3 ML (B-D 3CC LUER-LOK SYR 25GX1) 25G X 1 3 ML MISC USE AS DIRECTED FOR B12 INJECTION ANNUAL APPT DUE IN MUST SEE PROVIDER FOR FUTURE REFILLS 8 each 5   No current facility-administered medications on file prior to visit.    Allergies  Allergen Reactions   Influenza Vaccines     Local reaction   Lexapro  [Escitalopram ]  Withdrawal sx's, cramps   Pravastatin      Cramps, dizziness    Assessment/Plan:  1. Hyperlipidemia -  Dyslipidemia Assessment: LDL-C is above goal on red yeast rice We did discuss that red yeast ice is metabolized into lovastatin which is an FDA approved medication We talked about the safety of supplements and that it is best to use a FDA approved product We discussed PCSK9 inhibitors versus low-dose statins Reviewed her 10-year risk score which is essentially the same with or without her coronary calcium  score Reviewed risks and benefits of treating cholesterol  Plan: Start rosuvastatin  5 mg 3 times a week Follow-up labs in 3 months     Thank you,  Shamirah Ivan D Pride Gonzales, Pharm.JONETTA SARAN, CPP [facility] A Division of [facility] [address] [place], [place] [address]  Phone: [phone]; Fax: [phone]

## 2023-09-24 NOTE — Assessment & Plan Note (Signed)
 Assessment: LDL-C is above goal on red yeast rice We did discuss that red yeast ice is metabolized into lovastatin which is an FDA approved medication We talked about the safety of supplements and that it is best to use a FDA approved product We discussed PCSK9 inhibitors versus low-dose statins Reviewed her 10-year risk score which is essentially the same with or without her coronary calcium  score Reviewed risks and benefits of treating cholesterol  Plan: Start rosuvastatin  5 mg 3 times a week Follow-up labs in 3 months

## 2023-10-02 DIAGNOSIS — Z7982 Long term (current) use of aspirin: Secondary | ICD-10-CM | POA: Diagnosis not present

## 2023-10-02 DIAGNOSIS — S92515A Nondisplaced fracture of proximal phalanx of left lesser toe(s), initial encounter for closed fracture: Secondary | ICD-10-CM | POA: Diagnosis not present

## 2023-11-13 NOTE — Progress Notes (Unsigned)
 No chief complaint on file.   History of Present Illness: 83 yo female with history of CAD (abnormal calcium  score), GERD, HLD, HTN and hypothyroidism who is here today for follow up. She has been followed in our office by Dr. Ronal Ross. Coronary calcium  score of 122 in October 2020. Carotid artery dopplers in May 2025 with stable moderate bilateral disease. Echo October 2022 with normal LV function and trivial MR.   He is here today for follow up. The patient denies any chest pain, dyspnea, palpitations, lower extremity edema, orthopnea, PND, dizziness, near syncope or syncope.    Primary Care Physician: Garald Karlynn GAILS, MD   Past Medical History:  Diagnosis Date   Diverticulosis of colon (without mention of hemorrhage)    GERD (gastroesophageal reflux disease)    Hx of colonic polyp 2001   TUBULAR ADENOMA   Hyperlipidemia    Hypertension    Hypothyroidism    IBS (irritable bowel syndrome)    PVD (peripheral vascular disease) (HCC)    Thyroid  cancer (HCC) 2011    Past Surgical History:  Procedure Laterality Date   ABDOMINAL HYSTERECTOMY  1991   COLONOSCOPY  2010   562.10   ESOPHAGOGASTRODUODENOSCOPY     multiple   THYROIDECTOMY  2010   and 131I treatment at [facility]- Dr. Marolyn    Current Outpatient Medications  Medication Sig Dispense Refill   aspirin 81 MG chewable tablet Chew 81 mg by mouth daily.     atenolol  (TENORMIN ) 25 MG tablet TAKE 1 TABLET BY MOUTH EVERY DAY 90 tablet 3   cholecalciferol (VITAMIN D ) 1000 UNITS tablet Take 1,000 Units by mouth daily.     cyanocobalamin  (VITAMIN B12) 1000 MCG/ML injection GIVE 1000 MCG OF VITAMIN B12 SUBCUTANEOUSLY DAILY X 1 WEEK, THEN WEEKLY FOR 1 MONTH, THEN ONCE EVERY 2 WEEKS 10 mL 5   cyanocobalamin  1000 MCG tablet Take 1,000 mcg by mouth daily.     DENTA 5000 PLUS 1.1 % CREA dental cream      estradiol (ESTRACE) 0.1 MG/GM vaginal cream Place 1 Applicatorful vaginally at bedtime. (Patient not taking: Reported on  09/24/2023)     FLUoxetine  (PROZAC ) 20 MG capsule TAKE 1 CAPSULE BY MOUTH EVERY DAY 90 capsule 3   fluticasone (FLONASE) 50 MCG/ACT nasal spray Place into both nostrils daily.     nitrofurantoin, macrocrystal-monohydrate, (MACROBID) 100 MG capsule Take 100 mg by mouth at bedtime. Take 1 by mouth at bedtime     Omega-3 Fatty Acids (FISH OIL ) 1000 MG CAPS 1 po bid 100 capsule 3   rosuvastatin  (CRESTOR ) 5 MG tablet Take 1 tablet (5 mg total) by mouth every Monday, Wednesday, and Friday. 36 tablet 3   Saccharomyces boulardii (FLORASTART PO) Take 1 capsule by mouth daily as needed.     SYNTHROID  88 MCG tablet TAKE 1 TABLET BY MOUTH EVERY DAY BEFORE BREAKFAST 90 tablet 2   SYRINGE-NEEDLE, DISP, 3 ML (B-D 3CC LUER-LOK SYR 25GX1) 25G X 1 3 ML MISC USE AS DIRECTED FOR B12 INJECTION ANNUAL APPT DUE IN MUST SEE PROVIDER FOR FUTURE REFILLS 8 each 5   No current facility-administered medications for this visit.    Allergies  Allergen Reactions   Influenza Vaccines     Local reaction   Lexapro  [Escitalopram ]     Withdrawal sx's, cramps   Pravastatin      Cramps, dizziness    Social History   Socioeconomic History   Marital status: Married    Spouse name: Fairy  Number of children: 2   Years of education: Not on file   Highest education level: Not on file  Occupational History   Occupation: retired  Tobacco Use   Smoking status: Former    Current packs/day: 0.00    Types: Cigarettes    Quit date: 03/06/1958    Years since quitting: 65.7   Smokeless tobacco: Never  Vaping Use   Vaping status: Never Used  Substance and Sexual Activity   Alcohol use: No   Drug use: No   Sexual activity: Yes  Other Topics Concern   Not on file  Social History Narrative   Retired   Married 2 kids   Regular exercise: not outside the home    Caffeine use: coffee daily   Former smoker, no alcohol, no drugs      Lives with husband-2025   Social Drivers of Health   Financial Resource Strain: Low  Risk  (04/26/2023)   Overall Financial Resource Strain (CARDIA)    Difficulty of Paying Living Expenses: Not very hard  Food Insecurity: No Food Insecurity (04/26/2023)   Hunger Vital Sign    Worried About Running Out of Food in the Last Year: Never true    Ran Out of Food in the Last Year: Never true  Transportation Needs: No Transportation Needs (04/26/2023)   PRAPARE - Administrator, Civil Service (Medical): No    Lack of Transportation (Non-Medical): No  Physical Activity: Inactive (04/26/2023)   Exercise Vital Sign    Days of Exercise per Week: 0 days    Minutes of Exercise per Session: 0 min  Stress: No Stress Concern Present (04/26/2023)   [employer] of Occupational Health - Occupational Stress Questionnaire    Feeling of Stress : Not at all  Social Connections: Socially Integrated (04/26/2023)   Social Connection and Isolation Panel    Frequency of Communication with Friends and Family: More than three times a week    Frequency of Social Gatherings with Friends and Family: More than three times a week    Attends Religious Services: More than 4 times per year    Active Member of [employer] or Organizations: Yes    Attends Banker Meetings: Never    Marital Status: Married  Catering manager Violence: Not At Risk (04/26/2023)   Humiliation, Afraid, Rape, and Kick questionnaire    Fear of Current or Ex-Partner: No    Emotionally Abused: No    Physically Abused: No    Sexually Abused: No    Family History  Problem Relation Age of Onset   Hyperlipidemia Mother    Heart disease Mother    Hypertension Other    Colon cancer Paternal Aunt 33   Colon cancer Son 67   Colon polyps Neg Hx    Liver cancer Neg Hx    Pancreatic cancer Neg Hx    Rectal cancer Neg Hx    Stomach cancer Neg Hx     Review of Systems:  As stated in the HPI and otherwise negative.   There were no vitals taken for this visit.  Physical Examination: General: Well developed, well  nourished, NAD  HEENT: OP clear, mucus membranes moist  SKIN: warm, dry. No rashes. Neuro: No focal deficits  Musculoskeletal: Muscle strength 5/5 all ext  Psychiatric: Mood and affect normal  Neck: No JVD, no carotid bruits, no thyromegaly, no lymphadenopathy.  Lungs:Clear bilaterally, no wheezes, rhonci, crackles Cardiovascular: Regular rate and rhythm. No murmurs, gallops or rubs. Abdomen:Soft.  Bowel sounds present. Non-tender.  Extremities: No lower extremity edema. Pulses are 2 + in the bilateral DP/PT.  EKG:  EKG is *** ordered today. The ekg ordered today demonstrates   Recent Labs: 12/12/2022: ALT 16; BUN 10; Creatinine, Ser 0.79; Hemoglobin 13.3; Platelets 186.0; Potassium 3.9; Sodium 137 02/19/2023: TSH 2.42   Lipid Panel    Component Value Date/Time   CHOL 201 (H) 07/12/2022 1112   TRIG 132.0 07/12/2022 1112   HDL 47.60 07/12/2022 1112   CHOLHDL 4 07/12/2022 1112   VLDL 26.4 07/12/2022 1112   LDLCALC 127 (H) 07/12/2022 1112   LDLDIRECT 137.0 06/14/2021 1146     Wt Readings from Last 3 Encounters:  07/02/23 152 lb (68.9 kg)  04/26/23 150 lb (68 kg)  02/19/23 154 lb 3.2 oz (69.9 kg)    Assessment and Plan:   1. CAD without angina: She was found to have CAD with an abnormal coronary calcium  score. She is statin intolerant. No chest pain. Continue ASA and low dose Crestor . She has been seen in the PharmD lipid clinic.   2. Carotid artery disease: Moderate disease by doppler May 2025.   3. HTN: BP is controlled. Continue current therapy  Labs/ tests ordered today include:   No orders of the defined types were placed in this encounter.  Disposition:   F/U with me in 12 months   Signed, Lonni Cash, MD, [facility] 11/13/2023 3:36 PM    [facility] [address] [place], [place], [place]  [address] Phone: [phone]; Fax: [phone]

## 2023-11-14 ENCOUNTER — Ambulatory Visit: Attending: Cardiovascular Disease | Admitting: Cardiovascular Disease

## 2023-11-14 ENCOUNTER — Encounter: Payer: Self-pay | Admitting: Cardiovascular Disease

## 2023-11-14 VITALS — BP 130/60 | HR 65 | Ht 65.0 in | Wt 151.0 lb

## 2023-11-14 DIAGNOSIS — I6523 Occlusion and stenosis of bilateral carotid arteries: Secondary | ICD-10-CM

## 2023-11-14 DIAGNOSIS — I2510 Atherosclerotic heart disease of native coronary artery without angina pectoris: Secondary | ICD-10-CM

## 2023-11-14 DIAGNOSIS — I10 Essential (primary) hypertension: Secondary | ICD-10-CM | POA: Diagnosis not present

## 2023-11-14 DIAGNOSIS — E785 Hyperlipidemia, unspecified: Secondary | ICD-10-CM | POA: Diagnosis not present

## 2023-11-14 NOTE — Patient Instructions (Addendum)
 Medication Instructions:  Your physician recommends that you continue on your current medications as directed. Please refer to the Current Medication list given to you today.  *If you need a refill on your cardiac medications before your next appointment, please call your pharmacy*  Lab Work: None.  If you have labs (blood work) drawn today and your tests are completely normal, you will receive your results only by: MyChart Message (if you have MyChart) OR A paper copy in the mail If you have any lab test that is abnormal or we need to change your treatment, we will call you to review the results.  Testing/Procedures: None.  Follow-Up: At [facility], you and your health needs are our priority.  As part of our continuing mission to provide you with exceptional heart care, our providers are all part of one team.  This team includes your primary Cardiologist (physician) and Advanced Practice Providers or APPs (Physician Assistants and Nurse Practitioners) who all work together to provide you with the care you need, when you need it.  Your next appointment:   1 year(s)  Provider:    Lonni Cash, MD   We recommend signing up for the patient portal called MyChart.  Sign up information is provided on this After Visit Summary.  MyChart is used to connect with patients for Virtual Visits (Telemedicine).  Patients are able to view lab/test results, encounter notes, upcoming appointments, etc.  Non-urgent messages can be sent to your provider as well.   To learn more about what you can do with MyChart, go to [email or web address].   Other Instructions Please make an appointment with our lipid clinic as soon as you can within the next 6 weeks to discuss starting Repatha.

## 2023-12-10 ENCOUNTER — Other Ambulatory Visit: Payer: Self-pay | Admitting: Internal Medicine

## 2023-12-17 ENCOUNTER — Ambulatory Visit: Admitting: Internal Medicine

## 2023-12-17 ENCOUNTER — Encounter: Payer: Self-pay | Admitting: Internal Medicine

## 2023-12-17 VITALS — BP 122/78 | HR 76 | Temp 97.1°F | Resp 18 | Ht 65.0 in | Wt 151.0 lb

## 2023-12-17 DIAGNOSIS — R350 Frequency of micturition: Secondary | ICD-10-CM | POA: Insufficient documentation

## 2023-12-17 LAB — POCT URINALYSIS DIPSTICK
Bilirubin, UA: NEGATIVE
Glucose, UA: NEGATIVE
Ketones, UA: NEGATIVE
Leukocytes, UA: NEGATIVE
Nitrite, UA: NEGATIVE
Protein, UA: NEGATIVE
Spec Grav, UA: 1.02 (ref 1.010–1.025)
Urobilinogen, UA: 0.2 U/dL
pH, UA: 6.5 (ref 5.0–8.0)

## 2023-12-17 NOTE — Progress Notes (Signed)
 Office Visit  Subjective   Patient ID: PIETRINA JAGODZINSKI   DOB: [birth year]   Age: 83 y.o.   MRN: [id number]   Chief Complaint Chief Complaint  Patient presents with   Urinary Frequency     History of Present Illness Mrs. Munns is a 83 yo female who comes in today with foul smelling urine which started a week ago.  She states she had some mild dysuria with urinary frequency but no worsening.  There is no fevers, chills, nausea, vomiting, abdominal pain, hematuria, flank pain or other problems.  She states she drinks plenty of water.     Past Medical History Past Medical History:  Diagnosis Date   Diverticulosis of colon (without mention of hemorrhage)    GERD (gastroesophageal reflux disease)    Hx of colonic polyp 2001   TUBULAR ADENOMA   Hyperlipidemia    Hypertension    Hypothyroidism    IBS (irritable bowel syndrome)    PVD (peripheral vascular disease)    Thyroid  cancer (HCC) 2011     Allergies Allergies  Allergen Reactions   Influenza Vaccines     Local reaction   Lexapro  [Escitalopram ]     Withdrawal sx's, cramps   Pravastatin      Cramps, dizziness     Medications  Current Outpatient Medications:    aspirin 81 MG chewable tablet, Chew 81 mg by mouth daily., Disp: , Rfl:    atenolol  (TENORMIN ) 25 MG tablet, TAKE 1 TABLET BY MOUTH EVERY DAY, Disp: 90 tablet, Rfl: 3   cholecalciferol (VITAMIN D ) 1000 UNITS tablet, Take 1,000 Units by mouth daily., Disp: , Rfl:    cyanocobalamin  (VITAMIN B12) 1000 MCG/ML injection, GIVE 1000 MCG OF VITAMIN B12 SUBCUTANEOUSLY DAILY X 1 WEEK, THEN WEEKLY FOR 1 MONTH, THEN ONCE EVERY 2 WEEKS, Disp: 10 mL, Rfl: 5   DENTA 5000 PLUS 1.1 % CREA dental cream, , Disp: , Rfl:    FLUoxetine  (PROZAC ) 20 MG capsule, TAKE 1 CAPSULE BY MOUTH EVERY DAY, Disp: 90 capsule, Rfl: 3   fluticasone (FLONASE) 50 MCG/ACT nasal spray, Place into both nostrils daily., Disp: , Rfl:    nitrofurantoin, macrocrystal-monohydrate, (MACROBID) 100 MG capsule, Take  100 mg by mouth at bedtime. Take 1 by mouth at bedtime, Disp: , Rfl:    Omega-3 Fatty Acids (FISH OIL ) 1000 MG CAPS, 1 po bid, Disp: 100 capsule, Rfl: 3   OVER THE COUNTER MEDICATION, , Disp: , Rfl:    SYNTHROID  88 MCG tablet, TAKE 1 TABLET BY MOUTH EVERY DAY BEFORE BREAKFAST, Disp: 90 tablet, Rfl: 2   SYRINGE-NEEDLE, DISP, 3 ML (B-D 3CC LUER-LOK SYR 25GX1) 25G X 1 3 ML MISC, USE AS DIRECTED FOR B12 INJECTION ANNUAL APPT DUE IN MUST SEE PROVIDER FOR FUTURE REFILLS, Disp: 8 each, Rfl: 5   Review of Systems Review of Systems  Constitutional:  Negative for chills and fever.  Respiratory:  Negative for shortness of breath.   Cardiovascular:  Negative for chest pain.  Gastrointestinal:  Negative for abdominal pain, constipation, diarrhea, nausea and vomiting.  Genitourinary:  Positive for frequency. Negative for dysuria, flank pain, hematuria and urgency.  Skin:  Negative for itching and rash.  Neurological:  Negative for dizziness, weakness and headaches.       Objective:    Vitals BP 122/78   Pulse 76   Temp (!) 97.1 F (36.2 C)   Resp 18   Ht 5' 5 (1.651 m)   Wt 151 lb (68.5 kg)   SpO2  96%   BMI 25.13 kg/m    Physical Examination Physical Exam Constitutional:      Appearance: Normal appearance. She is not ill-appearing.  Cardiovascular:     Rate and Rhythm: Normal rate and regular rhythm.     Pulses: Normal pulses.     Heart sounds: No murmur heard.    No friction rub. No gallop.  Pulmonary:     Effort: Pulmonary effort is normal. No respiratory distress.     Breath sounds: No wheezing, rhonchi or rales.  Abdominal:     General: Bowel sounds are normal. There is no distension.     Palpations: Abdomen is soft.     Tenderness: There is no abdominal tenderness.  Musculoskeletal:     Right lower leg: No edema.     Left lower leg: No edema.  Skin:    General: Skin is warm and dry.     Findings: No rash.  Neurological:     Mental Status: She is alert.         Assessment & Plan:   No problem-specific Assessment & Plan notes found for this encounter.    No follow-ups on file.   Selinda Fleeta Finger, MD

## 2023-12-17 NOTE — Assessment & Plan Note (Signed)
 Her UA was normal.  We will continue to monitor.

## 2023-12-26 ENCOUNTER — Ambulatory Visit: Admitting: Pharmacist

## 2024-01-19 LAB — LIPID PANEL
Chol/HDL Ratio: 3 ratio (ref 0.0–4.4)
Cholesterol, Total: 137 mg/dL (ref 100–199)
HDL: 46 mg/dL (ref >39)
LDL Chol Calc (NIH): 71 mg/dL (ref 0–99)
Triglycerides: 107 mg/dL (ref 0–149)
VLDL Cholesterol Cal: 20 mg/dL (ref 5–40)

## 2024-01-19 LAB — HEPATIC FUNCTION PANEL
ALT: 20 IU/L (ref 0–32)
AST: 26 IU/L (ref 0–40)
Albumin: 4.5 g/dL (ref 3.7–4.7)
Alkaline Phosphatase: 52 IU/L (ref 48–129)
Bilirubin Total: 0.7 mg/dL (ref 0.0–1.2)
Bilirubin, Direct: 0.24 mg/dL (ref 0.00–0.40)
Total Protein: 6.5 g/dL (ref 6.0–8.5)

## 2024-01-20 ENCOUNTER — Ambulatory Visit: Payer: Self-pay | Admitting: Pharmacist

## 2024-01-28 NOTE — Progress Notes (Unsigned)
 Patient ID: Lauren Mcgee                 DOB: 31-Jul-1940                   MRN: [id number]      HPI: Lauren Mcgee is a 83 y.o. female patient referred to lipid clinic by Dr. Verlin. PMH is significant for CAD (abnormal calcium  score), GERD, HLD and hypothyroidism. Coronary calcium  score of 122 in October 2020 (57th percentile). Carotid artery dopplers in November 2020 with moderate bilateral disease.   09/24/2023 Patient presented to lipid clinic. She reported that she has been taking red yeast rice for maybe 10 years. Denies taking it at the same time as FDA approved statins. She is also taking cod liver oil which I recommended against.  We reviewed time to benefit data with statins along with her Mesa risk score. MESA risk score 11%.  We discussed the benefits and risks of treating cholesterol.  We did review PCSK9 inhibitors but also reviewed possible statin options. Red yeast rice was stopped, rosuvastatin  5 mg three times a week was started.  Per note from Dr. Verlin in Sept, patient did not tolerate and was referred to discuss Repatha. Labs appear she may still be taking rosuvastatin . Will discuss with patient. (LDL 71 on 11/14, was 127 in may 2024).   Patient presents today to discuss lipids. After her visit with Dr.McAlhany in September, she decided to start taking rosuvastatin  3 times weekly again, as reflected by her labs on 11/14 (LDL 71). She is still concerned about taking statins because her friend had a stroke after taking one. We explained to her that statins do not guarantee that you will not have a stroke or heart attack, but they do decrease your risk. We dicussed Repatha and that Repatha was studied with a statin. We talked about possible side effects with statins. Her father experienced muscle pain on a statin, but switched to a different statin and had no issues. The patient does not exercise other than doing laundry at home. She and her husband discussed starting to walk their  dog, Pepper together.     Current Medications: rosuvastatin  5 mg 3 times weekly Intolerances: pravastatin  40mg  (cramps), rosuvastatin  10mg  daily Risk Factors: Coronary calcium  score of 112, Mesa risk score 11%, age LDL-C goal: Less than 70 ApoB goal: Less than 80  Diet:  Breakfast: oatmeal, eggs and bacon (if she eats out), toast and jelly Lunch: banana sandwich, pimento cheese, salad  Dinner: Roast, chicken, rarely hamburger, vegetables (beans) Snack: pretzels, chips  Water  Exercise:  Does not exercise regularly  Family History: father: stroke 21's Mother: heart attack 86's Both parents lived until the 18s  Social History:  Social History   Socioeconomic History   Marital status: Married    Spouse name: Fairy   Number of children: 2   Years of education: Not on file   Highest education level: Not on file  Occupational History   Occupation: retired  Tobacco Use   Smoking status: Former    Current packs/day: 0.00    Types: Cigarettes    Quit date: 03/06/1958    Years since quitting: 65.9   Smokeless tobacco: Never  Vaping Use   Vaping status: Never Used  Substance and Sexual Activity   Alcohol use: No   Drug use: No   Sexual activity: Yes  Other Topics Concern   Not on file  Social History Narrative  Retired   Married 2 kids   Regular exercise: not outside the home    Caffeine use: coffee daily   Former smoker, no alcohol, no drugs      Lives with husband-2025   Social Drivers of Health   Financial Resource Strain: Low Risk  (04/26/2023)   Overall Financial Resource Strain (CARDIA)    Difficulty of Paying Living Expenses: Not very hard  Food Insecurity: No Food Insecurity (04/26/2023)   Hunger Vital Sign    Worried About Running Out of Food in the Last Year: Never true    Ran Out of Food in the Last Year: Never true  Transportation Needs: No Transportation Needs (04/26/2023)   PRAPARE - Administrator, Civil Service (Medical): No    Lack  of Transportation (Non-Medical): No  Physical Activity: Inactive (04/26/2023)   Exercise Vital Sign    Days of Exercise per Week: 0 days    Minutes of Exercise per Session: 0 min  Stress: No Stress Concern Present (04/26/2023)   [employer] of Occupational Health - Occupational Stress Questionnaire    Feeling of Stress : Not at all  Social Connections: Socially Integrated (04/26/2023)   Social Connection and Isolation Panel    Frequency of Communication with Friends and Family: More than three times a week    Frequency of Social Gatherings with Friends and Family: More than three times a week    Attends Religious Services: More than 4 times per year    Active Member of [employer] or Organizations: Yes    Attends Banker Meetings: Never    Marital Status: Married  Catering Manager Violence: Not At Risk (04/26/2023)   Humiliation, Afraid, Rape, and Kick questionnaire    Fear of Current or Ex-Partner: No    Emotionally Abused: No    Physically Abused: No    Sexually Abused: No     Labs: Lipid Panel     Component Value Date/Time   CHOL 137 01/18/2024 1058   TRIG 107 01/18/2024 1058   HDL 46 01/18/2024 1058   CHOLHDL 3.0 01/18/2024 1058   CHOLHDL 4 07/12/2022 1112   VLDL 26.4 07/12/2022 1112   LDLCALC 71 01/18/2024 1058   LDLDIRECT 137.0 06/14/2021 1146   LABVLDL 20 01/18/2024 1058    Past Medical History:  Diagnosis Date   Diverticulosis of colon (without mention of hemorrhage)    GERD (gastroesophageal reflux disease)    Hx of colonic polyp 2001   TUBULAR ADENOMA   Hyperlipidemia    Hypertension    Hypothyroidism    IBS (irritable bowel syndrome)    PVD (peripheral vascular disease)    Thyroid  cancer (HCC) 2011    Current Outpatient Medications on File Prior to Visit  Medication Sig Dispense Refill   aspirin 81 MG chewable tablet Chew 81 mg by mouth daily.     atenolol  (TENORMIN ) 25 MG tablet TAKE 1 TABLET BY MOUTH EVERY DAY 90 tablet 3    cholecalciferol (VITAMIN D ) 1000 UNITS tablet Take 1,000 Units by mouth daily.     cyanocobalamin  (VITAMIN B12) 1000 MCG/ML injection GIVE 1000 MCG OF VITAMIN B12 SUBCUTANEOUSLY DAILY X 1 WEEK, THEN WEEKLY FOR 1 MONTH, THEN ONCE EVERY 2 WEEKS 10 mL 5   DENTA 5000 PLUS 1.1 % CREA dental cream      FLUoxetine  (PROZAC ) 20 MG capsule TAKE 1 CAPSULE BY MOUTH EVERY DAY 90 capsule 3   fluticasone (FLONASE) 50 MCG/ACT nasal spray Place into both nostrils daily.  nitrofurantoin, macrocrystal-monohydrate, (MACROBID) 100 MG capsule Take 100 mg by mouth at bedtime. Take 1 by mouth at bedtime     Omega-3 Fatty Acids (FISH OIL ) 1000 MG CAPS 1 po bid 100 capsule 3   OVER THE COUNTER MEDICATION      SYNTHROID  88 MCG tablet TAKE 1 TABLET BY MOUTH EVERY DAY BEFORE BREAKFAST 90 tablet 2   SYRINGE-NEEDLE, DISP, 3 ML (B-D 3CC LUER-LOK SYR 25GX1) 25G X 1 3 ML MISC USE AS DIRECTED FOR B12 INJECTION ANNUAL APPT DUE IN MUST SEE PROVIDER FOR FUTURE REFILLS 8 each 5   No current facility-administered medications on file prior to visit.    Allergies  Allergen Reactions   Influenza Vaccines     Local reaction   Lexapro  [Escitalopram ]     Withdrawal sx's, cramps   Pravastatin      Cramps, dizziness    Assessment/Plan:  1. Hyperlipidemia -  Dyslipidemia Assessment: -Patient is close to LDL goal taking rosuvastatin  5 mg three times per week.  -The patient does not exercise currently  Plan: -Continue rosuvastatin  5 mg three times weekly, increase to every other day if comfortable.  -Start walking with dog.   Follow-up: -Lipid panel at the end of February/beginning of march.  -Call with any questions or concerns  Thank you,  Lum Ricks, PharmD Candidate  [facility]    Eleanor JONETTA Crews, Pharm.JONETTA SARAN, CPP [facility] [address], [place], [place] [address]  Phone: [phone]; Fax: [phone]

## 2024-01-29 ENCOUNTER — Ambulatory Visit: Admitting: Pharmacist

## 2024-01-29 DIAGNOSIS — E785 Hyperlipidemia, unspecified: Secondary | ICD-10-CM

## 2024-01-29 MED ORDER — ROSUVASTATIN CALCIUM 5 MG PO TABS: 5.0000 mg | ORAL_TABLET | ORAL | 3 refills | Status: AC

## 2024-01-29 NOTE — Assessment & Plan Note (Signed)
 Assessment: -Patient is close to LDL goal taking rosuvastatin  5 mg three times per week.  -The patient does not exercise currently  Plan: -Continue rosuvastatin  5 mg three times weekly, increase to every other day if comfortable.  -Start walking with dog.   Follow-up: -Lipid panel at the end of February/beginning of march.  -Call with any questions or concerns

## 2024-01-29 NOTE — Patient Instructions (Addendum)
 Thank you for coming to see us  today!  -Continue to take rosuvastatin  5 mg, 3 times weekly. You can start taking it every other day if you feel comfortable.  -Start walking with your dog if you can.  -Follow-up after lipid panel in 3 months.  -Call or send a message if you have any questions

## 2024-02-21 ENCOUNTER — Ambulatory Visit: Payer: Medicare Other | Admitting: Internal Medicine

## 2024-02-21 ENCOUNTER — Encounter: Payer: Self-pay | Admitting: Internal Medicine

## 2024-02-21 ENCOUNTER — Other Ambulatory Visit

## 2024-02-21 VITALS — BP 112/60 | HR 67 | Resp 16 | Ht 65.0 in | Wt 153.2 lb

## 2024-02-21 DIAGNOSIS — Z85850 Personal history of malignant neoplasm of thyroid: Secondary | ICD-10-CM

## 2024-02-21 DIAGNOSIS — E890 Postprocedural hypothyroidism: Secondary | ICD-10-CM

## 2024-02-21 NOTE — Patient Instructions (Signed)
 Please continue Synthroid 88 mcg daily.  Take the thyroid hormone every day, with water, at least 30 minutes before breakfast, separated by at least 4 hours from: - acid reflux medications - calcium - iron - multivitamins  Please stop at the lab.  Please come back for a follow-up appointment in 1 year.

## 2024-02-21 NOTE — Progress Notes (Signed)
 Patient ID: Lauren Mcgee, female   DOB: [birth year], 83 y.o.   MRN: [id number]  HPI  Lauren Mcgee is a 83 y.o.-year-old female, returning for f/u for follicular and papillary thyroid  cancer, in remission, and postsurgical hypothyroidism. Last visit 1 year ago.  Interim history: She feels well, w/o complaints today. She previously had some dysphagia, but not anymore.  Also, she did not have a return of her heat intolerance. She recently started a statin drug.   Reviewed and addended history: Thyroid  cancer  - dx'ed in 2010.  Thyroidectomy  on 12/07/2009 (Dr Delon Elder) for a thyroid  tumor >> ThyCA: Minimally invasive follicular carcinoma of the right thyroid  lobe, measuring 2.7 cm, and multifocal papillary microcarcinoma bilaterally. No nodes sampled. One of the papillary tumors was approaching and may have involved the surgical resection margin. There was minimal extra thyroid  though invasion of one of the papillary microcarcinoma's, but no lymphovascular invasion.  RAI tx with 154 mCi I-131 on 03/16/2010. At that time TSH was >100, thyroglobulin was 0.7, ATA<20  Post treatment WBS did not show metastatic disease. There were foci of activity which were previously seen on the pretherapy scan. No new lesions identified.  Neck U/S 12/09/2012: Post thyroidectomy. No evidence of residual thyroid  tissue or mass/nodule at the thyroid  bed.  CT chest 06/27/2019: Lungs/Pleura: Again noted is a slightly irregular nodule in the superior segment of the right lower lobe with irregular margins. It appears essentially unchanged in size measuring approximately 6.7 mm maximum diameter. There is new posterior pleural thickening in the right hemithorax best seen on image 59 of series 6. There is a new faint ground-glass 4 mm nodule in the right upper lobe seen on image 58 of series 6 and image 33 of series 3. The lungs are otherwise clear.  CT chest 02/03/2020:  1. Unchanged 7 mm nodule in the superior  segment of the right lower lobe. No further follow-up required. 2. Additional 4 mm ground-glass nodule in the right upper lobe is also unchanged since October 2020. No further follow-up recommended.  Reviewed her thyroglobulin and ATA levels: Lab Results  Component Value Date   THYROGLB <0.1 (L) 02/19/2023   THYROGLB <0.1 (L) 04/12/2022   THYROGLB <0.1 (L) 03/03/2021   THYROGLB 0.1 (L) 12/16/2019   THYROGLB <0.1 (L) 01/23/2019   THYROGLB 0.1 (L) 01/11/2018   THYROGLB 0.1 (L) 12/06/2016   THYROGLB <0.1 (L) 10/12/2015   THYROGLB <0.1 (L) 10/12/2014   THYROGLB <0.2 06/24/2013   THYROGLB <0.2 12/02/2012   THGAB <1 02/19/2023   THGAB <1 04/12/2022   THGAB <1 03/03/2021   THGAB <1 12/16/2019   THGAB <1 01/23/2019   THGAB <1 01/11/2018   THGAB <1 12/06/2016   THGAB <1 10/12/2015   THGAB <1 10/12/2014   THGAB <20.0 06/24/2013   THGAB <20.0 12/02/2012   Postsurgical hypothyroidism:  At last visit she was on Synthroid  100 mcg (increased from 88 mcg 02/2021), but we had to decrease the dose due to suppressed TSH.  She is now back on 88 mcg daily: - in am - fasting - 1h from b'fast - no Ca, Fe, MVI - off and on PPIs - later in the day - not on Biotin  Reviewed her TFTs: Lab Results  Component Value Date   TSH 2.42 02/19/2023   TSH 0.57 12/12/2022   TSH 0.84 04/12/2022   TSH 0.16 (L) 02/20/2022   TSH 0.45 06/14/2021   TSH 1.26 04/11/2021   TSH 7.97 (H) 03/03/2021  TSH 5.62 (H) 09/15/2020   TSH 11.76 (H) 03/30/2020   TSH 5.42 (H) 12/16/2019   FREET4 1.15 04/12/2022   FREET4 1.09 04/11/2021   FREET4 1.01 03/03/2021   FREET4 0.91 04/01/2020   FREET4 0.70 12/16/2019   FREET4 0.75 06/12/2019   FREET4 0.94 04/15/2019   FREET4 1.08 03/12/2019   FREET4 1.09 01/23/2019   FREET4 1.05 12/06/2018   Pt denies: - feeling nodules in neck - hoarseness - choking - SOB with lying down She had chronic dysphagia, which started before her surgery >> resolved.  She continues  atenolol  per cardiology. She also has a history of HTN, HL (Crestor  >> fatigue), PVD, GERD.   ROS: + see HPI  I reviewed pt's medications, allergies, PMH, social hx, family hx, and changes were documented in the history of present illness. Otherwise, unchanged from my initial visit note.  Past Medical History:  Diagnosis Date   Diverticulosis of colon (without mention of hemorrhage)    GERD (gastroesophageal reflux disease)    Hx of colonic polyp 2001   TUBULAR ADENOMA   Hyperlipidemia    Hypertension    Hypothyroidism    IBS (irritable bowel syndrome)    PVD (peripheral vascular disease)    Thyroid  cancer (HCC) 2011   Past Surgical History:  Procedure Laterality Date   ABDOMINAL HYSTERECTOMY  1991   COLONOSCOPY  2010   562.10   ESOPHAGOGASTRODUODENOSCOPY     multiple   THYROIDECTOMY  2010   and 131I treatment at [facility]- Dr. Marolyn   Social History   Socioeconomic History   Marital status: Married    Spouse name: Fairy   Number of children: 2   Years of education: Not on file   Highest education level: Not on file  Occupational History   Occupation: retired  Tobacco Use   Smoking status: Former    Current packs/day: 0.00    Average packs/day: 0.5 packs/day    Types: Cigarettes    Quit date: 03/06/1958    Years since quitting: 66.0   Smokeless tobacco: Never  Vaping Use   Vaping status: Never Used  Substance and Sexual Activity   Alcohol use: No   Drug use: No   Sexual activity: Yes  Other Topics Concern   Not on file  Social History Narrative   Retired   Married 2 kids   Regular exercise: not outside the home    Caffeine use: coffee daily   Former smoker, no alcohol, no drugs      Lives with husband-2025   Social Drivers of Health   Tobacco Use: Medium Risk (12/17/2023)   Patient History    Smoking Tobacco Use: Former    Smokeless Tobacco Use: Never    Passive Exposure: Not on Actuary Strain: Low Risk (04/26/2023)   Overall  Financial Resource Strain (CARDIA)    Difficulty of Paying Living Expenses: Not very hard  Food Insecurity: No Food Insecurity (04/26/2023)   Hunger Vital Sign    Worried About Running Out of Food in the Last Year: Never true    Ran Out of Food in the Last Year: Never true  Transportation Needs: No Transportation Needs (04/26/2023)   PRAPARE - Administrator, Civil Service (Medical): No    Lack of Transportation (Non-Medical): No  Physical Activity: Inactive (04/26/2023)   Exercise Vital Sign    Days of Exercise per Week: 0 days    Minutes of Exercise per Session: 0 min  Stress: No  Stress Concern Present (04/26/2023)   [employer] of Occupational Health - Occupational Stress Questionnaire    Feeling of Stress : Not at all  Social Connections: Socially Integrated (04/26/2023)   Social Connection and Isolation Panel    Frequency of Communication with Friends and Family: More than three times a week    Frequency of Social Gatherings with Friends and Family: More than three times a week    Attends Religious Services: More than 4 times per year    Active Member of [employer] or Organizations: Yes    Attends Banker Meetings: Never    Marital Status: Married  Catering Manager Violence: Not At Risk (04/26/2023)   Humiliation, Afraid, Rape, and Kick questionnaire    Fear of Current or Ex-Partner: No    Emotionally Abused: No    Physically Abused: No    Sexually Abused: No  Depression (PHQ2-9): Low Risk (04/26/2023)   Depression (PHQ2-9)    PHQ-2 Score: 1  Alcohol Screen: Low Risk (04/26/2023)   Alcohol Screen    Last Alcohol Screening Score (AUDIT): 0  Housing: Unknown (04/26/2023)   Housing Stability Vital Sign    Unable to Pay for Housing in the Last Year: No    Number of Times Moved in the Last Year: Not on file    Homeless in the Last Year: No  Utilities: Not At Risk (04/26/2023)   AHC Utilities    Threatened with loss of utilities: No  Health Literacy:  Adequate Health Literacy (04/26/2023)   B1300 Health Literacy    Frequency of need for help with medical instructions: Never   Current Outpatient Medications on File Prior to Visit  Medication Sig Dispense Refill   aspirin 81 MG chewable tablet Chew 81 mg by mouth daily.     atenolol  (TENORMIN ) 25 MG tablet TAKE 1 TABLET BY MOUTH EVERY DAY 90 tablet 3   cholecalciferol (VITAMIN D ) 1000 UNITS tablet Take 1,000 Units by mouth daily.     cyanocobalamin  (VITAMIN B12) 1000 MCG/ML injection GIVE 1000 MCG OF VITAMIN B12 SUBCUTANEOUSLY DAILY X 1 WEEK, THEN WEEKLY FOR 1 MONTH, THEN ONCE EVERY 2 WEEKS 10 mL 5   DENTA 5000 PLUS 1.1 % CREA dental cream      FLUoxetine  (PROZAC ) 20 MG capsule TAKE 1 CAPSULE BY MOUTH EVERY DAY 90 capsule 3   fluticasone (FLONASE) 50 MCG/ACT nasal spray Place into both nostrils daily.     nitrofurantoin, macrocrystal-monohydrate, (MACROBID) 100 MG capsule Take 100 mg by mouth at bedtime. Take 1 by mouth at bedtime     Omega-3 Fatty Acids (FISH OIL ) 1000 MG CAPS 1 po bid 100 capsule 3   OVER THE COUNTER MEDICATION      rosuvastatin  (CRESTOR ) 5 MG tablet Take 1 tablet (5 mg total) by mouth every other day. 45 tablet 3   SYNTHROID  88 MCG tablet TAKE 1 TABLET BY MOUTH EVERY DAY BEFORE BREAKFAST 90 tablet 2   SYRINGE-NEEDLE, DISP, 3 ML (B-D 3CC LUER-LOK SYR 25GX1) 25G X 1 3 ML MISC USE AS DIRECTED FOR B12 INJECTION ANNUAL APPT DUE IN MUST SEE PROVIDER FOR FUTURE REFILLS 8 each 5   No current facility-administered medications on file prior to visit.   Allergies  Allergen Reactions   Influenza Vaccines     Local reaction   Lexapro  [Escitalopram ]     Withdrawal sx's, cramps   Pravastatin      Cramps, dizziness   Family History  Problem Relation Age of Onset   Hyperlipidemia Mother  Heart disease Mother    Hypertension Other    Colon cancer Paternal Aunt 32   Colon cancer Son 85   Colon polyps Neg Hx    Liver cancer Neg Hx    Pancreatic cancer Neg Hx    Rectal  cancer Neg Hx    Stomach cancer Neg Hx    PE: BP 112/60   Pulse 67   Resp 16   Ht 5' 5 (1.651 m)   Wt 153 lb 3.2 oz (69.5 kg)   SpO2 98%   BMI 25.49 kg/m   Wt Readings from Last 3 Encounters:  02/21/24 153 lb 3.2 oz (69.5 kg)  12/17/23 151 lb (68.5 kg)  11/14/23 151 lb (68.5 kg)   Constitutional: Normal weight, in NAD Eyes:  EOMI, no exophthalmos ENT: no neck masses, no cervical lymphadenopathy Cardiovascular: RRR, No MRG Respiratory: CTA B Musculoskeletal: no deformities Skin:no rashes Neurological: + tremor with outstretched hands  ASSESSMENT: 1. Hypothyroidism  2. History of thyroid  cancer  PLAN:  1. Patient with postsurgical hypothyroidism, on Synthroid  d.a.w. - latest thyroid  labs reviewed with pt. >> normal: Lab Results  Component Value Date   TSH 2.42 02/19/2023  - she continues on Synthroid  88 mcg daily, dose decreased 02/2023 - pt feels good on this dose.  Her heat intolerance improved after reducing Synthroid  dose - we discussed about taking the thyroid  hormone every day, with water, >30 minutes before breakfast, separated by >4 hours from acid reflux medications, calcium , iron, multivitamins. Pt. is taking it correctly. - will check thyroid  tests today: TSH and fT4 - If labs are abnormal, she will need to return for repeat TFTs in 1.5 months - OTW, I will see her back in a year  2. PTC - In remission - Reviewed records available from Dr. Marolyn - She has a history of lung nodules that appears to be stable between 2011 and 2021 per review of the chest CT scans - Her thyroglobulin level is fluctuating between 0.1 and undetectable.  We discussed that this is most likely due to assay variability.  Latest thyroglobulin and antibodies were undetectable - We will repeat these today - No further ultrasounds needed unless thyroglobulin starts to increase or masses are felt on palpation of her neck -I will see her back in 1 year  Needs refills.  Orders Placed  This Encounter  Procedures   TSH   T4, free   Thyroglobulin Level   Thyroglobulin antibody   Lela Fendt, MD PhD [facility] Endocrinology

## 2024-02-22 ENCOUNTER — Ambulatory Visit: Payer: Self-pay | Admitting: Internal Medicine

## 2024-02-25 LAB — THYROGLOBULIN LEVEL: Thyroglobulin: <0.1 ng/mL — ABNORMAL LOW

## 2024-02-25 LAB — TSH: TSH: 1.04 m[IU]/L (ref 0.40–4.50)

## 2024-02-25 LAB — THYROGLOBULIN ANTIBODY: Thyroglobulin Ab: <1 [IU]/mL

## 2024-02-25 LAB — T4, FREE: Free T4: 1.3 ng/dL (ref 0.8–1.8)

## 2024-02-26 MED ORDER — SYNTHROID 88 MCG PO TABS: 88.0000 ug | ORAL_TABLET | Freq: Every day | ORAL | 3 refills | Status: AC

## 2024-02-26 NOTE — Addendum Note (Signed)
 Addended by: TRIXIE FILE on: 02/26/2024 02:44 PM   Modules accepted: Orders

## 2024-03-27 ENCOUNTER — Encounter: Payer: Self-pay | Admitting: Gastroenterology

## 2024-04-22 ENCOUNTER — Ambulatory Visit: Admitting: Gastroenterology

## 2024-04-23 ENCOUNTER — Ambulatory Visit: Admitting: Gastroenterology

## 2024-05-30 ENCOUNTER — Ambulatory Visit: Admitting: Internal Medicine

## 2025-02-19 ENCOUNTER — Ambulatory Visit: Admitting: Internal Medicine
